# Patient Record
Sex: Female | Born: 1950 | Race: White | Hispanic: No | Marital: Single | Smoking: Former smoker
Health system: Southern US, Community
[De-identification: ages and names within clinical notes are randomized; demographics above are authoritative.]

## PROBLEM LIST (undated history)

## (undated) DIAGNOSIS — F419 Anxiety disorder, unspecified: Secondary | ICD-10-CM

## (undated) DIAGNOSIS — I10 Essential (primary) hypertension: Secondary | ICD-10-CM

## (undated) DIAGNOSIS — M1990 Unspecified osteoarthritis, unspecified site: Secondary | ICD-10-CM

## (undated) DIAGNOSIS — B029 Zoster without complications: Secondary | ICD-10-CM

## (undated) DIAGNOSIS — C801 Malignant (primary) neoplasm, unspecified: Secondary | ICD-10-CM

## (undated) HISTORY — DX: Essential (primary) hypertension: I10

## (undated) HISTORY — PX: CHOLECYSTECTOMY: SHX55

## (undated) HISTORY — DX: Zoster without complications: B02.9

## (undated) HISTORY — DX: Unspecified osteoarthritis, unspecified site: M19.90

## (undated) HISTORY — PX: WRIST SURGERY: SHX841

---

## 2017-03-19 ENCOUNTER — Encounter: Payer: Self-pay | Admitting: Family Medicine

## 2017-04-01 ENCOUNTER — Encounter: Payer: Self-pay | Admitting: Physician Assistant

## 2017-04-01 ENCOUNTER — Ambulatory Visit (INDEPENDENT_AMBULATORY_CARE_PROVIDER_SITE_OTHER): Payer: BC Managed Care – PPO | Admitting: Physician Assistant

## 2017-04-01 VITALS — BP 132/80 | HR 68 | Temp 98.3°F | Ht 66.0 in | Wt 210.4 lb

## 2017-04-01 DIAGNOSIS — R030 Elevated blood-pressure reading, without diagnosis of hypertension: Secondary | ICD-10-CM | POA: Diagnosis not present

## 2017-04-01 DIAGNOSIS — I10 Essential (primary) hypertension: Secondary | ICD-10-CM | POA: Insufficient documentation

## 2017-04-01 NOTE — Patient Instructions (Signed)
In a few days you may receive a survey in the mail or online from [employer] regarding your visit with us today. Please take a moment to fill this out. Your feedback is very important to our whole office. It can help us better understand your needs as well as improve your experience and satisfaction. Thank you for taking your time to complete it. We care about you.  Teala Daffron, PA-C  

## 2017-04-01 NOTE — Progress Notes (Signed)
BP 132/80   Pulse 68   Temp 98.3 F (36.8 C) (Oral)   Wt 210 lb 6.4 oz (95.4 kg)    Subjective:    Patient ID: Anna Carney, female    DOB: 1950/09/07, 66 y.o.   MRN: [id number]  HPI: Anna Carney is a 66 y.o. female presenting on 04/01/2017 for Establish Care (patient had a dot exam last week and bp was elevated)  She comes in today to be established as a new patient. 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 Her BP reading was elevated and will need another one in a year. Her reading is normal today.  She will keep track of any reading in the next few months. She will be seen in 9 months for a check before the DOT.  Relevant past medical, surgical, family and social history reviewed and updated as indicated. Allergies and medications reviewed and updated.  History reviewed. No pertinent past medical history.  Past Surgical History:  Procedure Laterality Date  . CHOLECYSTECTOMY      Review of Systems  Constitutional: Negative.  Negative for activity change, fatigue and fever.  HENT: Negative.   Eyes: Negative.   Respiratory: Negative.  Negative for cough.   Cardiovascular: Negative.  Negative for chest pain.  Gastrointestinal: Negative.  Negative for abdominal pain.  Endocrine: Negative.   Genitourinary: Negative.  Negative for dysuria.  Musculoskeletal: Negative.   Skin: Negative.   Neurological: Negative.     Allergies as of 04/01/2017   No Known Allergies     Medication List       Accurate as of 04/01/17 10:01 PM. Always use your most recent med list.          acetaminophen 650 MG CR tablet Commonly known as:  TYLENOL Take 650 mg by mouth every 8 (eight) hours as needed for pain.   calcium carbonate 1500 (600 Ca) MG Tabs tablet Commonly known as:  OSCAL Take by mouth 2 (two) times daily with a meal.   ketoconazole 2 % cream Commonly known as:  NIZORAL   lidocaine 5 % ointment Commonly known as:  XYLOCAINE Apply 1-2gm TO  KNEES THREE TIMES DAILY   vitamin B-12 1000 MCG tablet Commonly known as:  CYANOCOBALAMIN Take 1,000 mcg by mouth daily.   Vitamin D-3 1000 units Caps Take by mouth.          Objective:    BP 132/80   Pulse 68   Temp 98.3 F (36.8 C) (Oral)   Wt 210 lb 6.4 oz (95.4 kg)   No Known Allergies  Physical Exam  Constitutional: She is oriented to person, place, and time. She appears well-developed and well-nourished.  HENT:  Head: Normocephalic and atraumatic.  Eyes: Pupils are equal, round, and reactive to light. Conjunctivae and EOM are normal.  Cardiovascular: Normal rate, regular rhythm, normal heart sounds and intact distal pulses.   Pulmonary/Chest: Effort normal and breath sounds normal.  Abdominal: Soft. Bowel sounds are normal.  Neurological: She is alert and oriented to person, place, and time. She has normal reflexes.  Skin: Skin is warm and dry. No rash noted.  Psychiatric: She has a normal mood and affect. Her behavior is normal. Judgment and thought content normal.  Nursing note and vitals reviewed.   No results found for this or any previous visit.    Assessment & Plan:   1. Elevated BP without diagnosis of hypertension Healthy eating and walking Recheck 9 months  Call if any elevated readings are seen in next couple of months. Will start lisinopril 5 mg and recheck in a month. Must have BP controlled for DOT next year.    Current Outpatient Prescriptions:  .  acetaminophen (TYLENOL) 650 MG CR tablet, Take 650 mg by mouth every 8 (eight) hours as needed for pain., Disp: , Rfl:  .  calcium carbonate (OSCAL) 1500 (600 Ca) MG TABS tablet, Take by mouth 2 (two) times daily with a meal., Disp: , Rfl:  .  Cholecalciferol (VITAMIN D-3) 1000 units CAPS, Take by mouth., Disp: , Rfl:  .  ketoconazole (NIZORAL) 2 % cream, , Disp: , Rfl:  .  lidocaine (XYLOCAINE) 5 % ointment, Apply 1-2gm TO KNEES THREE TIMES DAILY, Disp: , Rfl: 3 .  vitamin B-12 (CYANOCOBALAMIN) 1000  MCG tablet, Take 1,000 mcg by mouth daily., Disp: , Rfl:  Continue all other maintenance medications as listed above.  Follow up plan: Return in about 9 months (around 12/30/2017) for recheck.  Educational handout given for [place] PA-C [place] [address]  [place], [place] [address] [phone]   04/01/2017, 10:01 PM

## 2017-07-03 DIAGNOSIS — M170 Bilateral primary osteoarthritis of knee: Secondary | ICD-10-CM | POA: Insufficient documentation

## 2018-01-07 ENCOUNTER — Telehealth: Payer: Self-pay | Admitting: Physician Assistant

## 2018-10-18 DIAGNOSIS — C801 Malignant (primary) neoplasm, unspecified: Secondary | ICD-10-CM

## 2018-10-18 HISTORY — DX: Malignant (primary) neoplasm, unspecified: C80.1

## 2018-10-20 ENCOUNTER — Other Ambulatory Visit (HOSPITAL_COMMUNITY): Payer: Self-pay | Admitting: Obstetrics & Gynecology

## 2018-10-20 ENCOUNTER — Encounter: Payer: Self-pay | Admitting: Hematology

## 2018-10-20 ENCOUNTER — Ambulatory Visit (HOSPITAL_COMMUNITY)
Admission: RE | Admit: 2018-10-20 | Discharge: 2018-10-20 | Disposition: A | Discharge status: Home / Self Care | Payer: BC Managed Care – PPO | Source: Ambulatory Visit | Attending: Obstetrics & Gynecology | Admitting: Obstetrics & Gynecology

## 2018-10-20 ENCOUNTER — Encounter: Payer: Self-pay | Admitting: Obstetrics & Gynecology

## 2018-10-20 ENCOUNTER — Ambulatory Visit (INDEPENDENT_AMBULATORY_CARE_PROVIDER_SITE_OTHER): Payer: BC Managed Care – PPO | Admitting: Obstetrics & Gynecology

## 2018-10-20 ENCOUNTER — Encounter: Payer: Self-pay | Admitting: Adult Health

## 2018-10-20 VITALS — BP 161/101 | HR 99 | Ht 64.25 in | Wt 212.5 lb

## 2018-10-20 DIAGNOSIS — C50912 Malignant neoplasm of unspecified site of left female breast: Secondary | ICD-10-CM | POA: Diagnosis not present

## 2018-10-20 DIAGNOSIS — N644 Mastodynia: Secondary | ICD-10-CM

## 2018-10-20 DIAGNOSIS — R928 Other abnormal and inconclusive findings on diagnostic imaging of breast: Secondary | ICD-10-CM | POA: Diagnosis present

## 2018-10-20 DIAGNOSIS — D241 Benign neoplasm of right breast: Secondary | ICD-10-CM | POA: Insufficient documentation

## 2018-10-20 DIAGNOSIS — C50512 Malignant neoplasm of lower-outer quadrant of left female breast: Secondary | ICD-10-CM | POA: Diagnosis not present

## 2018-10-20 MED ORDER — LIDOCAINE HCL (PF) 1 % IJ SOLN
INTRAMUSCULAR | Status: AC
  Filled 2018-10-20: qty 10

## 2018-10-20 MED ORDER — LIDOCAINE-EPINEPHRINE (PF) 1 %-1:200000 IJ SOLN
INTRAMUSCULAR | Status: AC
  Filled 2018-10-20: qty 30

## 2018-10-20 NOTE — Progress Notes (Signed)
Patient ID: Anna Carney, female   DOB: 25-Mar-1951, 68 y.o.   MRN: [id number]      Chief Complaint  Patient presents with  . knot in left breast    knot busted last Wednesday      67 y.o. G1P1001 No LMP recorded. Patient is postmenopausal. The current method of family planning is .  Outpatient Encounter Medications as of 10/20/2018  Medication Sig  . Trolamine Salicylate (ASPERCREME EX) Apply topically every morning.  . [DISCONTINUED] acetaminophen (TYLENOL) 650 MG CR tablet Take 650 mg by mouth every 8 (eight) hours as needed for pain.  . [DISCONTINUED] calcium carbonate (OSCAL) 1500 (600 Ca) MG TABS tablet Take by mouth 2 (two) times daily with a meal.  . [DISCONTINUED] Cholecalciferol (VITAMIN D-3) 1000 units CAPS Take by mouth.  . [DISCONTINUED] ketoconazole (NIZORAL) 2 % cream   . [DISCONTINUED] lidocaine (XYLOCAINE) 5 % ointment Apply 1-2gm TO KNEES THREE TIMES DAILY  . [DISCONTINUED] vitamin B-12 (CYANOCOBALAMIN) 1000 MCG tablet Take 1,000 mcg by mouth daily.   No facility-administered encounter medications on file as of 10/20/2018.     Subjective Pt with mass on breast that popped thru skin last week Has been a mass  there for some time 3 years or so Past Medical History:  Diagnosis Date  . Arthritis   . Shingles     Past Surgical History:  Procedure Laterality Date  . CHOLECYSTECTOMY      OB History    Gravida  1   Para  1   Term  1   Preterm      AB      Living  1     SAB      TAB      Ectopic      Multiple      Live Births  1           No Known Allergies  Social History   Socioeconomic History  . Marital status: Single    Spouse name: Not on file  . Number of children: Not on file  . Years of education: Not on file  . Highest education level: Not on file  Occupational History  . Not on file  Social Needs  . Financial resource strain: Not on file  . Food insecurity:    Worry: Not on file    Inability: Not on file  .  Transportation needs:    Medical: Not on file    Non-medical: Not on file  Tobacco Use  . Smoking status: Former Research scientist (life sciences)  . Smokeless tobacco: Never Used  Substance and Sexual Activity  . Alcohol use: No  . Drug use: No  . Sexual activity: Not Currently    Birth control/protection: Post-menopausal  Lifestyle  . Physical activity:    Days per week: Not on file    Minutes per session: Not on file  . Stress: Not on file  Relationships  . Social connections:    Talks on phone: Not on file    Gets together: Not on file    Attends religious service: Not on file    Active member of club or organization: Not on file    Attends meetings of clubs or organizations: Not on file    Relationship status: Not on file  Other Topics Concern  . Not on file  Social History Narrative  . Not on file    Family History  Problem Relation Age of Onset  . Arthritis Mother   .  Diabetes Mother   . Stroke Mother   . Asthma Father   . Colon cancer Brother   . Arthritis Daughter   . Thyroid disease Daughter     Medications:       Current Outpatient Medications:  .  Trolamine Salicylate (ASPERCREME EX), Apply topically every morning., Disp: , Rfl:   Objective Blood pressure (!) 161/101, pulse 99, height 5' 4.25" (1.632 m), weight 212 lb 8 oz (96.4 kg).  Left breast cancer eroding through skin, adherenet to chest wall involving over half of the breast, lymph nodes are not clincially palpable  Pertinent ROS   Labs or studies     Impression Diagnoses this Encounter::   ICD-10-CM   1. 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   Labs or Scans Ordered: No orders of the defined types were placed in this encounter.   Management:: >core biopsy at [facility] now, then follow up with Dr Arnoldo Morale,  pt aware will follow up ASAP  Follow up Return if symptoms worsen or fail to improve.      All questions were answered.

## 2018-10-21 ENCOUNTER — Encounter: Payer: Self-pay | Admitting: Physician Assistant

## 2018-10-21 ENCOUNTER — Telehealth: Payer: Self-pay | Admitting: *Deleted

## 2018-10-21 ENCOUNTER — Telehealth: Payer: Self-pay | Admitting: Obstetrics & Gynecology

## 2018-10-21 ENCOUNTER — Ambulatory Visit: Payer: BC Managed Care – PPO | Admitting: Physician Assistant

## 2018-10-21 VITALS — BP 132/82 | HR 101 | Temp 98.5°F | Ht 64.25 in | Wt 209.2 lb

## 2018-10-21 DIAGNOSIS — R030 Elevated blood-pressure reading, without diagnosis of hypertension: Secondary | ICD-10-CM

## 2018-10-21 DIAGNOSIS — F064 Anxiety disorder due to known physiological condition: Secondary | ICD-10-CM | POA: Diagnosis not present

## 2018-10-21 DIAGNOSIS — R5382 Chronic fatigue, unspecified: Secondary | ICD-10-CM | POA: Diagnosis not present

## 2018-10-21 DIAGNOSIS — G44209 Tension-type headache, unspecified, not intractable: Secondary | ICD-10-CM

## 2018-10-21 LAB — BAYER DCA HB A1C WAIVED: HB A1C (BAYER DCA - WAIVED): 5.1 % (ref <7.0)

## 2018-10-21 MED ORDER — ALPRAZOLAM 0.25 MG PO TABS: 0.2500 mg | ORAL_TABLET | Freq: Two times a day (BID) | ORAL | 1 refills | Status: DC | PRN

## 2018-10-21 MED ORDER — HYDROCHLOROTHIAZIDE 25 MG PO TABS: 25.0000 mg | ORAL_TABLET | Freq: Every day | ORAL | 3 refills | Status: DC

## 2018-10-21 NOTE — Telephone Encounter (Signed)
Patient's daughter called stating that she would like for Dr. Elonda Husky to prescribe her mother who was diagnosis with Breast cancer an Antibiotic for her moms open wound. Pt's daughter states Flagel is a good antibiotic for open wounds. Pt's mom uses Product/process development scientist in [place]. Please contact pt when done

## 2018-10-21 NOTE — Telephone Encounter (Signed)
Pt requesting note to be excused from work the remainder of the week d/t new diagnosis. Advised that I would get the note faxed over.

## 2018-10-21 NOTE — Telephone Encounter (Signed)
Pt called requesting an antibiotic for the open wound on her breast. Advised that Dr Elonda Husky was out of the office today but I would send him her request. Advised that I would call her back to let her know his recommendations. Pt verbalized understanding.

## 2018-10-21 NOTE — Telephone Encounter (Signed)
Patient's daughter, Jacqlyn Larsen called, stated that the patient will be out of work the rest of this week and would like a note faxed to [phone].  The patient was seen yesterday and was dx w/breast cancer per the daughter.  [phone]

## 2018-10-22 ENCOUNTER — Other Ambulatory Visit: Payer: Self-pay | Admitting: Obstetrics & Gynecology

## 2018-10-22 DIAGNOSIS — G44209 Tension-type headache, unspecified, not intractable: Secondary | ICD-10-CM | POA: Insufficient documentation

## 2018-10-22 DIAGNOSIS — F064 Anxiety disorder due to known physiological condition: Secondary | ICD-10-CM | POA: Insufficient documentation

## 2018-10-22 DIAGNOSIS — R5382 Chronic fatigue, unspecified: Secondary | ICD-10-CM | POA: Insufficient documentation

## 2018-10-22 LAB — CBC WITH DIFFERENTIAL/PLATELET
Basophils Absolute: 0.1 10*3/uL (ref 0.0–0.2)
Basos: 1 %
EOS (ABSOLUTE): 0.2 10*3/uL (ref 0.0–0.4)
Eos: 2 %
Hematocrit: 40.1 % (ref 34.0–46.6)
Hemoglobin: 13.8 g/dL (ref 11.1–15.9)
Immature Grans (Abs): 0 10*3/uL (ref 0.0–0.1)
Immature Granulocytes: 0 %
Lymphocytes Absolute: 1.6 10*3/uL (ref 0.7–3.1)
Lymphs: 24 %
MCH: 31.1 pg (ref 26.6–33.0)
MCHC: 34.4 g/dL (ref 31.5–35.7)
MCV: 90 fL (ref 79–97)
Monocytes Absolute: 0.7 10*3/uL (ref 0.1–0.9)
Monocytes: 10 %
Neutrophils Absolute: 4.2 10*3/uL (ref 1.4–7.0)
Neutrophils: 63 %
Platelets: 245 10*3/uL (ref 150–450)
RBC: 4.44 x10E6/uL (ref 3.77–5.28)
RDW: 11.8 % (ref 11.7–15.4)
WBC: 6.7 10*3/uL (ref 3.4–10.8)

## 2018-10-22 LAB — CMP14+EGFR
ALT: 14 IU/L (ref 0–32)
AST: 21 IU/L (ref 0–40)
Albumin/Globulin Ratio: 1.5 (ref 1.2–2.2)
Albumin: 4 g/dL (ref 3.8–4.8)
Alkaline Phosphatase: 80 IU/L (ref 39–117)
BILIRUBIN TOTAL: 0.4 mg/dL (ref 0.0–1.2)
BUN/Creatinine Ratio: 22 (ref 12–28)
BUN: 14 mg/dL (ref 8–27)
CHLORIDE: 106 mmol/L (ref 96–106)
CO2: 23 mmol/L (ref 20–29)
Calcium: 9.3 mg/dL (ref 8.7–10.3)
Creatinine, Ser: 0.65 mg/dL (ref 0.57–1.00)
GFR calc Af Amer: 108 mL/min/{1.73_m2} (ref >59)
GFR calc non Af Amer: 93 mL/min/{1.73_m2} (ref >59)
GLUCOSE: 105 mg/dL — AB (ref 65–99)
Globulin, Total: 2.6 g/dL (ref 1.5–4.5)
Potassium: 3.9 mmol/L (ref 3.5–5.2)
Sodium: 144 mmol/L (ref 134–144)
Total Protein: 6.6 g/dL (ref 6.0–8.5)

## 2018-10-22 LAB — THYROID PANEL WITH TSH
Free Thyroxine Index: 3.1 (ref 1.2–4.9)
T3 Uptake Ratio: 33 % (ref 24–39)
T4, Total: 9.5 ug/dL (ref 4.5–12.0)
TSH: 0.416 u[IU]/mL — AB (ref 0.450–4.500)

## 2018-10-22 MED ORDER — SILVER SULFADIAZINE 1 % EX CREA: 1.0000 "application " | TOPICAL_CREAM | Freq: Every day | CUTANEOUS | 11 refills | Status: DC

## 2018-10-22 NOTE — Telephone Encounter (Signed)
Informed pt that silvadene cream was sent to pharmacy. Advised that an antibiotic was not indicated at this time. Pt verbalized understanding.

## 2018-10-22 NOTE — Progress Notes (Signed)
BP 132/82   Pulse (!) 101   Temp 98.5 F (36.9 C) (Oral)   Ht 5' 4.25" (1.632 m)   Wt 209 lb 3.2 oz (94.9 kg)   BMI 35.63 kg/m    Subjective:    Patient ID: Anna Carney, female    DOB: 10-01-1950, 68 y.o.   MRN: [id number]  HPI: Anna Carney is a 68 y.o. female presenting on 10/21/2018 for Hypertension (Bp elevated at Dr. Elonda Husky office )  She has been undergoing testing for breast cancer.  She will be getting the final answer in a few days. On the day of the biopsy her BP was quite elevated but has been better at home.  She was mildly elevated in our office. She has never had to take treatment for her BP.  She is anxious over everything that is going on. She does not take anything for depression or anxiety. We will over a limited amount of low dose xanax to be used as needed, her daughters were with her.  Past Medical History:  Diagnosis Date  . Arthritis   . Shingles    Relevant past medical, surgical, family and social history reviewed and updated as indicated. Interim medical history since our last visit reviewed. Allergies and medications reviewed and updated. DATA REVIEWED: CHART IN EPIC  Family History reviewed for pertinent findings.  Review of Systems  Constitutional: Negative.   HENT: Negative.   Eyes: Negative.   Respiratory: Negative.   Cardiovascular: Negative.  Negative for chest pain, palpitations and leg swelling.  Gastrointestinal: Negative.   Genitourinary: Negative.     Allergies as of 10/21/2018   No Known Allergies     Medication List       Accurate as of October 21, 2018 11:59 PM. Always use your most recent med list.        ALPRAZolam 0.25 MG tablet Commonly known as:  XANAX Take 1 tablet (0.25 mg total) by mouth 2 (two) times daily as needed for anxiety.   ASPERCREME EX Apply topically every morning.   hydrochlorothiazide 25 MG tablet Commonly known as:  HYDRODIURIL Take 1 tablet (25 mg total) by mouth daily.          Objective:      BP 132/82   Pulse (!) 101   Temp 98.5 F (36.9 C) (Oral)   Ht 5' 4.25" (1.632 m)   Wt 209 lb 3.2 oz (94.9 kg)   BMI 35.63 kg/m   No Known Allergies  Wt Readings from Last 3 Encounters:  10/21/18 209 lb 3.2 oz (94.9 kg)  10/20/18 212 lb 8 oz (96.4 kg)  04/01/17 210 lb 6.4 oz (95.4 kg)    Physical Exam Constitutional:      Appearance: She is well-developed.  HENT:     Head: Normocephalic and atraumatic.  Eyes:     Conjunctiva/sclera: Conjunctivae normal.     Pupils: Pupils are equal, round, and reactive to light.  Cardiovascular:     Rate and Rhythm: Normal rate and regular rhythm.     Heart sounds: Normal heart sounds.  Pulmonary:     Effort: Pulmonary effort is normal.     Breath sounds: Normal breath sounds.  Abdominal:     General: Bowel sounds are normal.     Palpations: Abdomen is soft.  Skin:    General: Skin is warm and dry.     Findings: No rash.  Neurological:     Mental Status: She is alert and oriented to person,  place, and time.     Deep Tendon Reflexes: Reflexes are normal and symmetric.  Psychiatric:        Behavior: Behavior normal.        Thought Content: Thought content normal.        Judgment: Judgment normal.         Assessment & Plan:   1. Elevated BP without diagnosis of hypertension - CMP14+EGFR - CBC with Differential/Platelet - Thyroid Panel With TSH - Bayer DCA Hb A1c Waived - hydrochlorothiazide (HYDRODIURIL) 25 MG tablet; Take 1 tablet (25 mg total) by mouth daily.  Dispense: 90 tablet; Refill: 3  2. Chronic fatigue - CMP14+EGFR - CBC with Differential/Platelet - Thyroid Panel With TSH - Bayer DCA Hb A1c Waived  3. Acute non intractable tension-type headache - CMP14+EGFR - CBC with Differential/Platelet - Thyroid Panel With TSH - Bayer DCA Hb A1c Waived  4. Anxiety disorder due to medical condition - ALPRAZolam (XANAX) 0.25 MG tablet; Take 1 tablet (0.25 mg total) by mouth 2 (two) times daily as needed for anxiety.   Dispense: 20 tablet; Refill: 1   Continue all other maintenance medications as listed above.  Follow up plan: No follow-ups on file.  Educational handout given for [place] PA-C [place] [address]  [place], [place] [address] [phone]   10/22/2018, 9:18 PM

## 2018-10-22 NOTE — Telephone Encounter (Signed)
No systemic antibiotic is needed or indicated  Topical silvadene will be a proper solution to the open skin

## 2018-10-26 ENCOUNTER — Telehealth: Payer: Self-pay | Admitting: Obstetrics & Gynecology

## 2018-10-26 ENCOUNTER — Other Ambulatory Visit: Payer: Self-pay | Admitting: *Deleted

## 2018-10-26 ENCOUNTER — Encounter: Payer: Self-pay | Admitting: Obstetrics & Gynecology

## 2018-10-26 DIAGNOSIS — C50912 Malignant neoplasm of unspecified site of left female breast: Secondary | ICD-10-CM

## 2018-10-26 NOTE — Telephone Encounter (Signed)
done

## 2018-10-26 NOTE — Telephone Encounter (Signed)
Patient's Daughter called stating that she needs another work note faxed to her at [phone]. Patient's daughter states that she is going to be out today and tomorrow. Please contact pt's daughter

## 2018-10-27 ENCOUNTER — Other Ambulatory Visit: Payer: Self-pay

## 2018-10-27 ENCOUNTER — Encounter (HOSPITAL_COMMUNITY): Payer: Self-pay | Admitting: Hematology

## 2018-10-27 ENCOUNTER — Encounter: Payer: Self-pay | Admitting: Women's Health

## 2018-10-27 ENCOUNTER — Encounter (HOSPITAL_COMMUNITY): Payer: Self-pay | Admitting: *Deleted

## 2018-10-27 ENCOUNTER — Inpatient Hospital Stay (HOSPITAL_COMMUNITY): Payer: BC Managed Care – PPO | Attending: Hematology | Admitting: Hematology

## 2018-10-27 VITALS — BP 158/90 | HR 122 | Temp 98.2°F | Resp 16 | Ht 64.0 in | Wt 208.2 lb

## 2018-10-27 DIAGNOSIS — Z170 Estrogen receptor positive status [ER+]: Secondary | ICD-10-CM | POA: Diagnosis not present

## 2018-10-27 DIAGNOSIS — C50512 Malignant neoplasm of lower-outer quadrant of left female breast: Secondary | ICD-10-CM

## 2018-10-27 DIAGNOSIS — F064 Anxiety disorder due to known physiological condition: Secondary | ICD-10-CM

## 2018-10-27 NOTE — Assessment & Plan Note (Signed)
1.  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 Elonda Husky. -Mammogram/ultrasound on 10/20/2018 showed irregular hypoechoic mass at 12:30 position in the right breast, measuring 1.5 x 0.5 x 3.1 cm.  Left breast shows very large mass involving the entire central/retroareolar left breast and lower outer quadrant.  Mass could not fit entirely in the field-of-view.  Retroareolar location measured 4.7 x 2.6 x 5.3 cm.  A small satellite nodule is identified measuring approximately 0.7 cm.  There is an indeterminate lymph node with slight nodular cortical thickening in the left axilla with cortical thickness measuring up to 0.4 cm. - Biopsy of the left breast on 10/20/2018 consistent with invasive ductal carcinoma, left axillary lymph node core biopsy negative for carcinoma. - ER/PR was 100%, Ki-67 of 10%.  HER-2 was 2+.  FISH is pending. 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 -Had a prolonged discussion with the patient and her 2 daughters about pathology report.  She does not report any recent weight loss.  No new onset pains. -I have recommended a PET scan for staging purposes.  We will also call pathology and obtain HER-2 FISH results. - We will send her to Dr. Donne Hazel for surgical opinion. -I will see her back after the PET CT scan to discuss results.  I will go ahead and order Oncotype DX test on the biopsy which will help in deciding her neoadjuvant chemotherapy if she has a high recurrence score and no evidence of metastatic disease.  2.  Family history: -Maternal aunt had breast cancer.  Maternal half brother died of colon cancer.

## 2018-10-27 NOTE — Patient Instructions (Signed)
[facility] Discharge Instructions     Thank you for choosing [facility] at [facility] to provide your oncology and hematology care.  To afford each patient quality time with our provider, please arrive at least 15 minutes before your scheduled appointment time.   If you have a lab appointment with the Cancer Center please come in thru the  Main Entrance and check in at the main information desk  You need to re-schedule your appointment should you arrive 10 or more minutes late.  We strive to give you quality time with our providers, and arriving late affects you and other patients whose appointments are after yours.  Also, if you no show three or more times for appointments you may be dismissed from the clinic at the providers discretion.     Again, thank you for choosing [facility].  Our hope is that these requests will decrease the amount of time that you wait before being seen by our physicians.       _____________________________________________________________  Should you have questions after your visit to [facility], please contact our office at [phone] between the hours of 8:00 a.m. and 4:30 p.m.  Voicemails left after 4:00 p.m. will not be returned until the following business day.  For prescription refill requests, have your pharmacy contact our office and allow 72 hours.    Cancer Center Support Programs:   > Cancer Support Group  2nd Tuesday of the month 1pm-2pm, Journey Room    

## 2018-10-27 NOTE — Progress Notes (Signed)
I attempted to call patient today to introduce myself.  I left her a voicemail with my contact information and asked that she return my call.

## 2018-10-27 NOTE — Progress Notes (Signed)
Oncology Navigator Note:  Patient was referred to our office for new diagnosis of breast cancer. I met with patient today during her visit with Dr. Delton Coombes. She was accompanied by her daughters, Jacqlyn Larsen and Sharyn Lull.  I explained to them how I will be involved with her care.  My phone number and all contact information was provided so that she can call me with any questions or concerns.  She voices appreciation and understanding.

## 2018-10-27 NOTE — Progress Notes (Signed)
[facility] NOTE  Patient Care Team: Theodoro Clock as PCP - General (Physician Assistant)  CHIEF COMPLAINTS/PURPOSE OF CONSULTATION: Newly diagnosed breast cancer  HISTORY OF PRESENTING ILLNESS:  Anna Carney 68 y.o. female is here because of recent diagnosis of left breast cancer. She reports she has felt a nodule on her left breast for the last 3 years. She never had it checked. She recently had a mammogram on 10/20/2018.  It showed masses in her right and left breast and sent her for a biopsy. She had her biopsy on 10/20/2018. She reports no significant weight loss. She is currently having pain in her breast that she feels is more from the biospy. Denies any nausea, vomiting, or diarrhea. Had not noticed any recent bleeding such as epistaxis, hematuria or hematochezia. Denies recent chest pain on exertion, shortness of breath on minimal exertion, pre-syncopal episodes, or palpitations. Denies any numbness or tingling in hands or feet. Denies any recent fevers, infections, or recent hospitalizations. Patient reports appetite at 75% and energy level at 50%. She is eating well and maintaining his weight at this time.  She reports a maternal aunt with breast cancer. She also had a maternal half brother with colon cancer.  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 She also works part time in [employer].  She has lived with her boyfriend for the past 65 years. She is full functioning and is able to perform all her own ADLs and activities. She is able to handle her own finances and drive herself to appointments.   I reviewed her records extensively and collaborated the history with the patient.  In terms of breast cancer risk profile:  She menarched at early age of 24 and went to menopause at age 26 She had 1 pregnancy, her first child was born at age 81 She never received birth control  pills.  She was never exposed to fertility medications or hormone replacement therapy.  She has postive family history of Breast/GYN/GI cancer  MEDICAL HISTORY:  Past Medical History:  Diagnosis Date  . Arthritis   . Shingles     SURGICAL HISTORY: Past Surgical History:  Procedure Laterality Date  . CHOLECYSTECTOMY      SOCIAL HISTORY: Social History   Socioeconomic History  . Marital status: Single    Spouse name: Not on file  . Number of children: Not on file  . Years of education: Not on file  . Highest education level: Not on file  Occupational History  . Occupation: [employer]    Comment: in garden center  . 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 Financial resource strain: Not hard at all  . Food insecurity:    Worry: Never true    Inability: Never true  . Transportation needs:    Medical: No    Non-medical: No  Tobacco Use  . Smoking status: Former Research scientist (life sciences)  . Smokeless tobacco: Never Used  Substance and Sexual Activity  . Alcohol use: No  . Drug use: No  . Sexual activity: Not Currently    Birth control/protection: Post-menopausal  Lifestyle  . Physical activity:    Days per week: Not on file    Minutes per session: Not on file  . Stress: Rather much  Relationships  . Social connections:    Talks on  phone: Not on file    Gets together: Not on file    Attends religious service: Not on file    Active member of club or organization: Not on file    Attends meetings of clubs or organizations: Not on file    Relationship status: Not on file  . Intimate partner violence:    Fear of current or ex partner: Not on file    Emotionally abused: Not on file    Physically abused: Not on file    Forced sexual activity: Not on file  Other Topics Concern  . Not on file  Social History Narrative  . Not on file    FAMILY HISTORY: Family History  Problem Relation Age of Onset  . Arthritis Mother    . Diabetes Mother   . Stroke Mother   . Asthma Father   . Colon cancer Brother   . Arthritis Daughter   . Thyroid disease Daughter   . Breast cancer Maternal Aunt     ALLERGIES:  has No Known Allergies.  MEDICATIONS:  Current Outpatient Medications  Medication Sig Dispense Refill  . ALPRAZolam (XANAX) 0.25 MG tablet Take 1 tablet (0.25 mg total) by mouth 2 (two) times daily as needed for anxiety. 20 tablet 1  . Ascorbic Acid (VITAMIN C) 1000 MG tablet Take 2,000 mg by mouth daily.    . cholecalciferol (VITAMIN D3) 25 MCG (1000 UT) tablet Take 2,000 Units by mouth daily.    . Multiple Vitamin (MULTIVITAMIN) tablet Take 1 tablet by mouth daily.    . silver sulfADIAZINE (SILVADENE) 1 % cream Apply 1 application topically daily. Thin layer to area twice daily 50 g 11  . Trolamine Salicylate (ASPERCREME EX) Apply topically as needed.     . hydrochlorothiazide (HYDRODIURIL) 25 MG tablet Take 1 tablet (25 mg total) by mouth daily. (Patient not taking: Reported on 10/27/2018) 90 tablet 3   No current facility-administered medications for this visit.     REVIEW OF SYSTEMS:   Constitutional: Denies fevers, chills or abnormal night sweats Eyes: Denies blurriness of vision, double vision or watery eyes Ears, nose, mouth, throat, and face: Denies mucositis or sore throat Respiratory: Denies cough, dyspnea or wheezes Cardiovascular: Denies palpitation, chest discomfort or lower extremity swelling Gastrointestinal:  Denies nausea, heartburn or change in bowel habits Skin: Denies abnormal skin rashes Lymphatics: Denies new lymphadenopathy or easy bruising Neurological:Denies numbness, tingling or new weaknesses Behavioral/Psych: Mood is stable, no new changes  Breast: +left and right breast mass. All other systems were reviewed with the patient and are negative.  PHYSICAL EXAMINATION: ECOG PERFORMANCE STATUS: 1 - Symptomatic but completely ambulatory  Vitals:   10/27/18 1308  BP: (!)  158/90  Pulse: (!) 122  Resp: 16  Temp: 98.2 F (36.8 C)  SpO2: 96%   Filed Weights   10/27/18 1308  Weight: 208 lb 3.2 oz (94.4 kg)    GENERAL:alert, no distress and comfortable SKIN: skin color, texture, turgor are normal, no rashes or significant lesions EYES: normal, conjunctiva are pink and non-injected, sclera clear OROPHARYNX:no exudate, no erythema and lips, buccal mucosa, and tongue normal  NECK: supple, thyroid normal size, non-tender, without nodularity LYMPH:  no palpable lymphadenopathy in the cervical, axillary or inguinal LUNGS: clear to auscultation and percussion with normal breathing effort HEART: regular rate & rhythm and no murmurs and no lower extremity edema ABDOMEN:abdomen soft, non-tender and normal bowel sounds Musculoskeletal:no cyanosis of digits and no clubbing  PSYCH: alert & oriented x 3  with fluent speech NEURO: no focal motor/sensory deficits BREAST: +left and right breast mass. (exam performed in the presence of a chaperone)  Fungating left breast mass in the lower outer quadrant.  There is also breast mass palpable behind the areola and in the lower inner quadrant.  No palpable axillary lymph nodes.  Right breast has a palpable mass measuring around 3 cm.  LABORATORY DATA:  I have reviewed the data as listed Lab Results  Component Value Date   WBC 6.7 10/21/2018   HGB 13.8 10/21/2018   HCT 40.1 10/21/2018   MCV 90 10/21/2018   PLT 245 10/21/2018   Lab Results  Component Value Date   NA 144 10/21/2018   K 3.9 10/21/2018   CL 106 10/21/2018   CO2 23 10/21/2018    RADIOGRAPHIC STUDIES: I have personally reviewed the radiological reports and agreed with the findings in the report. I have reviewed Francene Finders, NP's note and agree with the documentation.  I personally performed a face-to-face visit, made revisions and my assessment and plan is as follows.  ASSESSMENT AND PLAN:  Malignant neoplasm of lower-outer quadrant of left breast of  female, estrogen receptor positive ([place]) 1.  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 Elonda Husky. -Mammogram/ultrasound on 10/20/2018 showed irregular hypoechoic mass at 12:30 position in the right breast, measuring 1.5 x 0.5 x 3.1 cm.  Left breast shows very large mass involving the entire central/retroareolar left breast and lower outer quadrant.  Mass could not fit entirely in the field-of-view.  Retroareolar location measured 4.7 x 2.6 x 5.3 cm.  A small satellite nodule is identified measuring approximately 0.7 cm.  There is an indeterminate lymph node with slight nodular cortical thickening in the left axilla with cortical thickness measuring up to 0.4 cm. - Biopsy of the left breast on 10/20/2018 consistent with invasive ductal carcinoma, left axillary lymph node core biopsy negative for carcinoma. - ER/PR was 100%, Ki-67 of 10%.  HER-2 was 2+.  FISH is pending. 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 -Had a prolonged discussion with the patient and her 2 daughters about pathology report.  She does not report any recent weight loss.  No new onset pains. -I have recommended a PET scan for staging purposes.  We will also call pathology and obtain HER-2 FISH results. - We will send her to Dr. Donne Hazel for surgical opinion. -I will see her back after the PET CT scan to discuss results.  I will go ahead and order Oncotype DX test on the biopsy which will help in deciding her neoadjuvant chemotherapy if she has a high recurrence score and no evidence of metastatic disease.  2.  Family history: -Maternal aunt had breast cancer.  Maternal half brother died of colon cancer.   All questions were answered. The patient knows to call the clinic with any problems, questions or concerns.    Derek Jack, MD 10/27/18

## 2018-10-28 ENCOUNTER — Encounter (HOSPITAL_COMMUNITY): Payer: Self-pay | Admitting: Lab

## 2018-10-28 ENCOUNTER — Encounter: Payer: Self-pay | Admitting: General Practice

## 2018-10-28 NOTE — Progress Notes (Signed)
Forestine Na CSW Psychosocial Distress Screening Clinical Social Work  Clinical Social Work was referred by distress screening protocol.  The patient scored a 5 on the Psychosocial Distress Thermometer which indicates moderate distress. Clinical Social Worker contacted patient by phone to assess for distress and other psychosocial needs.  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  Experienced significant anxiety at diagnosis, CSW discussed and normalized anxiety in context of recent diagnosis - patients generally experience decreased anxiety once treatment plan is in place and initial testing finishes.  Has strong support from daughters and faith, "I have prayed a lot."  Reviewed supportive services available at [facility] and [facility], encouraged her to connect as needed.     ONCBCN DISTRESS SCREENING 10/27/2018  Screening Type Initial Screening  Distress experienced in past week (1-10) 5  Emotional problem type Nervousness/Anxiety;Adjusting to illness  Information Concerns Type Lack of info about diagnosis;Lack of info about treatment;Lack of info about complementary therapy choices    Clinical Social Worker follow up needed: No.  If yes, follow up plan:  Beverely Pace, [place], LCSW Clinical Social Worker Phone:  [phone]

## 2018-10-28 NOTE — Progress Notes (Unsigned)
Referral sent to [facility] Dr Donne Hazel. Records faxed on 3/11

## 2018-11-02 ENCOUNTER — Other Ambulatory Visit (HOSPITAL_COMMUNITY): Payer: Self-pay | Admitting: Hematology

## 2018-11-03 ENCOUNTER — Ambulatory Visit (HOSPITAL_COMMUNITY)
Admission: RE | Admit: 2018-11-03 | Discharge: 2018-11-03 | Disposition: A | Discharge status: Home / Self Care | Payer: BC Managed Care – PPO | Source: Ambulatory Visit | Attending: Nurse Practitioner | Admitting: Nurse Practitioner

## 2018-11-03 ENCOUNTER — Other Ambulatory Visit: Payer: Self-pay

## 2018-11-03 ENCOUNTER — Other Ambulatory Visit: Payer: Self-pay | Admitting: General Surgery

## 2018-11-03 DIAGNOSIS — Z170 Estrogen receptor positive status [ER+]: Secondary | ICD-10-CM | POA: Diagnosis present

## 2018-11-03 DIAGNOSIS — C50512 Malignant neoplasm of lower-outer quadrant of left female breast: Secondary | ICD-10-CM

## 2018-11-03 LAB — GLUCOSE, CAPILLARY: Glucose-Capillary: 95 mg/dL (ref 70–99)

## 2018-11-03 MED ORDER — FLUDEOXYGLUCOSE F - 18 (FDG) INJECTION
11.6100 | Freq: Once | INTRAVENOUS | Status: AC
  Administered 2018-11-03: 11.61 via INTRAVENOUS

## 2018-11-04 ENCOUNTER — Encounter (HOSPITAL_COMMUNITY): Payer: Self-pay | Admitting: Hematology

## 2018-11-04 ENCOUNTER — Inpatient Hospital Stay (HOSPITAL_COMMUNITY): Payer: BC Managed Care – PPO | Admitting: Hematology

## 2018-11-04 DIAGNOSIS — C50512 Malignant neoplasm of lower-outer quadrant of left female breast: Secondary | ICD-10-CM | POA: Diagnosis not present

## 2018-11-04 DIAGNOSIS — Z170 Estrogen receptor positive status [ER+]: Secondary | ICD-10-CM

## 2018-11-04 NOTE — Patient Instructions (Addendum)
[place] at [facility] Discharge Instructions  You were seen today by Dr. Delton Coombes. He went over your recent lab and scan results. They were both negative. He will see you back in 4 weeks after your surgery for follow up.   Thank you for choosing [place] at [facility] to provide your oncology and hematology care.  To afford each patient quality time with our provider, please arrive at least 15 minutes before your scheduled appointment time.   If you have a lab appointment with the [facility] please come in thru the  Main Entrance and check in at the main information desk  You need to re-schedule your appointment should you arrive 10 or more minutes late.  We strive to give you quality time with our providers, and arriving late affects you and other patients whose appointments are after yours.  Also, if you no show three or more times for appointments you may be dismissed from the clinic at the providers discretion.     Again, thank you for choosing [facility].  Our hope is that these requests will decrease the amount of time that you wait before being seen by our physicians.       _____________________________________________________________  Should you have questions after your visit to [facility], please contact our office at [phone] between the hours of 8:00 a.m. and 4:30 p.m.  Voicemails left after 4:00 p.m. will not be returned until the following business day.  For prescription refill requests, have your pharmacy contact our office and allow 72 hours.    Cancer Center Support Programs:   > Cancer Support Group  2nd Tuesday of the month 1pm-2pm, Journey Room

## 2018-11-04 NOTE — Assessment & Plan Note (Signed)
1.  Stage III (T4N0) fungating left breast infiltrating ductal carcinoma: -Patient with a known breast mass for the past 3 years, noticed skin breakdown about 2 weeks ago and was evaluated by Dr. Elonda Husky. -Mammogram/ultrasound on 10/20/2018 showed irregular hypoechoic mass at 12:30 position in the right breast, measuring 1.5 x 0.5 x 3.1 cm.  Left breast shows very large mass involving the entire central/retroareolar left breast and lower outer quadrant.  Mass could not fit entirely in the field-of-view.  Retroareolar location measured 4.7 x 2.6 x 5.3 cm.  A small satellite nodule is identified measuring approximately 0.7 cm.  There is an indeterminate lymph node with slight nodular cortical thickening in the left axilla with cortical thickness measuring up to 0.4 cm. - Biopsy of the left breast on 10/20/2018 showed invasive ductal carcinoma, ER/PR 100%, Ki-67 10%, HER-2 2+ by IHC and HER-2 negative by FISH.  Left axillary lymph node core biopsy was negative for carcinoma.  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  -We discussed the results of the PET CT scan dated 11/03/2018 which shows large hypermetabolic left breast mass with no evidence of axillary or thoracic nodal metastasis.  No distant metastatic disease. - I have ordered Oncotype DX testing which was pending. -I have contacted Dr. Donne Hazel who will schedule her for a left mastectomy and lymph node biopsy. - I will see her back in 3 to 4 weeks after the surgery to discuss pathology.  2.  Family history: -Maternal aunt had breast cancer.  Maternal half brother died of colon cancer.

## 2018-11-04 NOTE — Progress Notes (Signed)
[place], [place] [address]   CLINIC:  Medical Oncology/Hematology  PCP:  Terald Sleeper, PA-C [place] [address] [phone]   REASON FOR VISIT:  Follow-up for  Newly diagnosed breast cancer     INTERVAL HISTORY:  Anna Carney 68 y.o. female returns for routine follow-up. She is here today with her daughter. She states that she has gotten in touch with the surgeon and is awaiting appointment. Denies any nausea, vomiting, or diarrhea. Denies any new pains. Had not noticed any recent bleeding such as epistaxis, hematuria or hematochezia. Denies recent chest pain on exertion, shortness of breath on minimal exertion, pre-syncopal episodes, or palpitations. Denies any numbness or tingling in hands or feet. Denies any recent fevers, infections, or recent hospitalizations. Patient reports appetite at 75% and energy level at 50%.    REVIEW OF SYSTEMS:  Review of Systems  All other systems reviewed and are negative.    PAST MEDICAL/SURGICAL HISTORY:  Past Medical History:  Diagnosis Date  . Arthritis   . Shingles    Past Surgical History:  Procedure Laterality Date  . CHOLECYSTECTOMY       SOCIAL HISTORY:  Social History   Socioeconomic History  . Marital status: Single    Spouse name: Not on file  . Number of children: Not on file  . Years of education: Not on file  . Highest education level: Not on file  Occupational History  . Occupation: [employer]    Comment: in garden center  . 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 Financial resource strain: Not hard at all  . Food insecurity:    Worry: Never true    Inability: Never true  . Transportation needs:    Medical: No    Non-medical: No  Tobacco Use  . Smoking status: Former Research scientist (life sciences)  . Smokeless tobacco: Never Used  Substance and Sexual Activity  . Alcohol use: No  . Drug use: No  . Sexual  activity: Not Currently    Birth control/protection: Post-menopausal  Lifestyle  . Physical activity:    Days per week: Not on file    Minutes per session: Not on file  . Stress: Rather much  Relationships  . Social connections:    Talks on phone: Not on file    Gets together: Not on file    Attends religious service: Not on file    Active member of club or organization: Not on file    Attends meetings of clubs or organizations: Not on file    Relationship status: Not on file  . Intimate partner violence:    Fear of current or ex partner: Not on file    Emotionally abused: Not on file    Physically abused: Not on file    Forced sexual activity: Not on file  Other Topics Concern  . Not on file  Social History Narrative  . Not on file    FAMILY HISTORY:  Family History  Problem Relation Age of Onset  . Arthritis Mother   . Diabetes Mother   . Stroke Mother   . Asthma Father   . Colon cancer Brother   . Arthritis Daughter   . Thyroid disease Daughter   . Breast cancer Maternal Aunt     CURRENT MEDICATIONS:  Outpatient Encounter Medications as of 11/04/2018  Medication Sig  . ALPRAZolam (XANAX) 0.25 MG tablet  Take 1 tablet (0.25 mg total) by mouth 2 (two) times daily as needed for anxiety.  . Ascorbic Acid (VITAMIN C) 1000 MG tablet Take 2,000 mg by mouth daily.  . cholecalciferol (VITAMIN D3) 25 MCG (1000 UT) tablet Take 2,000 Units by mouth daily.  . Multiple Vitamin (MULTIVITAMIN) tablet Take 1 tablet by mouth daily.  . silver sulfADIAZINE (SILVADENE) 1 % cream Apply 1 application topically daily. Thin layer to area twice daily  . Trolamine Salicylate (ASPERCREME EX) Apply topically as needed.   . hydrochlorothiazide (HYDRODIURIL) 25 MG tablet Take 1 tablet (25 mg total) by mouth daily. (Patient not taking: Reported on 10/27/2018)   No facility-administered encounter medications on file as of 11/04/2018.     ALLERGIES:  No Known Allergies   PHYSICAL EXAM:  ECOG  Performance status: 1  Vitals:   11/04/18 1346  BP: (!) 148/82  Pulse: 80  Resp: 17  Temp: 98 F (36.7 C)  SpO2: 96%   Filed Weights   11/04/18 1346  Weight: 208 lb (94.3 kg)    Physical Exam Constitutional:      Appearance: Normal appearance.  Cardiovascular:     Rate and Rhythm: Normal rate and regular rhythm.  Pulmonary:     Effort: Pulmonary effort is normal.     Breath sounds: Normal breath sounds.  Skin:    General: Skin is warm.  Neurological:     General: No focal deficit present.     Mental Status: She is alert and oriented to person, place, and time.  Psychiatric:        Mood and Affect: Mood normal.        Behavior: Behavior normal.      LABORATORY DATA:  I have reviewed the labs as listed.  CBC    Component Value Date/Time   WBC 6.7 10/21/2018 1657   RBC 4.44 10/21/2018 1657   HGB 13.8 10/21/2018 1657   HCT 40.1 10/21/2018 1657   PLT 245 10/21/2018 1657   MCV 90 10/21/2018 1657   MCH 31.1 10/21/2018 1657   MCHC 34.4 10/21/2018 1657   RDW 11.8 10/21/2018 1657   LYMPHSABS 1.6 10/21/2018 1657   EOSABS 0.2 10/21/2018 1657   BASOSABS 0.1 10/21/2018 1657   CMP Latest Ref Rng & Units 10/21/2018  Glucose 65 - 99 mg/dL 105(H)  BUN 8 - 27 mg/dL 14  Creatinine 0.57 - 1.00 mg/dL 0.65  Sodium 134 - 144 mmol/L 144  Potassium 3.5 - 5.2 mmol/L 3.9  Chloride 96 - 106 mmol/L 106  CO2 20 - 29 mmol/L 23  Calcium 8.7 - 10.3 mg/dL 9.3  Total Protein 6.0 - 8.5 g/dL 6.6  Total Bilirubin 0.0 - 1.2 mg/dL 0.4  Alkaline Phos 39 - 117 IU/L 80  AST 0 - 40 IU/L 21  ALT 0 - 32 IU/L 14       DIAGNOSTIC IMAGING:  I have independently reviewed the scans and discussed with the patient.   I have reviewed Venita Lick LPN's note and agree with the documentation.  I personally performed a face-to-face visit, made revisions and my assessment and plan is as follows.    ASSESSMENT & PLAN:   Malignant neoplasm of lower-outer quadrant of left breast of female,  estrogen receptor positive ([place]) 1.  Stage III (T4N0) fungating left breast infiltrating ductal carcinoma: -Patient with a known breast mass for the past 3 years, noticed skin breakdown about 2 weeks ago and was evaluated by Dr. Elonda Husky. -Mammogram/ultrasound on 10/20/2018 showed irregular  hypoechoic mass at 12:30 position in the right breast, measuring 1.5 x 0.5 x 3.1 cm.  Left breast shows very large mass involving the entire central/retroareolar left breast and lower outer quadrant.  Mass could not fit entirely in the field-of-view.  Retroareolar location measured 4.7 x 2.6 x 5.3 cm.  A small satellite nodule is identified measuring approximately 0.7 cm.  There is an indeterminate lymph node with slight nodular cortical thickening in the left axilla with cortical thickness measuring up to 0.4 cm. - Biopsy of the left breast on 10/20/2018 showed invasive ductal carcinoma, ER/PR 100%, Ki-67 10%, HER-2 2+ by IHC and HER-2 negative by FISH.  Left axillary lymph node core biopsy was negative for carcinoma.  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  -We discussed the results of the PET CT scan dated 11/03/2018 which shows large hypermetabolic left breast mass with no evidence of axillary or thoracic nodal metastasis.  No distant metastatic disease. - I have ordered Oncotype DX testing which was pending. -I have contacted Dr. Donne Hazel who will schedule her for a left mastectomy and lymph node biopsy. - I will see her back in 3 to 4 weeks after the surgery to discuss pathology.  2.  Family history: -Maternal aunt had breast cancer.  Maternal half brother died of colon cancer.  Total time spent is 25 minutes with more than 50% of the time spent face-to-face discussing scan results, pathology results and coordination of care.    Orders placed this encounter:  No orders of the defined types were placed in this encounter.     Derek Jack, MD [facility]  [phone]

## 2018-11-06 ENCOUNTER — Other Ambulatory Visit: Payer: Self-pay

## 2018-11-06 ENCOUNTER — Encounter (HOSPITAL_BASED_OUTPATIENT_CLINIC_OR_DEPARTMENT_OTHER): Payer: Self-pay | Admitting: *Deleted

## 2018-11-09 ENCOUNTER — Encounter (HOSPITAL_COMMUNITY): Payer: Self-pay | Admitting: *Deleted

## 2018-11-09 ENCOUNTER — Encounter: Payer: Self-pay | Admitting: Hematology

## 2018-11-10 NOTE — Progress Notes (Signed)
Ensure pre- surgery drink and CHG soap given to daughter with instruction to finish drink by 0515 and bathe in soap night before and am of surgery. She voiced understanding without questions.

## 2018-11-11 NOTE — Anesthesia Preprocedure Evaluation (Addendum)
Anesthesia Evaluation  Patient identified by MRN, date of birth, ID band Patient awake    Reviewed: Allergy & Precautions  Airway Mallampati: II  TM Distance: >3 FB     Dental   Pulmonary neg pulmonary ROS, former smoker,    breath sounds clear to auscultation       Cardiovascular negative cardio ROS   Rhythm:Regular Rate:Normal     Neuro/Psych    GI/Hepatic negative GI ROS, Neg liver ROS,   Endo/Other  negative endocrine ROS  Renal/GU negative Renal ROS     Musculoskeletal  (+) Arthritis ,   Abdominal   Peds  Hematology   Anesthesia Other Findings   Reproductive/Obstetrics                            Anesthesia Physical Anesthesia Plan  ASA: II  Anesthesia Plan: General   Post-op Pain Management:  Regional for Post-op pain   Induction: Intravenous  PONV Risk Score and Plan: 3 and Midazolam, Dexamethasone and Ondansetron  Airway Management Planned: LMA  Additional Equipment:   Intra-op Plan:   Post-operative Plan: Extubation in OR  Informed Consent: I have reviewed the patients History and Physical, chart, labs and discussed the procedure including the risks, benefits and alternatives for the proposed anesthesia with the patient or authorized representative who has indicated his/her understanding and acceptance.     Dental advisory given  Plan Discussed with: Anesthesiologist and CRNA  Anesthesia Plan Comments:      Anesthesia Quick Evaluation

## 2018-11-12 ENCOUNTER — Other Ambulatory Visit: Payer: Self-pay

## 2018-11-12 ENCOUNTER — Ambulatory Visit (HOSPITAL_BASED_OUTPATIENT_CLINIC_OR_DEPARTMENT_OTHER): Payer: BC Managed Care – PPO | Admitting: Anesthesiology

## 2018-11-12 ENCOUNTER — Encounter (HOSPITAL_COMMUNITY): Payer: Self-pay | Admitting: Hematology

## 2018-11-12 ENCOUNTER — Ambulatory Visit (HOSPITAL_BASED_OUTPATIENT_CLINIC_OR_DEPARTMENT_OTHER)
Admission: RE | Admit: 2018-11-12 | Discharge: 2018-11-13 | Disposition: A | Discharge status: Home / Self Care | Payer: BC Managed Care – PPO | Attending: General Surgery | Admitting: General Surgery

## 2018-11-12 ENCOUNTER — Telehealth (HOSPITAL_COMMUNITY): Payer: Self-pay | Admitting: *Deleted

## 2018-11-12 ENCOUNTER — Encounter (HOSPITAL_BASED_OUTPATIENT_CLINIC_OR_DEPARTMENT_OTHER)
Admission: RE | Disposition: A | Discharge status: Home / Self Care | Payer: Self-pay | Source: Home / Self Care | Attending: General Surgery

## 2018-11-12 ENCOUNTER — Encounter (HOSPITAL_BASED_OUTPATIENT_CLINIC_OR_DEPARTMENT_OTHER): Payer: Self-pay | Admitting: *Deleted

## 2018-11-12 ENCOUNTER — Encounter (HOSPITAL_COMMUNITY)
Admission: RE | Admit: 2018-11-12 | Discharge: 2018-11-12 | Disposition: A | Discharge status: Home / Self Care | Payer: BC Managed Care – PPO | Source: Ambulatory Visit | Attending: General Surgery | Admitting: General Surgery

## 2018-11-12 DIAGNOSIS — Z803 Family history of malignant neoplasm of breast: Secondary | ICD-10-CM | POA: Insufficient documentation

## 2018-11-12 DIAGNOSIS — Z170 Estrogen receptor positive status [ER+]: Secondary | ICD-10-CM | POA: Diagnosis not present

## 2018-11-12 DIAGNOSIS — Z79899 Other long term (current) drug therapy: Secondary | ICD-10-CM | POA: Diagnosis not present

## 2018-11-12 DIAGNOSIS — Z9011 Acquired absence of right breast and nipple: Secondary | ICD-10-CM

## 2018-11-12 DIAGNOSIS — C50912 Malignant neoplasm of unspecified site of left female breast: Secondary | ICD-10-CM | POA: Diagnosis not present

## 2018-11-12 DIAGNOSIS — Z87891 Personal history of nicotine dependence: Secondary | ICD-10-CM | POA: Insufficient documentation

## 2018-11-12 DIAGNOSIS — Z9049 Acquired absence of other specified parts of digestive tract: Secondary | ICD-10-CM | POA: Diagnosis not present

## 2018-11-12 DIAGNOSIS — M1990 Unspecified osteoarthritis, unspecified site: Secondary | ICD-10-CM | POA: Diagnosis not present

## 2018-11-12 DIAGNOSIS — Z8379 Family history of other diseases of the digestive system: Secondary | ICD-10-CM | POA: Insufficient documentation

## 2018-11-12 DIAGNOSIS — Z8261 Family history of arthritis: Secondary | ICD-10-CM | POA: Diagnosis not present

## 2018-11-12 DIAGNOSIS — C50512 Malignant neoplasm of lower-outer quadrant of left female breast: Secondary | ICD-10-CM

## 2018-11-12 HISTORY — DX: Malignant (primary) neoplasm, unspecified: C80.1

## 2018-11-12 HISTORY — DX: Anxiety disorder, unspecified: F41.9

## 2018-11-12 HISTORY — PX: MASTECTOMY W/ SENTINEL NODE BIOPSY: SHX2001

## 2018-11-12 SURGERY — MASTECTOMY WITH SENTINEL LYMPH NODE BIOPSY
Anesthesia: General | Site: Breast | Laterality: Left

## 2018-11-12 MED ORDER — DEXAMETHASONE SODIUM PHOSPHATE 4 MG/ML IJ SOLN
INTRAMUSCULAR | Status: DC | PRN
  Administered 2018-11-12: 10 mg via INTRAVENOUS

## 2018-11-12 MED ORDER — SODIUM CHLORIDE 0.9 % IV SOLN
INTRAVENOUS | Status: DC
  Administered 2018-11-12: 12:00:00 via INTRAVENOUS

## 2018-11-12 MED ORDER — ONDANSETRON HCL 4 MG/2ML IJ SOLN
INTRAMUSCULAR | Status: AC
  Filled 2018-11-12: qty 2

## 2018-11-12 MED ORDER — CEFAZOLIN SODIUM-DEXTROSE 2-4 GM/100ML-% IV SOLN
INTRAVENOUS | Status: AC
  Filled 2018-11-12: qty 100

## 2018-11-12 MED ORDER — FENTANYL CITRATE (PF) 100 MCG/2ML IJ SOLN
INTRAMUSCULAR | Status: AC
  Filled 2018-11-12: qty 2

## 2018-11-12 MED ORDER — MORPHINE SULFATE (PF) 4 MG/ML IV SOLN: 1.0000 mg | INTRAVENOUS | Status: DC | PRN

## 2018-11-12 MED ORDER — ACETAMINOPHEN 500 MG PO TABS
1000.0000 mg | ORAL_TABLET | Freq: Four times a day (QID) | ORAL | Status: DC
  Administered 2018-11-12 – 2018-11-13 (×3): 1000 mg via ORAL
  Filled 2018-11-12 (×3): qty 2

## 2018-11-12 MED ORDER — SIMETHICONE 80 MG PO CHEW: 40.0000 mg | CHEWABLE_TABLET | Freq: Four times a day (QID) | ORAL | Status: DC | PRN

## 2018-11-12 MED ORDER — FENTANYL CITRATE (PF) 100 MCG/2ML IJ SOLN
25.0000 ug | INTRAMUSCULAR | Status: DC | PRN
  Administered 2018-11-12: 50 ug via INTRAVENOUS
  Administered 2018-11-12: 25 ug via INTRAVENOUS
  Administered 2018-11-12: 50 ug via INTRAVENOUS

## 2018-11-12 MED ORDER — HYDROCHLOROTHIAZIDE 25 MG PO TABS: 25.0000 mg | ORAL_TABLET | Freq: Every day | ORAL | Status: DC

## 2018-11-12 MED ORDER — LACTATED RINGERS IV SOLN
INTRAVENOUS | Status: DC
  Administered 2018-11-12: 08:00:00 via INTRAVENOUS

## 2018-11-12 MED ORDER — CHLORHEXIDINE GLUCONATE CLOTH 2 % EX PADS: 6.0000 | MEDICATED_PAD | Freq: Once | CUTANEOUS | Status: DC

## 2018-11-12 MED ORDER — DEXAMETHASONE SODIUM PHOSPHATE 10 MG/ML IJ SOLN
INTRAMUSCULAR | Status: AC
  Filled 2018-11-12: qty 1

## 2018-11-12 MED ORDER — GABAPENTIN 100 MG PO CAPS
100.0000 mg | ORAL_CAPSULE | ORAL | Status: AC
  Administered 2018-11-12: 100 mg via ORAL

## 2018-11-12 MED ORDER — PROPOFOL 500 MG/50ML IV EMUL
INTRAVENOUS | Status: AC
  Filled 2018-11-12: qty 50

## 2018-11-12 MED ORDER — SCOPOLAMINE 1 MG/3DAYS TD PT72: 1.0000 | MEDICATED_PATCH | Freq: Once | TRANSDERMAL | Status: DC | PRN

## 2018-11-12 MED ORDER — LIDOCAINE HCL (CARDIAC) PF 100 MG/5ML IV SOSY
PREFILLED_SYRINGE | INTRAVENOUS | Status: DC | PRN
  Administered 2018-11-12: 100 mg via INTRAVENOUS

## 2018-11-12 MED ORDER — CEFAZOLIN SODIUM-DEXTROSE 2-4 GM/100ML-% IV SOLN
2.0000 g | INTRAVENOUS | Status: AC
  Administered 2018-11-12: 2 g via INTRAVENOUS

## 2018-11-12 MED ORDER — LIDOCAINE 2% (20 MG/ML) 5 ML SYRINGE
INTRAMUSCULAR | Status: AC
  Filled 2018-11-12: qty 5

## 2018-11-12 MED ORDER — PROPOFOL 10 MG/ML IV BOLUS
INTRAVENOUS | Status: DC | PRN
  Administered 2018-11-12: 150 mg via INTRAVENOUS
  Administered 2018-11-12: 50 mg via INTRAVENOUS

## 2018-11-12 MED ORDER — ONDANSETRON 4 MG PO TBDP: 4.0000 mg | ORAL_TABLET | Freq: Four times a day (QID) | ORAL | Status: DC | PRN

## 2018-11-12 MED ORDER — TECHNETIUM TC 99M SULFUR COLLOID FILTERED
1.0000 | Freq: Once | INTRAVENOUS | Status: AC | PRN
  Administered 2018-11-12: 1 via INTRADERMAL

## 2018-11-12 MED ORDER — ENSURE PRE-SURGERY PO LIQD: 296.0000 mL | Freq: Once | ORAL | Status: DC

## 2018-11-12 MED ORDER — ONDANSETRON HCL 4 MG/2ML IJ SOLN: 4.0000 mg | Freq: Four times a day (QID) | INTRAMUSCULAR | Status: DC | PRN

## 2018-11-12 MED ORDER — GABAPENTIN 100 MG PO CAPS
ORAL_CAPSULE | ORAL | Status: AC
  Filled 2018-11-12: qty 1

## 2018-11-12 MED ORDER — MIDAZOLAM HCL 2 MG/2ML IJ SOLN
INTRAMUSCULAR | Status: AC
  Filled 2018-11-12: qty 2

## 2018-11-12 MED ORDER — ACETAMINOPHEN 500 MG PO TABS
ORAL_TABLET | ORAL | Status: AC
  Filled 2018-11-12: qty 2

## 2018-11-12 MED ORDER — ONDANSETRON HCL 4 MG/2ML IJ SOLN
INTRAMUSCULAR | Status: DC | PRN
  Administered 2018-11-12: 4 mg via INTRAVENOUS

## 2018-11-12 MED ORDER — METHOCARBAMOL 500 MG PO TABS
500.0000 mg | ORAL_TABLET | Freq: Three times a day (TID) | ORAL | Status: DC
  Administered 2018-11-12 (×2): 500 mg via ORAL
  Filled 2018-11-12 (×3): qty 1

## 2018-11-12 MED ORDER — MIDAZOLAM HCL 2 MG/2ML IJ SOLN
1.0000 mg | INTRAMUSCULAR | Status: DC | PRN
  Administered 2018-11-12: 1.5 mg via INTRAVENOUS

## 2018-11-12 MED ORDER — FENTANYL CITRATE (PF) 100 MCG/2ML IJ SOLN
50.0000 ug | INTRAMUSCULAR | Status: DC | PRN
  Administered 2018-11-12 (×2): 50 ug via INTRAVENOUS

## 2018-11-12 MED ORDER — ACETAMINOPHEN 500 MG PO TABS
1000.0000 mg | ORAL_TABLET | ORAL | Status: AC
  Administered 2018-11-12: 1000 mg via ORAL

## 2018-11-12 MED ORDER — OXYCODONE HCL 5 MG PO TABS
5.0000 mg | ORAL_TABLET | ORAL | Status: DC | PRN
  Administered 2018-11-12 – 2018-11-13 (×4): 5 mg via ORAL
  Filled 2018-11-12 (×4): qty 1

## 2018-11-12 SURGICAL SUPPLY — 52 items
APPLIER CLIP 11 MED OPEN (CLIP) ×6
BINDER BREAST LRG (GAUZE/BANDAGES/DRESSINGS) IMPLANT
BINDER BREAST XLRG (GAUZE/BANDAGES/DRESSINGS) IMPLANT
BINDER BREAST XXLRG (GAUZE/BANDAGES/DRESSINGS) ×3 IMPLANT
BIOPATCH RED 1 DISK 7.0 (GAUZE/BANDAGES/DRESSINGS) ×4 IMPLANT
BIOPATCH RED 1IN DISK 7.0MM (GAUZE/BANDAGES/DRESSINGS) ×2
BLADE HEX COATED 2.75 (ELECTRODE) ×3 IMPLANT
BLADE SURG 10 STRL SS (BLADE) ×3 IMPLANT
BLADE SURG 15 STRL LF DISP TIS (BLADE) ×1 IMPLANT
BLADE SURG 15 STRL SS (BLADE) ×2
CANISTER SUCT 1200ML W/VALVE (MISCELLANEOUS) ×3 IMPLANT
CHLORAPREP W/TINT 26 (MISCELLANEOUS) ×3 IMPLANT
CLIP APPLIE 11 MED OPEN (CLIP) ×2 IMPLANT
CLOSURE WOUND 1/2 X4 (GAUZE/BANDAGES/DRESSINGS) ×3
COVER BACK TABLE REUSABLE LG (DRAPES) ×3 IMPLANT
COVER MAYO STAND REUSABLE (DRAPES) ×3 IMPLANT
DERMABOND ADVANCED (GAUZE/BANDAGES/DRESSINGS) ×4
DERMABOND ADVANCED .7 DNX12 (GAUZE/BANDAGES/DRESSINGS) ×2 IMPLANT
DRAIN CHANNEL 19F RND (DRAIN) ×6 IMPLANT
DRAPE TOP ARMCOVERS (MISCELLANEOUS) ×3 IMPLANT
DRAPE U-SHAPE 76X120 STRL (DRAPES) ×3 IMPLANT
DRAPE UTILITY XL STRL (DRAPES) ×3 IMPLANT
DRSG PAD ABDOMINAL 8X10 ST (GAUZE/BANDAGES/DRESSINGS) ×6 IMPLANT
DRSG TEGADERM 4X4.75 (GAUZE/BANDAGES/DRESSINGS) ×6 IMPLANT
ELECT REM PT RETURN 9FT ADLT (ELECTROSURGICAL) ×3
ELECTRODE REM PT RTRN 9FT ADLT (ELECTROSURGICAL) ×1 IMPLANT
EVACUATOR SILICONE 100CC (DRAIN) ×6 IMPLANT
GAUZE SPONGE 4X4 12PLY STRL LF (GAUZE/BANDAGES/DRESSINGS) IMPLANT
GLOVE BIO SURGEON STRL SZ7 (GLOVE) ×9 IMPLANT
GLOVE BIOGEL PI IND STRL 7.5 (GLOVE) ×3 IMPLANT
GLOVE BIOGEL PI INDICATOR 7.5 (GLOVE) ×6
GOWN STRL REUS W/ TWL LRG LVL3 (GOWN DISPOSABLE) ×3 IMPLANT
GOWN STRL REUS W/TWL LRG LVL3 (GOWN DISPOSABLE) ×6
NS IRRIG 1000ML POUR BTL (IV SOLUTION) ×3 IMPLANT
PACK BASIN DAY SURGERY FS (CUSTOM PROCEDURE TRAY) ×3 IMPLANT
PENCIL BUTTON HOLSTER BLD 10FT (ELECTRODE) ×3 IMPLANT
PIN SAFETY STERILE (MISCELLANEOUS) ×3 IMPLANT
SLEEVE SCD COMPRESS KNEE MED (MISCELLANEOUS) ×3 IMPLANT
SPONGE LAP 18X18 RF (DISPOSABLE) ×12 IMPLANT
STRIP CLOSURE SKIN 1/2X4 (GAUZE/BANDAGES/DRESSINGS) ×6 IMPLANT
SUT ETHILON 2 0 FS 18 (SUTURE) ×6 IMPLANT
SUT MNCRL AB 4-0 PS2 18 (SUTURE) ×6 IMPLANT
SUT SILK 2 0 SH (SUTURE) ×3 IMPLANT
SUT VIC AB 2-0 SH 18 (SUTURE) ×3 IMPLANT
SUT VIC AB 3-0 54X BRD REEL (SUTURE) IMPLANT
SUT VIC AB 3-0 BRD 54 (SUTURE)
SUT VICRYL 3-0 CR8 SH (SUTURE) ×6 IMPLANT
TOWEL GREEN STERILE FF (TOWEL DISPOSABLE) ×6 IMPLANT
TRAY DSU PREP LF (CUSTOM PROCEDURE TRAY) ×3 IMPLANT
TUBE CONNECTING 20'X1/4 (TUBING) ×1
TUBE CONNECTING 20X1/4 (TUBING) ×2 IMPLANT
YANKAUER SUCT BULB TIP NO VENT (SUCTIONS) ×6 IMPLANT

## 2018-11-12 NOTE — Anesthesia Postprocedure Evaluation (Signed)
Anesthesia Post Note  Patient: Anna Carney  Procedure(s) Performed: LEFT MASTECTOMY WITH LEFT AXILLARY SENTINEL LYMPH NODE BIOPSY (Left Breast)     Anesthesia Post Evaluation  Last Vitals:  Vitals:   11/12/18 1345 11/12/18 1517  BP:  110/70  Pulse:  81  Resp:  16  Temp:  36.6 C  SpO2: 99% 95%    Last Pain:  Vitals:   11/12/18 1600  TempSrc:   PainSc: 0-No pain                 Dreydon Cardenas

## 2018-11-12 NOTE — Progress Notes (Signed)
Patient's daughter called wanting access to mychart. Code generated and I helped her get access.

## 2018-11-12 NOTE — Progress Notes (Signed)
Assisted Dr. Carlota Raspberry with left, ultrasound guided, pectoralis block. Side rails up, monitors on throughout procedure. See vital signs in flow sheet. Tolerated Procedure well.

## 2018-11-12 NOTE — Op Note (Signed)
Preoperative diagnosis: T4 left breast cancer Postoperative diagnosis: same as above Procedure: Left modified radical mastectomy Surgeon: Dr Serita Grammes Asst: Dr Verita Lamb Anesthesia: general with pectoral block EBL; 50 cc Drains 2 19 Fr Blake drains Specimens: 1. Left mastectomy short superior, long lateral 2. Left axillary lymph node dissection Complications none Sponge and needle count correct dispo to recovery stable  Indications: This is a 33 yof with neglected er/pr pos breast cancer. There is no evidence of stage IV disease.  We discussed proceeding with left mastectomy and attempt at sn biopsy   Procedure: After informed consent was obtained the patient first underwent a pectoral block.  She was injected with technetium in the standard fashion.  She was given antibiotics.  SCDs were in place.  She was then placed under general anesthesia without complication.  Her left chest was prepped and draped in the standard sterile surgical fashion.  A surgical timeout was then performed.  She had a large open ulcerated area inferiorly and a very large tumor present.  I elected to make a reduction pattern incision and remove her nipple areola and her skin as well as all of the skin that was overlying the tumor.  I made an inframammary incision and then a vertical extension.  I then created flaps to the clavicle, parasternal region, inframammary fold and latissimus laterally.  I then remove the breast and the pectoralis fascia from the muscle.  This was removed in total.  This was passed off the table as a specimen.  I located several sentinel lymph nodes in the tail of the breast.  These were enlarged and I did feel some other enlarged lymph nodes in her axilla.  Due to the fact that she had a very large tumor and the validity of a sentinel lymph node biopsy was in question I elected to proceed with an axillary lymph node dissection.  I noted the vein.  I swept the tissue caudad from the vein  taking care to avoid the thoracodorsal bundle and thoracic long thoracic nerve.  This was then removed and passed off the table as well.  Irrigation was performed.  Hemostasis was observed.  I then placed a 15 [place] Blake drain in the axilla.  I placed an additional 74 [place] Blake drain in the mastectomy space.  I did closed the lateral tissue with 2-0 Vicryl.  Then the dermis was all closed with 3-0 Vicryl and the skin with 4-0 Monocryl.  Steri-Strips and glue were applied.  A Biopatch and Tegaderm were placed over the drains which was secured with 2-0 nylon.  She tolerated this well was extubated and transferred to recovery stable.

## 2018-11-12 NOTE — Anesthesia Procedure Notes (Signed)
Anesthesia Regional Block: Pectoralis block   Pre-Anesthetic Checklist: ,, timeout performed, Correct Patient, Correct Site, Correct Laterality, Correct Procedure, Correct Position, site marked, Risks and benefits discussed,  Surgical consent,  Pre-op evaluation,  At surgeon's request and post-op pain management  Laterality: Left  Prep: chloraprep       Needles:  Injection technique: Single-shot  Needle Type: Echogenic Stimulator Needle          Additional Needles:   Procedures: Doppler guided,,,, ultrasound used (permanent image in chart),,,,  Narrative:  Start time: 11/12/2018 8:00 AM End time: 11/12/2018 8:15 AM Injection made incrementally with aspirations every 5 mL.  Performed by: Personally  Anesthesiologist: Belinda Block, MD

## 2018-11-12 NOTE — Interval H&P Note (Signed)
History and Physical Interval Note:  11/12/2018 8:53 AM  Anna Carney  has presented today for surgery, with the diagnosis of BREAST CANCER.  The various methods of treatment have been discussed with the patient and family. After consideration of risks, benefits and other options for treatment, the patient has consented to  Procedure(s): LEFT MASTECTOMY WITH LEFT AXILLARY SENTINEL LYMPH NODE BIOPSY (Left) as a surgical intervention.  The patient's history has been reviewed, patient examined, no change in status, stable for surgery.  I have reviewed the patient's chart and labs.  Questions were answered to the patient's satisfaction.     Rolm Bookbinder

## 2018-11-12 NOTE — H&P (Signed)
68 yof referred by Dr Delton Coombes for fungating left breast cancer. she is otherwise healthy. no prior breast history. fh in one aunt who was elderly. she is here with her two daughters. she has a left breast mass for over a year. this has progressed to pain and foul smelling with open wound. she underwent mm on right that shows c density breasts. there was assymetry on the right . US shows a irregular hypoechoic mass present. US of the left breast shows a 4.7x2.6x5.3 cm with small satellitle nodule. there is indeterminate node in left axilla with no other abnormal nodes. she had biopsy of the the right sided mass and this is fa concordant. the left axillarynode is negative. the left breast mass is grade 2-3 IDC that 100% er/pr pos, her 2 negative and Ki is 10%. she is dressing the wound as it drains and smells foul.    Past Surgical History Illene Regulus, CMA; 11/02/2018 3:15 PM) Breast Biopsy  Bilateral. Gallbladder Surgery - Laparoscopic   Diagnostic Studies History Lars Mage Spillers, CMA; 11/02/2018 3:15 PM) Colonoscopy  never Mammogram  within last year Pap Smear  >5 years ago  Allergies Illene Regulus, CMA; 11/02/2018 3:18 PM) No Known Drug Allergies [11/02/2018]: Allergies Reconciled   Medication History (Alisha Spillers, CMA; 11/02/2018 3:18 PM) ALPRAZolam (0.25MG  Tablet, Oral) Active. hydroCHLOROthiazide (25MG  Tablet, Oral) Active. Silver sulfADIAZINE (1% Cream, External) Active. Medications Reconciled  Social History Illene Regulus, CMA; 11/02/2018 3:15 PM) Alcohol use  Occasional alcohol use. Caffeine use  Carbonated beverages. No drug use  Tobacco use  Former smoker.  Family History Illene Regulus, [place]; 11/02/2018 3:15 PM) Arthritis  Mother. Colon Cancer  Brother. Diabetes Mellitus  Mother.  Pregnancy / Birth History Illene Regulus, CMA; 11/02/2018 3:15 PM) Age at menarche  54 years. Age of menopause  13-55 Gravida  1 Maternal age   5-20 Para  1  Other Problems Illene Regulus, CMA; 11/02/2018 3:15 PM) Arthritis  Breast Cancer  Cholelithiasis  Other disease, cancer, significant illness    Review of Systems Lars Mage Spillers CMA; 11/02/2018 3:15 PM) General Not Present- Appetite Loss, Chills, Fatigue, Fever, Night Sweats, Weight Gain and Weight Loss. Skin Present- New Lesions. Not Present- Change in Wart/Mole, Dryness, Hives, Jaundice, Non-Healing Wounds, Rash and Ulcer. HEENT Present- Ringing in the Ears and Wears glasses/contact lenses. Not Present- Earache, Hearing Loss, Hoarseness, Nose Bleed, Oral Ulcers, Seasonal Allergies, Sinus Pain, Sore Throat, Visual Disturbances and Yellow Eyes. Respiratory Not Present- Bloody sputum, Chronic Cough, Difficulty Breathing, Snoring and Wheezing. Cardiovascular Not Present- Chest Pain, Difficulty Breathing Lying Down, Leg Cramps, Palpitations, Rapid Heart Rate, Shortness of Breath and Swelling of Extremities. Gastrointestinal Not Present- Abdominal Pain, Bloating, Bloody Stool, Change in Bowel Habits, Chronic diarrhea, Constipation, Difficulty Swallowing, Excessive gas, Gets full quickly at meals, Hemorrhoids, Indigestion, Nausea, Rectal Pain and Vomiting. Female Genitourinary Not Present- Frequency, Nocturia, Painful Urination, Pelvic Pain and Urgency. Musculoskeletal Present- Joint Pain. Not Present- Back Pain, Joint Stiffness, Muscle Pain, Muscle Weakness and Swelling of Extremities. Neurological Not Present- Decreased Memory, Fainting, Headaches, Numbness, Seizures, Tingling, Tremor, Trouble walking and Weakness. Psychiatric Present- Frequent crying. Not Present- Anxiety, Bipolar, Change in Sleep Pattern, Depression and Fearful. Endocrine Not Present- Cold Intolerance, Excessive Hunger, Hair Changes, Heat Intolerance, Hot flashes and New Diabetes. Hematology Not Present- Blood Thinners, Easy Bruising, Excessive bleeding, Gland problems, HIV and Persistent  Infections.  Vitals (Alisha Spillers CMA; 11/02/2018 3:18 PM) 11/02/2018 3:17 PM Weight: 207.6 lb Height: 64in Body Surface Area: 1.99 m Body Mass Index: 35.63  kg/m  Temp.: 98.101F(Oral)  Pulse: 112 (Regular)  BP: 160/96 (Sitting, Left Arm, Standard)     Physical Exam Rolm Bookbinder MD; 11/02/2018 3:37 PM) General Mental Status-Alert. Head and Neck Trachea-midline. Thyroid Gland Characteristics - normal size and consistency. Eye Sclera/Conjunctiva - Bilateral-No scleral icterus. Chest and Lung Exam Chest and lung exam reveals -quiet, even and easy respiratory effort with no use of accessory muscles. Breast Nipples-No Discharge. Note: large 5 cm fungating left lower outer quadrant breast mass, foul smelling, mobile Cardiovascular Cardiovascular examination reveals -normal heart sounds, regular rate and rhythm with no murmurs. Abdomen Note: soft Neurologic Neurologic evaluation reveals -alert and oriented x 3 with no impairment of recent or remote memory. Lymphatic Head & Neck General Head & Neck Lymphatics: Bilateral - Description - Normal. Axillary General Axillary Region: Bilateral - Description - Normal. Note: no [place] adenopathy   Assessment & Plan Rolm Bookbinder MD; 11/02/2018 4:12 PM) BREAST CANCER OF LOWER-OUTER QUADRANT OF LEFT FEMALE BREAST (C50.512) Story: Left mastectomy, poss sn biopsy vs alnd she has a t4 tumor. I do think resectable. Its not aggressive tumor so I think proceeding with mastectomy as discussed today is only option. I will also consider sn biopsy although t4 tumor or low lying alnd if cannot localize or get appropriate number of nodes. will await pet and if positive for stage 4 disease will just do mastectomy at that point. we discused surgery, risks and recovery today.

## 2018-11-12 NOTE — Anesthesia Procedure Notes (Signed)
Procedure Name: LMA Insertion Date/Time: 11/12/2018 9:14 AM Performed by: Lyndee Leo, CRNA Pre-anesthesia Checklist: Patient identified, Emergency Drugs available, Suction available and Patient being monitored Patient Re-evaluated:Patient Re-evaluated prior to induction Oxygen Delivery Method: Circle system utilized Preoxygenation: Pre-oxygenation with 100% oxygen Induction Type: IV induction Ventilation: Mask ventilation without difficulty LMA: LMA inserted LMA Size: 4.0 Number of attempts: 1 Airway Equipment and Method: Bite block Placement Confirmation: positive ETCO2 Tube secured with: Tape Dental Injury: Teeth and Oropharynx as per pre-operative assessment

## 2018-11-12 NOTE — Transfer of Care (Signed)
Immediate Anesthesia Transfer of Care Note  Patient: Anna Carney  Procedure(s) Performed: LEFT MASTECTOMY WITH LEFT AXILLARY SENTINEL LYMPH NODE BIOPSY (Left Breast)  Patient Location: PACU  Anesthesia Type:General and Regional  Level of Consciousness: awake and sedated  Airway & Oxygen Therapy: Patient Spontanous Breathing and Patient connected to face mask oxygen  Post-op Assessment: Report given to RN and Post -op Vital signs reviewed and stable  Post vital signs: Reviewed and stable  Last Vitals:  Vitals Value Taken Time  BP    Temp    Pulse 69 11/12/2018 10:57 AM  Resp 19 11/12/2018 10:57 AM  SpO2 100 % 11/12/2018 10:57 AM  Vitals shown include unvalidated device data.  Last Pain:  Vitals:   11/12/18 0750  TempSrc: Oral  PainSc: 0-No pain         Complications: No apparent anesthesia complications

## 2018-11-12 NOTE — Telephone Encounter (Signed)
Patient's daughter called and wanted access to mychart.

## 2018-11-12 NOTE — Anesthesia Postprocedure Evaluation (Signed)
Anesthesia Post Note  Patient: Anna Carney  Procedure(s) Performed: LEFT MASTECTOMY WITH LEFT AXILLARY SENTINEL LYMPH NODE BIOPSY (Left Breast)     Patient location during evaluation: PACU Anesthesia Type: General Level of consciousness: awake Pain management: pain level controlled Vital Signs Assessment: post-procedure vital signs reviewed and stable Respiratory status: spontaneous breathing Cardiovascular status: stable Postop Assessment: no apparent nausea or vomiting Anesthetic complications: no    Last Vitals:  Vitals:   11/12/18 1345 11/12/18 1517  BP:  110/70  Pulse:  81  Resp:  16  Temp:  36.6 C  SpO2: 99% 95%    Last Pain:  Vitals:   11/12/18 1600  TempSrc:   PainSc: 0-No pain                 Bodhi Moradi

## 2018-11-13 ENCOUNTER — Encounter (HOSPITAL_BASED_OUTPATIENT_CLINIC_OR_DEPARTMENT_OTHER): Payer: Self-pay | Admitting: General Surgery

## 2018-11-13 DIAGNOSIS — C50912 Malignant neoplasm of unspecified site of left female breast: Secondary | ICD-10-CM | POA: Diagnosis not present

## 2018-11-13 MED ORDER — OXYCODONE HCL 5 MG PO TABS: 5.0000 mg | ORAL_TABLET | ORAL | 0 refills | Status: DC | PRN

## 2018-11-13 MED ORDER — METHOCARBAMOL 500 MG PO TABS: 500.0000 mg | ORAL_TABLET | Freq: Three times a day (TID) | ORAL | 0 refills | Status: DC

## 2018-11-13 NOTE — Discharge Summary (Signed)
Physician Discharge Summary  Patient ID: Anna Carney MRN: [id number] DOB/AGE: 1951-01-04 68 y.o.  Admit date: 11/12/2018 Discharge date: 11/13/2018  Admission Diagnoses: Left breast cancer Discharge Diagnoses:  Active Problems:   S/P mastectomy, right   Discharged Condition: good  Hospital Course: 76 yof underwent left mrm for T4 breast cancer eroding through skin. She has done well and will be discharged today  Consults: None  Significant Diagnostic Studies: none  Treatments: surgery: left mrm  Discharge Exam: Blood pressure (!) 96/58, pulse (!) 53, temperature 97.9 F (36.6 C), resp. rate 20, height 5\' 4"  (1.626 m), weight 93.5 kg, SpO2 97 %. Incision/Wound:flaps viable, drains as expected  Disposition: Discharge disposition: 01-Home or Self Care        Allergies as of 11/13/2018   No Known Allergies     Medication List    TAKE these medications   acetaminophen 325 MG tablet Commonly known as:  TYLENOL Take 650 mg by mouth every 6 (six) hours as needed.   ALPRAZolam 0.25 MG tablet Commonly known as:  XANAX Take 1 tablet (0.25 mg total) by mouth 2 (two) times daily as needed for anxiety.   ASPERCREME EX Apply topically as needed.   cholecalciferol 25 MCG (1000 UT) tablet Commonly known as:  VITAMIN D3 Take 2,000 Units by mouth daily.   hydrochlorothiazide 25 MG tablet Commonly known as:  HYDRODIURIL Take 1 tablet (25 mg total) by mouth daily.   methocarbamol 500 MG tablet Commonly known as:  ROBAXIN Take 1 tablet (500 mg total) by mouth 3 (three) times daily.   multivitamin tablet Take 1 tablet by mouth daily.   oxyCODONE 5 MG immediate release tablet Commonly known as:  Oxy IR/ROXICODONE Take 1 tablet (5 mg total) by mouth every 4 (four) hours as needed for moderate pain.   silver sulfADIAZINE 1 % cream Commonly known as:  Silvadene Apply 1 application topically daily. Thin layer to area twice daily   vitamin C 1000 MG tablet Take  2,000 mg by mouth daily.      Follow-up Information    Rolm Bookbinder, MD In 1 week.   Specialty:  General Surgery Contact information: [place] [id number] [phone]           Signed: Rolm Bookbinder 11/13/2018, 8:45 AM

## 2018-11-13 NOTE — Discharge Instructions (Signed)
[facility] °[phone] ° °MASTECTOMY: POST OP INSTRUCTIONS °Take 400 mg of ibuprofen every 8 hours or 650 mg tylenol every 6 hours for next 72 hours then as needed. Use ice several times daily also. °Always review your discharge instruction sheet given to you by the facility where your surgery was performed. ° °IF YOU HAVE DISABILITY OR FAMILY LEAVE FORMS, YOU MUST BRING THEM TO THE OFFICE FOR PROCESSING.   °DO NOT GIVE THEM TO YOUR DOCTOR. °A prescription for pain medication may be given to you upon discharge.  Take your pain medication as prescribed, if needed.  If narcotic pain medicine is not needed, then you may take acetaminophen (Tylenol), naprosyn (Alleve) or ibuprofen (Advil) as needed. °1. Take your usually prescribed medications unless otherwise directed. °2. If you need a refill on your pain medication, please contact your pharmacy.  They will contact our office to request authorization.  Prescriptions will not be filled after 5pm or on week-ends. °3. You should follow a light diet the first few days after arrival home, such as soup and crackers, etc.  Resume your normal diet the day after surgery. °4. Most patients will experience some swelling and bruising on the chest and underarm.  Ice packs will help.  Swelling and bruising can take several days to resolve. Wear the binder day and night until you return to the office.  °5. It is common to experience some constipation if taking pain medication after surgery.  Increasing fluid intake and taking a stool softener (such as Colace) will usually help or prevent this problem from occurring.  A mild laxative (Milk of Magnesia or Miralax) should be taken according to package instructions if there are no bowel movements after 48 hours. °6. Unless discharge instructions indicate otherwise, leave your bandage dry and in place until your next appointment in 3-5 days.  You may take a limited sponge bath.  No tube baths or showers until the  drains are removed.  You may have steri-strips (small skin tapes) in place directly over the incision.  These strips should be left on the skin for 7-10 days. If you have glue it will come off in next couple week.  Any sutures will be removed at an office visit °7. DRAINS:  If you have drains in place, it is important to keep a list of the amount of drainage produced each day in your drains.  Before leaving the hospital, you should be instructed on drain care.  Call our office if you have any questions about your drains. I will remove your drains when they put out less than 30 cc or ml for 2 consecutive days. °8. ACTIVITIES:  You may resume regular (light) daily activities beginning the next day--such as daily self-care, walking, climbing stairs--gradually increasing activities as tolerated.  You may have sexual intercourse when it is comfortable.  Refrain from any heavy lifting or straining until approved by your doctor. °a. You may drive when you are no longer taking prescription pain medication, you can comfortably wear a seatbelt, and you can safely maneuver your car and apply brakes. °b. RETURN TO WORK:  __________________________________________________________ °9. You should see your doctor in the office for a follow-up appointment approximately 3-5 days after your surgery.  Your doctor’s nurse will typically make your follow-up appointment when she calls you with your pathology report.  Expect your pathology report 3-4business days after surgery. °10. OTHER INSTRUCTIONS: ______________________________________________________________________________________________ ____________________________________________________________________________________________ °WHEN TO CALL YOUR DR [place]: °1. Fever over 101.0 °  Nausea and/or vomiting 3. Extreme swelling or bruising 4. Continued bleeding from incision. 5. Increased pain, redness, or drainage from the incision. The clinic staff is available to answer your  questions during regular business hours.  Please dont hesitate to call and ask to speak to one of the nurses for clinical concerns.  If you have a medical emergency, go to the nearest emergency room or call 911.  A surgeon from [facility] is always on call at the hospital. [address] [place], [place], [place]  [address] ? [address], [place], [place]   [address] [phone] ? [phone] ? FAX [phone] Web site: [email or web address]   Post Anesthesia Home Care Instructions  Activity: Get plenty of rest for the remainder of the day. A responsible individual must stay with you for 24 hours following the procedure.  For the next 24 hours, DO NOT: -Drive a car -Paediatric nurse -Drink alcoholic beverages -Take any medication unless instructed by your physician -Make any legal decisions or sign important papers.  Meals: Start with liquid foods such as gelatin or soup. Progress to regular foods as tolerated. Avoid greasy, spicy, heavy foods. If nausea and/or vomiting occur, drink only clear liquids until the nausea and/or vomiting subsides. Call your physician if vomiting continues.  Special Instructions/Symptoms: Your throat may feel dry or sore from the anesthesia or the breathing tube placed in your throat during surgery. If this causes discomfort, gargle with warm salt water. The discomfort should disappear within 24 hours.  If you had a scopolamine patch placed behind your ear for the management of post- operative nausea and/or vomiting:  1. The medication in the patch is effective for 72 hours, after which it should be removed.  Wrap patch in a tissue and discard in the trash. Wash hands thoroughly with soap and water. 2. You may remove the patch earlier than 72 hours if you experience unpleasant side effects which may include dry mouth, dizziness or visual disturbances. 3. Avoid touching the patch. Wash your hands with soap and water after  contact with the patch.    About my Jackson-Pratt Bulb Drain  What is a Jackson-Pratt bulb? A Jackson-Pratt is a soft, round device used to collect drainage. It is connected to a long, thin drainage catheter, which is held in place by one or two small stiches near your surgical incision site. When the bulb is squeezed, it forms a vacuum, forcing the drainage to empty into the bulb.  Emptying the Jackson-Pratt bulb- To empty the bulb: 1. Release the plug on the top of the bulb. 2. Pour the bulb's contents into a measuring container which your nurse will provide. 3. Record the time emptied and amount of drainage. Empty the drain(s) as often as your     doctor or nurse recommends.  Date                  Time                    Amount (Drain 1)                 Amount (Drain 2)  _____________________________________________________________________  _____________________________________________________________________  _____________________________________________________________________  _____________________________________________________________________  _____________________________________________________________________  _____________________________________________________________________  _____________________________________________________________________  _____________________________________________________________________  Squeezing the Jackson-Pratt Bulb- To squeeze the bulb: 1. Make sure the plug at the top of the bulb is open. 2. Squeeze the bulb tightly in your fist. You will hear air squeezing from the bulb. 3. Replace  the plug while the bulb is squeezed. 4. Use a safety pin to attach the bulb to your clothing. This will keep the catheter from     pulling at the bulb insertion site.  When to call your doctor- Call your doctor if:  Drain site becomes red, swollen or hot.  You have a fever greater than 101 degrees F.  There is oozing at the drain site.  Drain  falls out (apply a guaze bandage over the drain hole and secure it with tape).  Drainage increases daily not related to activity patterns. (You will usually have more drainage when you are active than when you are resting.)  Drainage has a bad odor.

## 2018-11-19 ENCOUNTER — Encounter (HOSPITAL_COMMUNITY): Payer: BC Managed Care – PPO

## 2018-11-26 ENCOUNTER — Ambulatory Visit (INDEPENDENT_AMBULATORY_CARE_PROVIDER_SITE_OTHER): Payer: BC Managed Care – PPO | Admitting: Family Medicine

## 2018-11-26 ENCOUNTER — Other Ambulatory Visit: Payer: Self-pay

## 2018-11-26 ENCOUNTER — Encounter: Payer: Self-pay | Admitting: Family Medicine

## 2018-11-26 DIAGNOSIS — R509 Fever, unspecified: Secondary | ICD-10-CM | POA: Diagnosis not present

## 2018-11-26 NOTE — Progress Notes (Signed)
Virtual Visit via telephone Note Due to COVID-19, visit is conducted virtually and was requested by patient.  I connected with Anna Carney on 11/26/18 at 1515 by telephone and verified that I am speaking with the correct person using two identifiers. Anna Carney is currently located at home and family is currently with them during visit. The provider, Monia Pouch, FNP is located in their office at time of visit.  I discussed the limitations, risks, security and privacy concerns of performing an evaluation and management service by telephone and the availability of in person appointments. I also discussed with the patient that there may be a patient responsible charge related to this service. The patient expressed understanding and agreed to proceed.  Subjective:  Patient ID: Anna Carney, female    DOB: 03-17-1951, 68 y.o.   MRN: [id number]  Chief Complaint:  Fever   HPI: Anna Carney is a 68 y.o. female presenting on 11/26/2018 for Fever   Pt reports she had a mastectomy 2 weeks ago today. States Sunday night she developed a fever of 102. She was seen by her surgeon on Monday and placed on doxycycline for a possible surgical site infection. Pt states the fever went away on Tuesday and then returned yesterday, states only 82 now. States she has been taking the antibiotic as prescribed. States she does not have swelling or drainage from the surgical incision. No swelling or erythema per pt. Pt denies other associated symptoms. Is taking antibiotics as prescribed and tylenol / advil for fever control.    Relevant past medical, surgical, family, and social history reviewed and updated as indicated.  Allergies and medications reviewed and updated.   Past Medical History:  Diagnosis Date  . Anxiety   . Arthritis   . Cancer ([place]) 10/2018   left breast IDC  . Shingles     Past Surgical History:  Procedure Laterality Date  . CHOLECYSTECTOMY    . MASTECTOMY W/ SENTINEL  NODE BIOPSY Left 11/12/2018   Procedure: LEFT MASTECTOMY WITH LEFT AXILLARY SENTINEL LYMPH NODE BIOPSY;  Surgeon: Rolm Bookbinder, MD;  Location: [facility];  Service: General;  Laterality: Left;    Social History   Socioeconomic History  . Marital status: Single    Spouse name: Not on file  . Number of children: Not on file  . Years of education: Not on file  . Highest education level: Not on file  Occupational History  . Occupation: [employer]    Comment: in garden center  . 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 Financial resource strain: Not hard at all  . Food insecurity:    Worry: Never true    Inability: Never true  . Transportation needs:    Medical: No    Non-medical: No  Tobacco Use  . Smoking status: Former Research scientist (life sciences)  . Smokeless tobacco: Never Used  Substance and Sexual Activity  . Alcohol use: Yes    Comment: rarely  . Drug use: No  . Sexual activity: Not Currently    Birth control/protection: Post-menopausal  Lifestyle  . Physical activity:    Days per week: Not on file    Minutes per session: Not on file  . Stress: Rather much  Relationships  . Social connections:    Talks on phone: Not on file    Gets together: Not on file    Attends religious service: Not on file    Active member  of club or organization: Not on file    Attends meetings of clubs or organizations: Not on file    Relationship status: Not on file  . Intimate partner violence:    Fear of current or ex partner: Not on file    Emotionally abused: Not on file    Physically abused: Not on file    Forced sexual activity: Not on file  Other Topics Concern  . Not on file  Social History Narrative  . Not on file    Outpatient Encounter Medications as of 11/26/2018  Medication Sig  . acetaminophen (TYLENOL) 325 MG tablet Take 650 mg by mouth every 6 (six) hours as needed.  . ALPRAZolam (XANAX) 0.25 MG tablet  Take 1 tablet (0.25 mg total) by mouth 2 (two) times daily as needed for anxiety.  . Ascorbic Acid (VITAMIN C) 1000 MG tablet Take 2,000 mg by mouth daily.  . cholecalciferol (VITAMIN D3) 25 MCG (1000 UT) tablet Take 2,000 Units by mouth daily.  . hydrochlorothiazide (HYDRODIURIL) 25 MG tablet Take 1 tablet (25 mg total) by mouth daily.  . methocarbamol (ROBAXIN) 500 MG tablet Take 1 tablet (500 mg total) by mouth 3 (three) times daily.  . Multiple Vitamin (MULTIVITAMIN) tablet Take 1 tablet by mouth daily.  [name] oxyCODONE (OXY IR/ROXICODONE) 5 MG immediate release tablet Take 1 tablet (5 mg total) by mouth every 4 (four) hours as needed for moderate pain.  . silver sulfADIAZINE (SILVADENE) 1 % cream Apply 1 application topically daily. Thin layer to area twice daily  . Trolamine Salicylate (ASPERCREME EX) Apply topically as needed.    No facility-administered encounter medications on file as of 11/26/2018.     No Known Allergies  Review of Systems  Constitutional: Positive for fever. Negative for activity change, chills and fatigue.  HENT: Negative for congestion.   Respiratory: Negative for cough and shortness of breath.   Cardiovascular: Negative for chest pain, palpitations and leg swelling.  Gastrointestinal: Negative for abdominal pain, constipation, diarrhea, nausea and vomiting.  Genitourinary: Negative for decreased urine volume, difficulty urinating, dysuria, flank pain, frequency and urgency.  Musculoskeletal: Negative for arthralgias and myalgias.  Neurological: Negative for dizziness, weakness, light-headedness and headaches.  Psychiatric/Behavioral: Negative for confusion.  All other systems reviewed and are negative.        Observations/Objective: No vital signs or physical exam, this was a telephone or virtual health encounter.  Pt alert and oriented, answers all questions appropriately, and able to speak in full sentences.    Assessment and Plan: Vega was seen  today for fever.  Diagnoses and all orders for this visit:  Fever in adult Continue antibiotics as prescribed by surgeon. Alternate tylenol and motrin as needed for fever control. Report any new or worsening symptoms.     Follow Up Instructions: Return if symptoms worsen or fail to improve.    I discussed the assessment and treatment plan with the patient. The patient was provided an opportunity to ask questions and all were answered. The patient agreed with the plan and demonstrated an understanding of the instructions.   The patient was advised to call back or seek an in-person evaluation if the symptoms worsen or if the condition fails to improve as anticipated.  The above assessment and management plan was discussed with the patient. The patient verbalized understanding of and has agreed to the management plan. Patient is aware to call the clinic if symptoms persist or worsen. Patient is aware when to return to the  clinic for a follow-up visit. Patient educated on when it is appropriate to go to the emergency department.    I provided 15 minutes of non-face-to-face time during this encounter. The call started at 1515. The call ended at 1530.   Monia Pouch, FNP-C [facility] [address] [place], [place] [address] [phone]

## 2018-11-30 ENCOUNTER — Telehealth: Payer: Self-pay | Admitting: Obstetrics & Gynecology

## 2018-11-30 NOTE — Telephone Encounter (Signed)
Ruby with [employer], called, they need to know if the patient had any diagnosis of cancer prior to 10/23/2018.  Claim/ref # [id number]  [id number]

## 2018-12-01 ENCOUNTER — Telehealth: Payer: Self-pay | Admitting: *Deleted

## 2018-12-01 NOTE — Telephone Encounter (Signed)
LVOVM with [employer].

## 2018-12-09 ENCOUNTER — Other Ambulatory Visit: Payer: Self-pay

## 2018-12-10 ENCOUNTER — Inpatient Hospital Stay (HOSPITAL_COMMUNITY): Payer: BC Managed Care – PPO | Attending: Hematology | Admitting: Hematology

## 2018-12-10 ENCOUNTER — Encounter (HOSPITAL_COMMUNITY): Payer: Self-pay | Admitting: Hematology

## 2018-12-10 VITALS — BP 113/79 | HR 112 | Temp 98.8°F | Resp 18 | Wt 202.0 lb

## 2018-12-10 DIAGNOSIS — C50512 Malignant neoplasm of lower-outer quadrant of left female breast: Secondary | ICD-10-CM | POA: Diagnosis not present

## 2018-12-10 DIAGNOSIS — Z170 Estrogen receptor positive status [ER+]: Secondary | ICD-10-CM | POA: Diagnosis not present

## 2018-12-10 MED ORDER — ANASTROZOLE 1 MG PO TABS: 1.0000 mg | ORAL_TABLET | Freq: Every day | ORAL | 6 refills | Status: DC

## 2018-12-10 NOTE — Patient Instructions (Addendum)
[place] at [facility] Discharge Instructions  You were seen today by Dr. Delton Coombes. He went over your recent lab results. You will be able to take Arimidex instead of getting chemotherapy, you will need to see the radiation doctor to find out if you will need radiation or not. You will take the Arimidex every day for the next five years or more. He will see you back in  for labs and follow up.   Thank you for choosing [place] at [facility] to provide your oncology and hematology care.  To afford each patient quality time with our provider, please arrive at least 15 minutes before your scheduled appointment time.   If you have a lab appointment with the [facility] please come in thru the  Main Entrance and check in at the main information desk  You need to re-schedule your appointment should you arrive 10 or more minutes late.  We strive to give you quality time with our providers, and arriving late affects you and other patients whose appointments are after yours.  Also, if you no show three or more times for appointments you may be dismissed from the clinic at the providers discretion.     Again, thank you for choosing [facility].  Our hope is that these requests will decrease the amount of time that you wait before being seen by our physicians.       _____________________________________________________________  Should you have questions after your visit to [facility], please contact our office at [phone] between the hours of 8:00 a.m. and 4:30 p.m.  Voicemails left after 4:00 p.m. will not be returned until the following business day.  For prescription refill requests, have your pharmacy contact our office and allow 72 hours.    Cancer Center Support Programs:   > Cancer Support Group  2nd Tuesday of the month 1pm-2pm, Journey Room

## 2018-12-10 NOTE — Progress Notes (Signed)
FMLA papers completed and given to patient during her office visit today.

## 2018-12-10 NOTE — Progress Notes (Signed)
Anna Carney, [place] [address]   CLINIC:  Medical Oncology/Hematology  PCP:  Anna Sleeper, PA-C [place] [address] [phone]   REASON FOR VISIT:  Follow-up for breast cancer     INTERVAL HISTORY:  [name] 68 y.o. female returns for routine follow-up and consideration for next cycle of chemotherapy. She is here today alone. She states that her incision was open and she has been to her surgeon about this. Denies any nausea, vomiting, or diarrhea. Denies any new pains. Had not noticed any recent bleeding such as epistaxis, hematuria or hematochezia. Denies recent chest pain on exertion, shortness of breath on minimal exertion, pre-syncopal episodes, or palpitations. Denies any numbness or tingling in hands or feet. Denies any recent fevers, infections, or recent hospitalizations. Patient reports appetite at 100% and energy level at 100%.    REVIEW OF SYSTEMS:  Review of Systems  Musculoskeletal: Positive for arthralgias.  All other systems reviewed and are negative.    PAST MEDICAL/SURGICAL HISTORY:  Past Medical History:  Diagnosis Date  . Anxiety   . Arthritis   . Cancer ([place]) 10/2018   left breast IDC  . Shingles    Past Surgical History:  Procedure Laterality Date  . CHOLECYSTECTOMY    . MASTECTOMY W/ SENTINEL NODE BIOPSY Left 11/12/2018   Procedure: LEFT MASTECTOMY WITH LEFT AXILLARY SENTINEL LYMPH NODE BIOPSY;  Surgeon: Rolm Bookbinder, MD;  Location: [facility];  Service: General;  Laterality: Left;     SOCIAL HISTORY:  Social History   Socioeconomic History  . Marital status: Single    Spouse name: Not on file  . Number of children: Not on file  . Years of education: Not on file  . Highest education level: Not on file  Occupational History  . Occupation: [employer]    Comment: in garden center  . 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 Financial resource strain: Not hard at all  . Food insecurity:    Worry: Never true    Inability: Never true  . Transportation needs:    Medical: No    Non-medical: No  Tobacco Use  . Smoking status: Former Research scientist (life sciences)  . Smokeless tobacco: Never Used  Substance and Sexual Activity  . Alcohol use: Yes    Comment: rarely  . Drug use: No  . Sexual activity: Not Currently    Birth control/protection: Post-menopausal  Lifestyle  . Physical activity:    Days per week: Not on file    Minutes per session: Not on file  . Stress: Rather much  Relationships  . Social connections:    Talks on phone: Not on file    Gets together: Not on file    Attends religious service: Not on file    Active member of club or organization: Not on file    Attends meetings of clubs or organizations: Not on file    Relationship status: Not on file  . Intimate partner violence:    Fear of current or ex partner: Not on file    Emotionally abused: Not on file    Physically abused: Not on file    Forced sexual activity: Not on file  Other Topics Concern  . Not on file  Social History Narrative  . Not on file    FAMILY HISTORY:  Family History  Problem Relation Age of Onset  .  Arthritis Mother   . Diabetes Mother   . Stroke Mother   . Asthma Father   . Colon cancer Brother   . Arthritis Daughter   . Thyroid disease Daughter   . Breast cancer Maternal Aunt     CURRENT MEDICATIONS:  Outpatient Encounter Medications as of 12/10/2018  Medication Sig  . acetaminophen (TYLENOL) 325 MG tablet Take 650 mg by mouth every 6 (six) hours as needed.  . ALPRAZolam (XANAX) 0.25 MG tablet Take 1 tablet (0.25 mg total) by mouth 2 (two) times daily as needed for anxiety.  . Ascorbic Acid (VITAMIN C) 1000 MG tablet Take 2,000 mg by mouth daily.  . cholecalciferol (VITAMIN D3) 25 MCG (1000 UT) tablet Take 2,000 Units by mouth daily.  . Multiple Vitamin (MULTIVITAMIN) tablet Take 1 tablet by  mouth daily.  Loura Pardon Salicylate (ASPERCREME EX) Apply topically as needed.   [name] anastrozole (ARIMIDEX) 1 MG tablet Take 1 tablet (1 mg total) by mouth daily.  . hydrochlorothiazide (HYDRODIURIL) 25 MG tablet Take 1 tablet (25 mg total) by mouth daily. (Patient not taking: Reported on 12/10/2018)  . methocarbamol (ROBAXIN) 500 MG tablet Take 1 tablet (500 mg total) by mouth 3 (three) times daily. (Patient not taking: Reported on 12/10/2018)  . oxyCODONE (OXY IR/ROXICODONE) 5 MG immediate release tablet Take 1 tablet (5 mg total) by mouth every 4 (four) hours as needed for moderate pain. (Patient not taking: Reported on 12/10/2018)  . silver sulfADIAZINE (SILVADENE) 1 % cream Apply 1 application topically daily. Thin layer to area twice daily (Patient not taking: Reported on 12/10/2018)   No facility-administered encounter medications on file as of 12/10/2018.     ALLERGIES:  No Known Allergies   PHYSICAL EXAM:  ECOG Performance status: 1  Vitals:   12/10/18 1024  BP: 113/79  Pulse: (!) 112  Resp: 18  Temp: 98.8 F (37.1 C)  SpO2: 95%   Filed Weights   12/10/18 1024  Weight: 202 lb (91.6 kg)    Physical Exam Vitals signs reviewed.  Constitutional:      Appearance: Normal appearance.  Cardiovascular:     Rate and Rhythm: Normal rate and regular rhythm.     Heart sounds: Normal heart sounds.  Pulmonary:     Breath sounds: Normal breath sounds.  Abdominal:     General: There is no distension.     Palpations: Abdomen is soft. There is no mass.  Musculoskeletal:        General: No swelling.  Skin:    General: Skin is warm.  Neurological:     Mental Status: She is alert and oriented to person, place, and time.  Psychiatric:        Mood and Affect: Mood normal.        Behavior: Behavior normal.    Left mastectomy area has some breakdown of the wound without any infection.  There is serosanguineous discharge.  LABORATORY DATA:  I have reviewed the labs as listed.  CBC     Component Value Date/Time   WBC 6.7 10/21/2018 1657   RBC 4.44 10/21/2018 1657   HGB 13.8 10/21/2018 1657   HCT 40.1 10/21/2018 1657   PLT 245 10/21/2018 1657   MCV 90 10/21/2018 1657   MCH 31.1 10/21/2018 1657   MCHC 34.4 10/21/2018 1657   RDW 11.8 10/21/2018 1657   LYMPHSABS 1.6 10/21/2018 1657   EOSABS 0.2 10/21/2018 1657   BASOSABS 0.1 10/21/2018 1657   CMP Latest Ref Rng & Units  10/21/2018  Glucose 65 - 99 mg/dL 105(H)  BUN 8 - 27 mg/dL 14  Creatinine 0.57 - 1.00 mg/dL 0.65  Sodium 134 - 144 mmol/L 144  Potassium 3.5 - 5.2 mmol/L 3.9  Chloride 96 - 106 mmol/L 106  CO2 20 - 29 mmol/L 23  Calcium 8.7 - 10.3 mg/dL 9.3  Total Protein 6.0 - 8.5 g/dL 6.6  Total Bilirubin 0.0 - 1.2 mg/dL 0.4  Alkaline Phos 39 - 117 IU/L 80  AST 0 - 40 IU/L 21  ALT 0 - 32 IU/L 14       DIAGNOSTIC IMAGING:  I have independently reviewed the scans and discussed with the patient.   I have reviewed Venita Lick LPN's note and agree with the documentation.  I personally performed a face-to-face visit, made revisions and my assessment and plan is as follows.    ASSESSMENT & PLAN:   Malignant neoplasm of lower-outer quadrant of left breast of female, estrogen receptor positive (HCC) 1.  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 -Biopsy of the left breast on 10/20/2018 showed IDC.  Left axillary node core biopsy was negative. - Status post left mastectomy on 11/12/2018, 11 cm IDC, grade 2, margins negative, 0/14 lymph nodes positive, ER/PR positive, HER-2 2+ by IHC and negative by FISH, Ki-67 10%. -Oncotype DX recurrence score of 2. - She still has some slightly open area at the wound site.  She has follow-up with Dr. Donne Hazel next week. - We talked about the pathology report and Oncotype DX report in detail. -I have recommended against chemotherapy.  She will be a candidate for antiestrogen therapy  with anastrozole.  I have made a referral to radiation oncology in [place]. - We talked about the side effects of anastrozole in detail.  I have recommended a baseline bone density study. -She was also recommended to take calcium and vitamin D twice daily. -I will see her back in 2 months to see how she is tolerating anastrozole.  2.  Family history: -Maternal aunt had breast cancer.  Maternal half brother died of colon cancer.   Total time spent is 40 minutes with more than 50% of the time spent face-to-face discussing pathology report, Oncotype DX report, treatment options and coordination of care.  Orders placed this encounter:  Orders Placed This Encounter  Procedures  . DG Bone Density  . CBC with Differential/Platelet  . Comprehensive metabolic panel  . Vitamin D 25 hydroxy      Derek Jack, MD [place], [place] [phone]

## 2018-12-10 NOTE — Assessment & Plan Note (Addendum)
1.  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 -Biopsy of the left breast on 10/20/2018 showed IDC.  Left axillary node core biopsy was negative. - Status post left mastectomy on 11/12/2018, 11 cm IDC, grade 2, margins negative, 0/14 lymph nodes positive, ER/PR positive, HER-2 2+ by IHC and negative by FISH, Ki-67 10%. -Oncotype DX recurrence score of 2. - She still has some slightly open area at the wound site.  She has follow-up with Dr. Donne Hazel next week. - We talked about the pathology report and Oncotype DX report in detail. -I have recommended against chemotherapy.  She will be a candidate for antiestrogen therapy with anastrozole.  I have made a referral to radiation oncology in [place]. - We talked about the side effects of anastrozole in detail.  I have recommended a baseline bone density study. -She was also recommended to take calcium and vitamin D twice daily. -I will see her back in 2 months to see how she is tolerating anastrozole.  2.  Family history: -Maternal aunt had breast cancer.  Maternal half brother died of colon cancer.

## 2018-12-11 NOTE — Progress Notes (Signed)
Location of Breast Cancer: Malignant neoplasm of lower-outer quadrant of left female breast  Plan: Start after all clear from Dr. Wakefield.  SIM 5/15 or early the next week, 6.5 weeks of radiation treatment, anastrozole.  ? PT referral.  Status post left mastectomy on 11/12/2018, 11 cm IDC, grade 2, margins negative, 0/14 lymph nodes positive, ER/PR positive, HER-2 2+ by IHC and negative by FISH, Ki-67 10%.  Did patient present with symptoms (if so, please note symptoms) or was this found on screening mammography?: Patient has known of breast mass for the past 3 years, neglected it.  She noted skin break down with foul smelling drainage and was evaluated by Dr. Eure.  PET 11/03/2018: large hypermetabolic left breast mass with no evidence of axillary or thoracic nodal metastasis.  No distant metastatic disease.  Mammogram 10/20/2018: irregular hypoechoic mass at 12:30 position in the right breast measuring 1.5 x 0.5 x 3.1 cm.  Left breast shows very large mass involving the entire central/retroareolar left breast and lower outer quadrant.  Mass could not fit entirely in the field of view.  Retroareolar location measured 4.7 x 2.6 x 5.3 cm.  A small satellite nodule is identified measuring approximately 0.7 cm.  There is an indeterminate lymph node with slight nodular cortical thickening in the left axilla with cortical thickness measuring up to 0.4 cm.  Histology per Pathology Report: Left Breast Mastectomy 11/12/2018   Left Breast Biopsy 10/20/2018  Receptor Status: ER(+100%), PR (+100%), Her2-neu (-), Ki-67(10%)   Past/Anticipated interventions by surgeon, if any: Dr. Wakefield 11/12/2018 -She has a t4 tumor, I do think resectable.  It's not aggressive tumor so I think proceeding with mastectomy as discussed is only option.   -I will consider sn biopsy although t4 tumor or low lying alnd is cannot localize or get appropriate number of nodes. -Will await PET and if positive for stage 4 disease will  just do mastectomy at that point. -Left Mastectomy 11/12/2018  Past/Anticipated interventions by medical oncology, if any: Chemotherapy  Dr. Katragadda 12/10/2018 -Oncotype DX recurrence score of 2. -She still has some slightly open area at the wound site.  She has a follow-up appointment with Dr. Wakefield next week. -I have recommended against chemotherapy.  She will be a candidate for antiestrogen therapy with anastrozole.  -I have made a referral to radiation oncology in .   Lymphedema issues, if any:  No  Pain issues, if any:  Some in the beginning, says sometimes it feels fat.  SAFETY ISSUES:  Prior radiation? No  Pacemaker/ICD? No  Possible current pregnancy? Postmenopausal  Is the patient on methotrexate? No  Current Complaints / other details:   -Maternal Aunt had breast cancer. -Maternal half brother died of colon cancer.  -2 open areas at incision site, little drainage. Will see Wakefield on 12/31/2018.     Silva, LaToya M, RN 12/11/2018,8:50 AM   

## 2018-12-14 ENCOUNTER — Encounter (HOSPITAL_BASED_OUTPATIENT_CLINIC_OR_DEPARTMENT_OTHER): Payer: Self-pay | Admitting: General Surgery

## 2018-12-15 ENCOUNTER — Ambulatory Visit
Admission: RE | Admit: 2018-12-15 | Discharge: 2018-12-15 | Disposition: A | Discharge status: Home / Self Care | Payer: BC Managed Care – PPO | Source: Ambulatory Visit | Attending: Radiation Oncology | Admitting: Radiation Oncology

## 2018-12-15 ENCOUNTER — Other Ambulatory Visit: Payer: Self-pay

## 2018-12-15 ENCOUNTER — Encounter: Payer: Self-pay | Admitting: Radiation Oncology

## 2018-12-15 DIAGNOSIS — C50512 Malignant neoplasm of lower-outer quadrant of left female breast: Secondary | ICD-10-CM

## 2018-12-15 DIAGNOSIS — Z170 Estrogen receptor positive status [ER+]: Principal | ICD-10-CM

## 2018-12-15 NOTE — Progress Notes (Signed)
Radiation Oncology         (336) [phone] ________________________________  Name: Anna Carney        MRN: [id number]  Date of Service: 12/15/2018 DOB: 10/07/1950  RK:YHCWC, Londell Moh, PA-C  Derek Jack, MD     REFERRING PHYSICIAN: Derek Jack, MD   DIAGNOSIS: The encounter diagnosis was Malignant neoplasm of lower-outer quadrant of left breast of female, estrogen receptor positive ([place]).   HISTORY OF PRESENT ILLNESS: Anna Carney is a 68 y.o. female seen at the request of Dr. Delton Coombes for a newly diagnosed left breast cancer. The patient had a mass in the left breast that began to bleed and was fungating through the skin. On 10/20/2018 diagnostic imaging of the breast revealed a 4.7 x 2.6 x 5.3 cm mass in the retroareolar breast and a small 7 mm satellite nodule was noted. There was an indeterminate left axillary node with cortical thickening measuring 4 mm. The right breast also noted a 1.5 x .5 x 3.1 cm at 12:30. She underwent biopsy on 10/20/2018 that reveald a fibroadenoma in the right breast. 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 Her left axillary node was also sampled and was negative for disease. She underwent left mastectomy with axillary lymph node dissection on 11/12/2018. Final pathology revealed an 11 cm grade 2 invasive ductal carcinoma of the left breast and 14 nodes were sampled and none contained any disease. She had an oncotype dx score of 2, and will not need chemotherapy. She has been started on Arimidex, and is seen via Webex encounter to discuss the options of adjuvant radiotherapy.    PREVIOUS RADIATION THERAPY: No   PAST MEDICAL HISTORY:  Past Medical History:  Diagnosis Date  . Anxiety   . Arthritis   . Cancer ([place]) 10/2018   left breast IDC  . Shingles        PAST SURGICAL HISTORY: Past Surgical History:  Procedure Laterality Date  . CHOLECYSTECTOMY    .  MASTECTOMY W/ SENTINEL NODE BIOPSY Left 11/12/2018   Procedure: LEFT MASTECTOMY WITH LEFT AXILLARY SENTINEL LYMPH NODE BIOPSY;  Surgeon: Rolm Bookbinder, MD;  Location: [facility];  Service: General;  Laterality: Left;     FAMILY HISTORY:  Family History  Problem Relation Age of Onset  . Arthritis Mother   . Diabetes Mother   . Stroke Mother   . Asthma Father   . Colon cancer Brother   . Arthritis Daughter   . Thyroid disease Daughter   . Breast cancer Maternal Aunt      SOCIAL HISTORY:  reports that she has quit smoking. She has never used smokeless tobacco. She reports current alcohol use. She reports that she does not use drugs.   ALLERGIES: Patient has no known allergies.   MEDICATIONS:  Current Outpatient Medications  Medication Sig Dispense Refill  . acetaminophen (TYLENOL) 325 MG tablet Take 650 mg by mouth every 6 (six) hours as needed.    . ALPRAZolam (XANAX) 0.25 MG tablet Take 1 tablet (0.25 mg total) by mouth 2 (two) times daily as needed for anxiety. 20 tablet 1  . anastrozole (ARIMIDEX) 1 MG tablet Take 1 tablet (1 mg total) by mouth daily. 30 tablet 6  . Ascorbic Acid (VITAMIN C) 1000 MG tablet Take 2,000 mg by mouth daily.    . calcium carbonate (OS-CAL) 1250 (500 Ca) MG chewable tablet Chew 1 tablet by mouth daily.    [name]  cholecalciferol (VITAMIN D3) 25 MCG (1000 UT) tablet Take 2,000 Units by mouth daily.    . Multiple Vitamin (MULTIVITAMIN) tablet Take 1 tablet by mouth daily.    [name] oxyCODONE (OXY IR/ROXICODONE) 5 MG immediate release tablet Take 1 tablet (5 mg total) by mouth every 4 (four) hours as needed for moderate pain. 12 tablet 0  . silver sulfADIAZINE (SILVADENE) 1 % cream Apply 1 application topically daily. Thin layer to area twice daily 50 g 11  . Trolamine Salicylate (ASPERCREME EX) Apply topically as needed.      No current facility-administered medications for this encounter.      REVIEW OF SYSTEMS: On review of systems, the  patient reports that she is doing well overall. She reports she is healing well. She denies any chest pain, shortness of breath, cough, fevers, chills, night sweats, unintended weight changes. She denies any bowel or bladder disturbances, and denies abdominal pain, nausea or vomiting. She denies any new musculoskeletal or joint aches or pains. A complete review of systems is obtained and is otherwise negative.     PHYSICAL EXAM:  Wt Readings from Last 3 Encounters:  12/10/18 202 lb (91.6 kg)  11/12/18 206 lb 2.1 oz (93.5 kg)  11/04/18 208 lb (94.3 kg)   Temp Readings from Last 3 Encounters:  12/10/18 98.8 F (37.1 C) (Oral)  11/13/18 98.7 F (37.1 C)  11/04/18 98 F (36.7 C) (Oral)   BP Readings from Last 3 Encounters:  12/10/18 113/79  11/13/18 (!) 107/59  11/04/18 (!) 148/82   Pulse Readings from Last 3 Encounters:  12/10/18 (!) 112  11/13/18 (!) 59  11/04/18 80   Pain Assessment Pain Score: 3  Pain Frequency: Intermittent(Pain when walking, has arthritis in knees.)/10  In general this is a well appearing caucasian female in no acute distress. She's alert and oriented x4 and appropriate throughout the examination. Cardiopulmonary assessment is negative for acute distress and she exhibits normal effort. Chest wall exam is deferred to her simulation.    ECOG = 1  0 - Asymptomatic (Fully active, able to carry on all predisease activities without restriction)  1 - Symptomatic but completely ambulatory (Restricted in physically strenuous activity but ambulatory and able to carry out work of a light or sedentary nature. For example, light housework, office work)  2 - Symptomatic, <50% in bed during the day (Ambulatory and capable of all self care but unable to carry out any work activities. Up and about more than 50% of waking hours)  3 - Symptomatic, >50% in bed, but not bedbound (Capable of only limited self-care, confined to bed or chair 50% or more of waking hours)  4 -  Bedbound (Completely disabled. Cannot carry on any self-care. Totally confined to bed or chair)  5 - Death   Eustace Pen MM, Creech RH, Tormey DC, et al. [phone]). "Toxicity and response criteria of the [facility]". [place] Oncol. 5 (6): 649-55    LABORATORY DATA:  Lab Results  Component Value Date   WBC 6.7 10/21/2018   HGB 13.8 10/21/2018   HCT 40.1 10/21/2018   MCV 90 10/21/2018   PLT 245 10/21/2018   Lab Results  Component Value Date   NA 144 10/21/2018   K 3.9 10/21/2018   CL 106 10/21/2018   CO2 23 10/21/2018   Lab Results  Component Value Date   ALT 14 10/21/2018   AST 21 10/21/2018   ALKPHOS 80 10/21/2018   BILITOT 0.4 10/21/2018  RADIOGRAPHY: No results found.     IMPRESSION/PLAN: 1. 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 Dr. Lisbeth Renshaw discusses the pathology findings and reviews the nature of invasive left breast disease. Dr. Lisbeth Renshaw reviews that she would benefit from adjuvant radiotherapy as well as antiestrogen therapy. We discussed the risks, benefits, short, and long term effects of radiotherapy, and the patient is interested in proceeding. Dr. Lisbeth Renshaw discusses the delivery and logistics of radiotherapy and anticipates a course of 6 1/2 weeks of radiotherapy to the chest wall and regional nodes with deep inspiration breath hold technique. She is still healing and will see Dr. Donne Hazel on 12/31/2018. We will plan simulation once she's been cleared by him, and anticipate starting treatment in late May 2020.    This encounter was provided by telemedicine platform Webex.  The patient has given verbal consent for this type of encounter and has been advised to only accept a meeting of this type in a secure network environment. The time spent during this encounter was 45 minutes. The attendants for this meeting include Blenda Nicely, RN, Dr. Lisbeth Renshaw, Hayden Pedro  and [name] along  with her two daughters Sharyn Lull and [place].  During the encounter,  Blenda Nicely, RN, Dr. Lisbeth Renshaw, and Hayden Pedro were located at [facility] Radiation Oncology Department.  Anna Carney was located at home along with her daughters Sharyn Lull and  [place].   The above documentation reflects my direct findings during this shared patient visit. Please see the separate note by Dr. Lisbeth Renshaw on this date for the remainder of the patient's plan of care.    Carola Rhine, PAC

## 2018-12-25 ENCOUNTER — Other Ambulatory Visit: Payer: Self-pay

## 2018-12-25 ENCOUNTER — Encounter: Payer: Self-pay | Admitting: Physician Assistant

## 2018-12-25 ENCOUNTER — Ambulatory Visit (INDEPENDENT_AMBULATORY_CARE_PROVIDER_SITE_OTHER): Payer: BC Managed Care – PPO | Admitting: Physician Assistant

## 2018-12-25 DIAGNOSIS — C50512 Malignant neoplasm of lower-outer quadrant of left female breast: Secondary | ICD-10-CM

## 2018-12-25 DIAGNOSIS — F320 Major depressive disorder, single episode, mild: Secondary | ICD-10-CM | POA: Diagnosis not present

## 2018-12-25 DIAGNOSIS — N3000 Acute cystitis without hematuria: Secondary | ICD-10-CM | POA: Diagnosis not present

## 2018-12-25 DIAGNOSIS — Z170 Estrogen receptor positive status [ER+]: Secondary | ICD-10-CM | POA: Diagnosis not present

## 2018-12-25 MED ORDER — SULFAMETHOXAZOLE-TRIMETHOPRIM 800-160 MG PO TABS: 1.0000 | ORAL_TABLET | Freq: Two times a day (BID) | ORAL | 0 refills | Status: DC

## 2018-12-25 MED ORDER — ESCITALOPRAM OXALATE 5 MG PO TABS: 5.0000 mg | ORAL_TABLET | Freq: Every day | ORAL | 1 refills | Status: DC

## 2018-12-28 NOTE — Progress Notes (Signed)
Telephone visit  Subjective: CC:uti PCP: Terald Sleeper, PA-C YDX:AJOINOM [place] is a 68 y.o. female calls for telephone consult today. Patient provides verbal consent for consult held via phone.  Patient is identified with 2 separate identifiers.  At this time the entire area is on COVID-19 social distancing and stay home orders are in place.  Patient is of higher risk and therefore we are performing this by a virtual method.  Location of patient: home Location of provider: [facility] Others present for call: daughter Sharyn Lull  This patient has had several days of dysuria, frequency and nocturia. There is also pain over the bladder in the suprapubic region, no back pain. Denies leakage or hematuria.  Denies fever or chills. No pain in flank area.  She also reports feeling very down and depressed with her cancer treatment going on.  There is on the telephone with her and they definitely reported a decrease in her mood.  She is crying significantly.  She has never really had to take anything for depression in the past.   ROS: Per HPI  No Known Allergies Past Medical History:  Diagnosis Date  . Anxiety   . Arthritis   . Cancer ([place]) 10/2018   left breast IDC  . Shingles     Current Outpatient Medications:  .  acetaminophen (TYLENOL) 325 MG tablet, Take 650 mg by mouth every 6 (six) hours as needed., Disp: , Rfl:  .  ALPRAZolam (XANAX) 0.25 MG tablet, Take 1 tablet (0.25 mg total) by mouth 2 (two) times daily as needed for anxiety., Disp: 20 tablet, Rfl: 1 .  anastrozole (ARIMIDEX) 1 MG tablet, Take 1 tablet (1 mg total) by mouth daily., Disp: 30 tablet, Rfl: 6 .  Ascorbic Acid (VITAMIN C) 1000 MG tablet, Take 2,000 mg by mouth daily., Disp: , Rfl:  .  calcium carbonate (OS-CAL) 1250 (500 Ca) MG chewable tablet, Chew 1 tablet by mouth daily., Disp: , Rfl:  .  cholecalciferol (VITAMIN D3) 25 MCG (1000 UT) tablet, Take 2,000 Units by mouth daily., Disp: , Rfl:  .  escitalopram  (LEXAPRO) 5 MG tablet, Take 1 tablet (5 mg total) by mouth daily., Disp: 30 tablet, Rfl: 1 .  Multiple Vitamin (MULTIVITAMIN) tablet, Take 1 tablet by mouth daily., Disp: , Rfl:  .  oxyCODONE (OXY IR/ROXICODONE) 5 MG immediate release tablet, Take 1 tablet (5 mg total) by mouth every 4 (four) hours as needed for moderate pain., Disp: 12 tablet, Rfl: 0 .  silver sulfADIAZINE (SILVADENE) 1 % cream, Apply 1 application topically daily. Thin layer to area twice daily, Disp: 50 g, Rfl: 11 .  sulfamethoxazole-trimethoprim (BACTRIM DS) 800-160 MG tablet, Take 1 tablet by mouth 2 (two) times daily., Disp: 20 tablet, Rfl: 0 .  Trolamine Salicylate (ASPERCREME EX), Apply topically as needed. , Disp: , Rfl:   Assessment/ Plan: 68 y.o. female   1. Acute cystitis without hematuria - sulfamethoxazole-trimethoprim (BACTRIM DS) 800-160 MG tablet; Take 1 tablet by mouth 2 (two) times daily.  Dispense: 20 tablet; Refill: 0  2. Malignant neoplasm of lower-outer quadrant of left breast of female, estrogen receptor positive ([place]) Continue oncology treatment  3. Depression, major, single episode, mild ([facility]) - escitalopram (LEXAPRO) 5 MG tablet; Take 1 tablet (5 mg total) by mouth daily.  Dispense: 30 tablet; Refill: 1   Start time: 2:42 PM End time: 2:50 PM  Meds ordered this encounter  Medications  . sulfamethoxazole-trimethoprim (BACTRIM DS) 800-160 MG tablet  Sig: Take 1 tablet by mouth 2 (two) times daily.    Dispense:  20 tablet    Refill:  0    Order Specific Question:   Supervising Provider    Answer:   Janora Norlander [[id number]]  . escitalopram (LEXAPRO) 5 MG tablet    Sig: Take 1 tablet (5 mg total) by mouth daily.    Dispense:  30 tablet    Refill:  1    Order Specific Question:   Supervising Provider    Answer:   Janora Norlander [[id number]]    Particia Nearing PA-C [place] [phone]

## 2018-12-30 ENCOUNTER — Ambulatory Visit (INDEPENDENT_AMBULATORY_CARE_PROVIDER_SITE_OTHER): Payer: BC Managed Care – PPO | Admitting: Family Medicine

## 2018-12-30 ENCOUNTER — Encounter: Payer: Self-pay | Admitting: Family Medicine

## 2018-12-30 ENCOUNTER — Other Ambulatory Visit: Payer: Self-pay

## 2018-12-30 DIAGNOSIS — N3090 Cystitis, unspecified without hematuria: Secondary | ICD-10-CM | POA: Diagnosis not present

## 2018-12-30 MED ORDER — DOXYCYCLINE HYCLATE 100 MG PO CAPS: 100.0000 mg | ORAL_CAPSULE | Freq: Two times a day (BID) | ORAL | 0 refills | Status: DC

## 2018-12-30 NOTE — Progress Notes (Signed)
Virtual Visit via telephone Note  I discussed the limitations, risks, security and privacy concerns of performing an evaluation and management service by telephone and the availability of in person appointments. I also discussed with the patient that there may be a patient responsible charge related to this service. The patient expressed understanding and agreed to proceed. Pt. Is at home. Dr. Livia Snellen is in his office.    HPI  Patient presents today for continued fever. Still frequency. Nocturia X 1-2 (which is normal for her.) No dysuria. No urgency. Having severe nausea. Can't tolerate the bactrim. Makes her too nauseous.However, she Took doxycycline in the past with good results for UTI after a surgery. Interested in trying it again. Visit here last week virtually for UTI, so no culture could be obtained. Bactrim was prescribed.  PMH: Smoking status noted ROS: Per HPI  Objective: There were no vitals taken for this visit. .Exam deferred. Pt. Harboring due to COVID 19. Phone visit performed.  Assessment and plan:  1. Cystitis     Meds ordered this encounter  Medications  . doxycycline (VIBRAMYCIN) 100 MG capsule    Sig: Take 1 capsule (100 mg total) by mouth 2 (two) times daily.    Dispense:  20 capsule    Refill:  0    Chart marked for sulfa reaction  Follow up as needed.   Follow Up Instructions:   I discussed the assessment and treatment plan with the patient. The patient was provided an opportunity to ask questions and all were answered. The patient agreed with the plan and demonstrated an understanding of the instructions.   The patient was advised to call back or seek an in-person evaluation if the symptoms worsen or if the condition fails to improve as anticipated.   Total minutes including chart review and phone contact time: 10

## 2019-01-12 ENCOUNTER — Other Ambulatory Visit: Payer: Self-pay

## 2019-01-12 ENCOUNTER — Encounter: Payer: Self-pay | Admitting: Physician Assistant

## 2019-01-12 ENCOUNTER — Ambulatory Visit (INDEPENDENT_AMBULATORY_CARE_PROVIDER_SITE_OTHER): Payer: BC Managed Care – PPO | Admitting: Physician Assistant

## 2019-01-12 DIAGNOSIS — C50512 Malignant neoplasm of lower-outer quadrant of left female breast: Secondary | ICD-10-CM | POA: Diagnosis not present

## 2019-01-12 DIAGNOSIS — F320 Major depressive disorder, single episode, mild: Secondary | ICD-10-CM

## 2019-01-12 DIAGNOSIS — Z170 Estrogen receptor positive status [ER+]: Secondary | ICD-10-CM | POA: Diagnosis not present

## 2019-01-12 MED ORDER — ESCITALOPRAM OXALATE 5 MG PO TABS: 5.0000 mg | ORAL_TABLET | Freq: Every day | ORAL | 5 refills | Status: DC

## 2019-01-12 NOTE — Progress Notes (Signed)
Telephone visit  Subjective: ON:GEXBMWUXLK and breast cancer PCP: Terald Sleeper, PA-C GMW:NUUVOZD Boyden is a 68 y.o. female calls for telephone consult today. Patient provides verbal consent for consult held via phone.  Patient is identified with 2 separate identifiers.  At this time the entire area is on COVID-19 social distancing and stay home orders are in place.  Patient is of higher risk and therefore we are performing this by a virtual method.  Location of patient: home Location of provider: HOME Others present for call: no  This is a recheck for the patient's depression and breast cancer.  She states that she is feeling a lot better since starting the Celexa.  She states that her daughters have noticed a difference also.  She would like to continue the medication at this time.  I will send refills into the pharmacy.  She is still undergoing treatment for her breast cancer.  Her surgery has nearly healed.  She will be seeing the surgeon later this week and will probably be released to move on to the radiation treatments.  She had been on FMLA for this through the surgeons.  She works a part-time job at [employer] and has asked if we could give her a COVID-19 weekly for 14 days in the future if necessary.  At this time she is not did not use it but was told by her administration at [facility] that she would qualify for this due to her cancer diagnosis.   ROS: Per HPI  Allergies  Allergen Reactions  . Sulfa Antibiotics Nausea Only   Past Medical History:  Diagnosis Date  . Anxiety   . Arthritis   . Cancer ([place]) 10/2018   left breast IDC  . Shingles     Current Outpatient Medications:  .  acetaminophen (TYLENOL) 325 MG tablet, Take 650 mg by mouth every 6 (six) hours as needed., Disp: , Rfl:  .  ALPRAZolam (XANAX) 0.25 MG tablet, Take 1 tablet (0.25 mg total) by mouth 2 (two) times daily as needed for anxiety., Disp: 20 tablet, Rfl: 1 .  anastrozole (ARIMIDEX) 1 MG tablet,  Take 1 tablet (1 mg total) by mouth daily., Disp: 30 tablet, Rfl: 6 .  Ascorbic Acid (VITAMIN C) 1000 MG tablet, Take 2,000 mg by mouth daily., Disp: , Rfl:  .  calcium carbonate (OS-CAL) 1250 (500 Ca) MG chewable tablet, Chew 1 tablet by mouth daily., Disp: , Rfl:  .  cholecalciferol (VITAMIN D3) 25 MCG (1000 UT) tablet, Take 2,000 Units by mouth daily., Disp: , Rfl:  .  escitalopram (LEXAPRO) 5 MG tablet, Take 1 tablet (5 mg total) by mouth daily., Disp: 30 tablet, Rfl: 5 .  Multiple Vitamin (MULTIVITAMIN) tablet, Take 1 tablet by mouth daily., Disp: , Rfl:  .  oxyCODONE (OXY IR/ROXICODONE) 5 MG immediate release tablet, Take 1 tablet (5 mg total) by mouth every 4 (four) hours as needed for moderate pain., Disp: 12 tablet, Rfl: 0 .  silver sulfADIAZINE (SILVADENE) 1 % cream, Apply 1 application topically daily. Thin layer to area twice daily, Disp: 50 g, Rfl: 11 .  Trolamine Salicylate (ASPERCREME EX), Apply topically as needed. , Disp: , Rfl:   Assessment/ Plan: 68 y.o. female   1. Depression, major, single episode, mild ([facility]) - escitalopram (LEXAPRO) 5 MG tablet; Take 1 tablet (5 mg total) by mouth daily.  Dispense: 30 tablet; Refill: 5  2. Malignant neoplasm of lower-outer quadrant of left breast of female, estrogen receptor positive ([place])  Continue with oncology   Start time: 8:35 AM End time: 8:44 AM  Meds ordered this encounter  Medications  . escitalopram (LEXAPRO) 5 MG tablet    Sig: Take 1 tablet (5 mg total) by mouth daily.    Dispense:  30 tablet    Refill:  5    Order Specific Question:   Supervising Provider    Answer:   Janora Norlander [[id number]]    Particia Nearing PA-C [place] [phone]

## 2019-01-14 ENCOUNTER — Telehealth: Payer: Self-pay | Admitting: Radiation Oncology

## 2019-01-14 NOTE — Telephone Encounter (Signed)
I spoke with the patient to make sure she was healing well. She was to see Dr. Donne Hazel two weeks ago but had to delay this due to a UTI. She is seeing him today and hopes she's well healed to move forward with XRT. Our soonest simulation appt is next Friday at 9 am, and I've given her this time. I've asked nursing to check in with her tomorrow to make sure she's been given clearance to proceed with XRT.

## 2019-01-15 ENCOUNTER — Telehealth: Payer: Self-pay | Admitting: *Deleted

## 2019-01-15 NOTE — Telephone Encounter (Signed)
Spoke with the patient to see how her appointment went with her surgeon.  She states that she has been cleared to start her radiation treatments.  We discussed the simulation date and time.  Information has been passed on to all necessary parties.  Will continue to follow as necessary.  Gloriajean Dell. Leonie Green, BSN

## 2019-01-19 ENCOUNTER — Encounter: Payer: Self-pay | Admitting: *Deleted

## 2019-01-22 ENCOUNTER — Other Ambulatory Visit: Payer: Self-pay

## 2019-01-22 ENCOUNTER — Ambulatory Visit
Admission: RE | Admit: 2019-01-22 | Discharge: 2019-01-22 | Disposition: A | Discharge status: Home / Self Care | Payer: BC Managed Care – PPO | Source: Ambulatory Visit | Attending: Radiation Oncology | Admitting: Radiation Oncology

## 2019-01-22 ENCOUNTER — Other Ambulatory Visit: Payer: Self-pay | Admitting: Physician Assistant

## 2019-01-22 DIAGNOSIS — C50512 Malignant neoplasm of lower-outer quadrant of left female breast: Secondary | ICD-10-CM | POA: Diagnosis not present

## 2019-01-22 DIAGNOSIS — F064 Anxiety disorder due to known physiological condition: Secondary | ICD-10-CM

## 2019-01-22 DIAGNOSIS — Z510 Encounter for antineoplastic radiation therapy: Secondary | ICD-10-CM | POA: Diagnosis not present

## 2019-01-22 DIAGNOSIS — Z170 Estrogen receptor positive status [ER+]: Secondary | ICD-10-CM | POA: Diagnosis not present

## 2019-01-22 MED ORDER — ALPRAZOLAM 0.25 MG PO TABS: 0.2500 mg | ORAL_TABLET | Freq: Two times a day (BID) | ORAL | 1 refills | Status: DC | PRN

## 2019-01-22 NOTE — Telephone Encounter (Signed)
AJ I denied the refill due to being controlled and then realized she had just seen you and why and wasn't sure if you could refill it without her having to be seen or not. Please advise

## 2019-01-24 NOTE — Progress Notes (Signed)
  Radiation Oncology         (336) [phone] ________________________________  Name: Anna Carney MRN: [id number]  Date: 01/22/2019  DOB: June 14, 1951  Optical Surface Tracking Plan:  Since intensity modulated radiotherapy (IMRT) and 3D conformal radiation treatment methods are predicated on accurate and precise positioning for treatment, intrafraction motion monitoring is medically necessary to ensure accurate and safe treatment delivery.  The ability to quantify intrafraction motion without excessive ionizing radiation dose can only be performed with optical surface tracking. Accordingly, surface imaging offers the opportunity to obtain 3D measurements of patient position throughout IMRT and 3D treatments without excessive radiation exposure.  I am ordering optical surface tracking for this patient's upcoming course of radiotherapy. ________________________________  Kyung Rudd, MD 01/24/2019 8:38 AM    Reference:   Particia Jasper, et al. Surface imaging-based analysis of intrafraction motion for breast radiotherapy patients.Journal of [place], n. 6, nov. 2014. ISSN [id number].   Available at: <http://www.jacmp.org/index.php/jacmp/article/view/4957>.

## 2019-01-24 NOTE — Progress Notes (Signed)
  Radiation Oncology         (336) [phone] ________________________________  Name: Anna Carney MRN: [id number]  Date: 01/22/2019  DOB: 1951/01/21  Diagnosis DIAGNOSIS:     ICD-10-CM   1. Malignant neoplasm of lower-outer quadrant of left breast of female, estrogen receptor positive ([place]) C50.512    Z17.0      SIMULATION AND TREATMENT PLANNING NOTE  The patient presented for simulation prior to beginning her course of radiation treatment for her diagnosis of left-sided breast cancer. The patient was placed in a supine position on a breast board. A customized vac-lock bag was also constructed and this complex treatment device will be used on a daily basis during her treatment. In this fashion, a CT scan was obtained through the chest area and an isocenter was placed near the chest wall at the upper aspect of the right chest. A breath-hold technique has also been evaluated to determine if this significantly improves the spatial relationship between the target region and the heart. Based on this analysis, a breath-hold technique has been ordered for the patient's treatment.  The patient will be planned to receive a course of radiation initially to a dose of 50.4 gray. This will consist of a 4 field technique targeting the left chest wall as well as the supraclavicular region. Therefore 2 customized medial and lateral tangent fields have been created targeting the chest wall, and also 2 additional customized fields have been designed to treat the supraclavicular region both with a left supraclavicular field and a left posterior axillary boost field. A forward planning/reduced field technique will also be evaluated to determine if this significantly improves the dose homogeneity of the overall plan. Therefore, additional customized blocks/fields may be necessary.  This initial treatment will be accomplished at 1.8 gray per fraction.   The initial plan will consist of a 3-D conformal technique. The  target volume/scar, heart and lungs have been contoured and dose volume histograms of each of these structures will be evaluated as part of the 3-D conformal treatment planning process.   It is anticipated that the patient will then receive a 10 gray boost to the surgical scar. This will be accomplished at 2 gray per fraction. The final anticipated total dose therefore will correspond to 60.4 gray.    Special treatment procedure was performed today due to the extra time and effort required by myself to plan and prepare this patient for deep inspiration breath hold technique.  I have determined cardiac sparing to be of benefit to this patient to prevent long term cardiac damage due to radiation of the heart.  Bellows were placed on the patient's abdomen. To facilitate cardiac sparing, the patient was coached by the radiation therapists on breath hold techniques and breathing practice was performed. Practice waveforms were obtained. The patient was then scanned while maintaining breath hold in the treatment position.  This image was then transferred over to the imaging specialist. The imaging specialist then created a fusion of the free breathing and breath hold scans using the chest wall as the stable structure. I personally reviewed the fusion in axial, coronal and sagittal image planes.  Excellent cardiac sparing was obtained.  I felt the patient is an appropriate candidate for breath hold and the patient will be treated as such.  The image fusion was then reviewed with the patient to reinforce the necessity of reproducible breath hold.     _______________________________   Jodelle Gross, MD, PhD

## 2019-01-27 DIAGNOSIS — C50512 Malignant neoplasm of lower-outer quadrant of left female breast: Secondary | ICD-10-CM | POA: Diagnosis not present

## 2019-01-29 ENCOUNTER — Other Ambulatory Visit: Payer: Self-pay

## 2019-01-29 ENCOUNTER — Ambulatory Visit
Admission: RE | Admit: 2019-01-29 | Discharge: 2019-01-29 | Disposition: A | Discharge status: Home / Self Care | Payer: BC Managed Care – PPO | Source: Ambulatory Visit | Attending: Radiation Oncology | Admitting: Radiation Oncology

## 2019-01-29 DIAGNOSIS — C50512 Malignant neoplasm of lower-outer quadrant of left female breast: Secondary | ICD-10-CM | POA: Diagnosis not present

## 2019-02-01 ENCOUNTER — Other Ambulatory Visit: Payer: Self-pay

## 2019-02-01 ENCOUNTER — Ambulatory Visit
Admission: RE | Admit: 2019-02-01 | Discharge: 2019-02-01 | Disposition: A | Discharge status: Home / Self Care | Payer: BC Managed Care – PPO | Source: Ambulatory Visit | Attending: Radiation Oncology | Admitting: Radiation Oncology

## 2019-02-01 DIAGNOSIS — C50512 Malignant neoplasm of lower-outer quadrant of left female breast: Secondary | ICD-10-CM | POA: Diagnosis not present

## 2019-02-02 ENCOUNTER — Other Ambulatory Visit: Payer: Self-pay

## 2019-02-02 ENCOUNTER — Ambulatory Visit
Admission: RE | Admit: 2019-02-02 | Discharge: 2019-02-02 | Disposition: A | Discharge status: Home / Self Care | Payer: BC Managed Care – PPO | Source: Ambulatory Visit | Attending: Radiation Oncology | Admitting: Radiation Oncology

## 2019-02-02 DIAGNOSIS — C50512 Malignant neoplasm of lower-outer quadrant of left female breast: Secondary | ICD-10-CM | POA: Diagnosis not present

## 2019-02-03 ENCOUNTER — Ambulatory Visit
Admission: RE | Admit: 2019-02-03 | Discharge: 2019-02-03 | Disposition: A | Discharge status: Home / Self Care | Payer: BC Managed Care – PPO | Source: Ambulatory Visit | Attending: Radiation Oncology | Admitting: Radiation Oncology

## 2019-02-03 ENCOUNTER — Other Ambulatory Visit: Payer: Self-pay

## 2019-02-03 DIAGNOSIS — C50512 Malignant neoplasm of lower-outer quadrant of left female breast: Secondary | ICD-10-CM | POA: Diagnosis not present

## 2019-02-04 ENCOUNTER — Other Ambulatory Visit: Payer: Self-pay

## 2019-02-04 ENCOUNTER — Ambulatory Visit
Admission: RE | Admit: 2019-02-04 | Discharge: 2019-02-04 | Disposition: A | Discharge status: Home / Self Care | Payer: BC Managed Care – PPO | Source: Ambulatory Visit | Attending: Radiation Oncology | Admitting: Radiation Oncology

## 2019-02-04 DIAGNOSIS — C50512 Malignant neoplasm of lower-outer quadrant of left female breast: Secondary | ICD-10-CM | POA: Diagnosis not present

## 2019-02-05 ENCOUNTER — Ambulatory Visit
Admission: RE | Admit: 2019-02-05 | Discharge: 2019-02-05 | Disposition: A | Discharge status: Home / Self Care | Payer: BC Managed Care – PPO | Source: Ambulatory Visit | Attending: Radiation Oncology | Admitting: Radiation Oncology

## 2019-02-05 ENCOUNTER — Other Ambulatory Visit: Payer: Self-pay

## 2019-02-05 DIAGNOSIS — C50512 Malignant neoplasm of lower-outer quadrant of left female breast: Secondary | ICD-10-CM | POA: Diagnosis not present

## 2019-02-08 ENCOUNTER — Ambulatory Visit
Admission: RE | Admit: 2019-02-08 | Discharge: 2019-02-08 | Disposition: A | Discharge status: Home / Self Care | Payer: BC Managed Care – PPO | Source: Ambulatory Visit | Attending: Radiation Oncology | Admitting: Radiation Oncology

## 2019-02-08 ENCOUNTER — Other Ambulatory Visit: Payer: Self-pay

## 2019-02-08 DIAGNOSIS — C50512 Malignant neoplasm of lower-outer quadrant of left female breast: Secondary | ICD-10-CM | POA: Diagnosis not present

## 2019-02-09 ENCOUNTER — Ambulatory Visit (HOSPITAL_COMMUNITY)
Admission: RE | Admit: 2019-02-09 | Discharge: 2019-02-09 | Disposition: A | Discharge status: Home / Self Care | Payer: BC Managed Care – PPO | Source: Ambulatory Visit | Attending: Hematology | Admitting: Hematology

## 2019-02-09 ENCOUNTER — Ambulatory Visit
Admission: RE | Admit: 2019-02-09 | Discharge: 2019-02-09 | Disposition: A | Discharge status: Home / Self Care | Payer: BC Managed Care – PPO | Source: Ambulatory Visit | Attending: Radiation Oncology | Admitting: Radiation Oncology

## 2019-02-09 ENCOUNTER — Inpatient Hospital Stay (HOSPITAL_COMMUNITY): Payer: BC Managed Care – PPO | Attending: Hematology

## 2019-02-09 ENCOUNTER — Other Ambulatory Visit: Payer: Self-pay

## 2019-02-09 DIAGNOSIS — M8580 Other specified disorders of bone density and structure, unspecified site: Secondary | ICD-10-CM | POA: Diagnosis not present

## 2019-02-09 DIAGNOSIS — C50512 Malignant neoplasm of lower-outer quadrant of left female breast: Secondary | ICD-10-CM | POA: Insufficient documentation

## 2019-02-09 DIAGNOSIS — Z170 Estrogen receptor positive status [ER+]: Secondary | ICD-10-CM | POA: Diagnosis present

## 2019-02-09 DIAGNOSIS — I951 Orthostatic hypotension: Secondary | ICD-10-CM | POA: Insufficient documentation

## 2019-02-09 DIAGNOSIS — Z79811 Long term (current) use of aromatase inhibitors: Secondary | ICD-10-CM | POA: Diagnosis not present

## 2019-02-09 LAB — CBC WITH DIFFERENTIAL/PLATELET
Abs Immature Granulocytes: 0.02 10*3/uL (ref 0.00–0.07)
Basophils Absolute: 0 10*3/uL (ref 0.0–0.1)
Basophils Relative: 1 %
Eosinophils Absolute: 0.2 10*3/uL (ref 0.0–0.5)
Eosinophils Relative: 4 %
HCT: 41.8 % (ref 36.0–46.0)
Hemoglobin: 13.5 g/dL (ref 12.0–15.0)
Immature Granulocytes: 0 %
Lymphocytes Relative: 23 %
Lymphs Abs: 1.2 10*3/uL (ref 0.7–4.0)
MCH: 30.4 pg (ref 26.0–34.0)
MCHC: 32.3 g/dL (ref 30.0–36.0)
MCV: 94.1 fL (ref 80.0–100.0)
Monocytes Absolute: 0.4 10*3/uL (ref 0.1–1.0)
Monocytes Relative: 7 %
Neutro Abs: 3.4 10*3/uL (ref 1.7–7.7)
Neutrophils Relative %: 65 %
Platelets: 166 10*3/uL (ref 150–400)
RBC: 4.44 MIL/uL (ref 3.87–5.11)
RDW: 13.6 % (ref 11.5–15.5)
WBC: 5.3 10*3/uL (ref 4.0–10.5)
nRBC: 0 % (ref 0.0–0.2)

## 2019-02-09 LAB — COMPREHENSIVE METABOLIC PANEL
ALT: 17 U/L (ref 0–44)
AST: 20 U/L (ref 15–41)
Albumin: 4 g/dL (ref 3.5–5.0)
Alkaline Phosphatase: 67 U/L (ref 38–126)
Anion gap: 10 (ref 5–15)
BUN: 19 mg/dL (ref 8–23)
CO2: 26 mmol/L (ref 22–32)
Calcium: 9 mg/dL (ref 8.9–10.3)
Chloride: 103 mmol/L (ref 98–111)
Creatinine, Ser: 0.74 mg/dL (ref 0.44–1.00)
GFR calc Af Amer: >60 mL/min (ref >60)
GFR calc non Af Amer: >60 mL/min (ref >60)
Glucose, Bld: 96 mg/dL (ref 70–99)
Potassium: 4 mmol/L (ref 3.5–5.1)
Sodium: 139 mmol/L (ref 135–145)
Total Bilirubin: 0.8 mg/dL (ref 0.3–1.2)
Total Protein: 7.4 g/dL (ref 6.5–8.1)

## 2019-02-10 ENCOUNTER — Other Ambulatory Visit: Payer: Self-pay

## 2019-02-10 ENCOUNTER — Ambulatory Visit
Admission: RE | Admit: 2019-02-10 | Discharge: 2019-02-10 | Disposition: A | Discharge status: Home / Self Care | Payer: BC Managed Care – PPO | Source: Ambulatory Visit | Attending: Radiation Oncology | Admitting: Radiation Oncology

## 2019-02-10 DIAGNOSIS — C50512 Malignant neoplasm of lower-outer quadrant of left female breast: Secondary | ICD-10-CM | POA: Diagnosis not present

## 2019-02-10 LAB — VITAMIN D 25 HYDROXY (VIT D DEFICIENCY, FRACTURES): Vit D, 25-Hydroxy: 39.2 ng/mL (ref 30.0–100.0)

## 2019-02-11 ENCOUNTER — Other Ambulatory Visit: Payer: Self-pay

## 2019-02-11 ENCOUNTER — Ambulatory Visit
Admission: RE | Admit: 2019-02-11 | Discharge: 2019-02-11 | Disposition: A | Discharge status: Home / Self Care | Payer: BC Managed Care – PPO | Source: Ambulatory Visit | Attending: Radiation Oncology | Admitting: Radiation Oncology

## 2019-02-11 DIAGNOSIS — C50512 Malignant neoplasm of lower-outer quadrant of left female breast: Secondary | ICD-10-CM | POA: Diagnosis not present

## 2019-02-12 ENCOUNTER — Ambulatory Visit
Admission: RE | Admit: 2019-02-12 | Discharge: 2019-02-12 | Disposition: A | Discharge status: Home / Self Care | Payer: BC Managed Care – PPO | Source: Ambulatory Visit | Attending: Radiation Oncology | Admitting: Radiation Oncology

## 2019-02-12 ENCOUNTER — Other Ambulatory Visit: Payer: Self-pay

## 2019-02-12 DIAGNOSIS — C50512 Malignant neoplasm of lower-outer quadrant of left female breast: Secondary | ICD-10-CM

## 2019-02-12 MED ORDER — ALRA NON-METALLIC DEODORANT (RAD-ONC)
1.0000 "application " | Freq: Once | TOPICAL | Status: AC
  Administered 2019-02-12: 1 via TOPICAL

## 2019-02-15 ENCOUNTER — Other Ambulatory Visit: Payer: Self-pay

## 2019-02-15 ENCOUNTER — Ambulatory Visit
Admission: RE | Admit: 2019-02-15 | Discharge: 2019-02-15 | Disposition: A | Discharge status: Home / Self Care | Payer: BC Managed Care – PPO | Source: Ambulatory Visit | Attending: Radiation Oncology | Admitting: Radiation Oncology

## 2019-02-15 DIAGNOSIS — C50512 Malignant neoplasm of lower-outer quadrant of left female breast: Secondary | ICD-10-CM | POA: Diagnosis not present

## 2019-02-16 ENCOUNTER — Inpatient Hospital Stay (HOSPITAL_BASED_OUTPATIENT_CLINIC_OR_DEPARTMENT_OTHER): Payer: BC Managed Care – PPO | Admitting: Hematology

## 2019-02-16 ENCOUNTER — Encounter (HOSPITAL_COMMUNITY): Payer: Self-pay | Admitting: Hematology

## 2019-02-16 ENCOUNTER — Ambulatory Visit
Admission: RE | Admit: 2019-02-16 | Discharge: 2019-02-16 | Disposition: A | Discharge status: Home / Self Care | Payer: BC Managed Care – PPO | Source: Ambulatory Visit | Attending: Radiation Oncology | Admitting: Radiation Oncology

## 2019-02-16 ENCOUNTER — Other Ambulatory Visit: Payer: Self-pay

## 2019-02-16 VITALS — BP 136/67 | HR 74 | Temp 98.4°F | Resp 18 | Wt 204.5 lb

## 2019-02-16 DIAGNOSIS — Z170 Estrogen receptor positive status [ER+]: Secondary | ICD-10-CM

## 2019-02-16 DIAGNOSIS — C50512 Malignant neoplasm of lower-outer quadrant of left female breast: Secondary | ICD-10-CM | POA: Diagnosis not present

## 2019-02-16 DIAGNOSIS — Z79811 Long term (current) use of aromatase inhibitors: Secondary | ICD-10-CM | POA: Diagnosis not present

## 2019-02-16 DIAGNOSIS — M8580 Other specified disorders of bone density and structure, unspecified site: Secondary | ICD-10-CM | POA: Diagnosis not present

## 2019-02-16 DIAGNOSIS — R232 Flushing: Secondary | ICD-10-CM

## 2019-02-16 DIAGNOSIS — I951 Orthostatic hypotension: Secondary | ICD-10-CM

## 2019-02-16 MED ORDER — GABAPENTIN 300 MG PO CAPS: 300.0000 mg | ORAL_CAPSULE | Freq: Every day | ORAL | 3 refills | Status: DC

## 2019-02-16 NOTE — Patient Instructions (Addendum)
[facility] at [facility] Discharge Instructions  You were seen today by Dr. Delton Coombes. He went over your recent lab results. He discussed the Prolia injection with you, and some of it's side effects. He will see you back in 3 months for labs and follow up.   Thank you for choosing [place] at [facility] to provide your oncology and hematology care.  To afford each patient quality time with our provider, please arrive at least 15 minutes before your scheduled appointment time.   If you have a lab appointment with the [facility] please come in thru the  Main Entrance and check in at the main information desk  You need to re-schedule your appointment should you arrive 10 or more minutes late.  We strive to give you quality time with our providers, and arriving late affects you and other patients whose appointments are after yours.  Also, if you no show three or more times for appointments you may be dismissed from the clinic at the providers discretion.     Again, thank you for choosing [facility].  Our hope is that these requests will decrease the amount of time that you wait before being seen by our physicians.       _____________________________________________________________  Should you have questions after your visit to [facility], please contact our office at [phone] between the hours of 8:00 a.m. and 4:30 p.m.  Voicemails left after 4:00 p.m. will not be returned until the following business day.  For prescription refill requests, have your pharmacy contact our office and allow 72 hours.    Cancer Center Support Programs:   > Cancer Support Group  2nd Tuesday of the month 1pm-2pm, Journey Room

## 2019-02-16 NOTE — Assessment & Plan Note (Signed)
1.  Stage IIIb (T4N0) left breast IDC, ER/PR positive, HER-2 negative: - Patient with known breast mass for the past 3 years, neglected it. -Biopsy of the left breast on 10/20/2018 showed IDC.  Left axillary core biopsy was negative. -Status post left mastectomy on 11/12/2018, 11 cm IDC, grade 2, margins negative, 0/14 lymph node positive, ER/PR positive, HER-2 2+ by IHC, negative by FISH, Ki-67 10%. -Oncotype DX recurrence score of 2. - Because of low Oncotype DX recurrence, I did not recommend any chemotherapy. - She was started on anastrozole on 12/10/2018. -She is complaining of lot of hot flashes mostly at nighttime.  He is unable to sleep.  She has knee pains which are stable. -She was started on radiation therapy on 02/01/2019.  She will apparently finish it on 03/18/2019. -I will see her back in 3 months for follow-up.  We will plan to repeat blood work at that time.  2.  Hot flashes: -These are predominantly at nighttime.  She is unable to sleep. -I will start her on gabapentin 300 mg at bedtime and titrate the dose as tolerated.  This will also likely help her sleep.  3.  Osteopenia: -We talked about DEXA scan results dated 02/09/2019.  T score is -2. - Vitamin D level is 39.2.  She will continue calcium and vitamin D twice daily.  She is apparently having constipation since the start of calcium.  She was told to take stool softeners 2 tablets daily. - I talked about starting her on Prolia every 6 months throughout the duration of aromatase inhibitor therapy.  We discussed the side effects in detail.  She will have a cavity filled after she finishes radiation. -I plan to start her on next visit in 3 months.

## 2019-02-16 NOTE — Progress Notes (Signed)
[place], [place] [address]   CLINIC:  Medical Oncology/Hematology  PCP:  Terald Sleeper, PA-C [place] [id number] [phone]   REASON FOR VISIT:  Follow-up for breast cancer     INTERVAL HISTORY:  Ms. Stahle 68 y.o. female seen for follow-up of breast cancer.  She started radiation therapy on 02/01/2019.  She started Arimidex in April ending.  She complained of hot flashes predominantly at nighttime.  They are keeping her awake at night.  Knee pains are stable.  No musculoskeletal symptoms.  She is taking calcium vitamin D twice daily.  However she is experienced constipation.  She had bone density test done recently.  Appetite is 50%.  Energy levels are 75%.    REVIEW OF SYSTEMS:  Review of Systems  Gastrointestinal: Positive for constipation.  Musculoskeletal: Positive for arthralgias.  Psychiatric/Behavioral: Positive for sleep disturbance.  All other systems reviewed and are negative.    PAST MEDICAL/SURGICAL HISTORY:  Past Medical History:  Diagnosis Date  . Anxiety   . Arthritis   . Cancer ([place]) 10/2018   left breast IDC  . Shingles    Past Surgical History:  Procedure Laterality Date  . CHOLECYSTECTOMY    . MASTECTOMY W/ SENTINEL NODE BIOPSY Left 11/12/2018   Procedure: LEFT MASTECTOMY WITH LEFT AXILLARY SENTINEL LYMPH NODE BIOPSY;  Surgeon: Rolm Bookbinder, MD;  Location: [place];  Service: General;  Laterality: Left;     SOCIAL HISTORY:  Social History   Socioeconomic History  . Marital status: Single    Spouse name: Not on file  . Number of children: Not on file  . Years of education: Not on file  . Highest education level: Not on file  Occupational History  . Occupation: [employer]    Comment: in garden center  . 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 Financial resource strain: Not hard at all  . Food  insecurity    Worry: Never true    Inability: Never true  . Transportation needs    Medical: No    Non-medical: No  Tobacco Use  . Smoking status: Former Research scientist (life sciences)  . Smokeless tobacco: Never Used  Substance and Sexual Activity  . Alcohol use: Yes    Comment: rarely  . Drug use: No  . Sexual activity: Not Currently    Birth control/protection: Post-menopausal  Lifestyle  . Physical activity    Days per week: Not on file    Minutes per session: Not on file  . Stress: Rather much  Relationships  . Social Herbalist on phone: Not on file    Gets together: Not on file    Attends religious service: Not on file    Active member of club or organization: Not on file    Attends meetings of clubs or organizations: Not on file    Relationship status: Not on file  . Intimate partner violence    Fear of current or ex partner: Not on file    Emotionally abused: Not on file    Physically abused: Not on file    Forced sexual activity: Not on file  Other Topics Concern  . Not on file  Social History Narrative  . Not on file    FAMILY HISTORY:  Family History  Problem Relation Age of Onset  . Arthritis Mother   . Diabetes Mother   .  Stroke Mother   . Asthma Father   . Colon cancer Brother   . Arthritis Daughter   . Thyroid disease Daughter   . Breast cancer Maternal Aunt     CURRENT MEDICATIONS:  Outpatient Encounter Medications as of 02/16/2019  Medication Sig  . ALPRAZolam (XANAX) 0.25 MG tablet Take 1 tablet (0.25 mg total) by mouth 2 (two) times daily as needed for anxiety.  [name] anastrozole (ARIMIDEX) 1 MG tablet Take 1 tablet (1 mg total) by mouth daily.  . Ascorbic Acid (VITAMIN C) 1000 MG tablet Take 2,000 mg by mouth daily.  . calcium carbonate (OS-CAL) 1250 (500 Ca) MG chewable tablet Chew 1 tablet by mouth daily.  . cholecalciferol (VITAMIN D3) 25 MCG (1000 UT) tablet Take 2,000 Units by mouth daily.  [name] escitalopram (LEXAPRO) 5 MG tablet Take 1 tablet (5 mg  total) by mouth daily.  [name] ibuprofen (ADVIL) 200 MG tablet Take 200 mg by mouth as needed.  . Multiple Vitamin (MULTIVITAMIN) tablet Take 1 tablet by mouth daily.  [name] acetaminophen (TYLENOL) 325 MG tablet Take 650 mg by mouth every 6 (six) hours as needed.  . diclofenac sodium (VOLTAREN) 1 % GEL Apply topically.  . gabapentin (NEURONTIN) 300 MG capsule Take 1 capsule (300 mg total) by mouth at bedtime.  . methocarbamol (ROBAXIN) 500 MG tablet Take by mouth.  . oxyCODONE (OXY IR/ROXICODONE) 5 MG immediate release tablet Take 1 tablet (5 mg total) by mouth every 4 (four) hours as needed for moderate pain. (Patient not taking: Reported on 02/16/2019)  . silver sulfADIAZINE (SILVADENE) 1 % cream Apply 1 application topically daily. Thin layer to area twice daily (Patient not taking: Reported on 02/16/2019)  . Trolamine Salicylate (ASPERCREME EX) Apply topically as needed.    No facility-administered encounter medications on file as of 02/16/2019.     ALLERGIES:  Allergies  Allergen Reactions  . Sulfa Antibiotics Nausea Only     PHYSICAL EXAM:  ECOG Performance status: 1  Vitals:   02/16/19 1107  BP: 136/67  Pulse: 74  Resp: 18  Temp: 98.4 F (36.9 C)  SpO2: 98%   Filed Weights   02/16/19 1107  Weight: 204 lb 8 oz (92.8 kg)    Physical Exam Vitals signs reviewed.  Constitutional:      Appearance: Normal appearance.  Cardiovascular:     Rate and Rhythm: Normal rate and regular rhythm.     Heart sounds: Normal heart sounds.  Pulmonary:     Breath sounds: Normal breath sounds.  Abdominal:     General: There is no distension.     Palpations: Abdomen is soft. There is no mass.  Musculoskeletal:        General: No swelling.  Skin:    General: Skin is warm.  Neurological:     Mental Status: She is alert and oriented to person, place, and time.  Psychiatric:        Mood and Affect: Mood normal.        Behavior: Behavior normal.    Breast exam was not done today.   LABORATORY DATA:  I have reviewed the labs as listed.  CBC    Component Value Date/Time   WBC 5.3 02/09/2019 1006   RBC 4.44 02/09/2019 1006   HGB 13.5 02/09/2019 1006   HGB 13.8 10/21/2018 1657   HCT 41.8 02/09/2019 1006   HCT 40.1 10/21/2018 1657   PLT 166 02/09/2019 1006   PLT 245 10/21/2018 1657   MCV 94.1 02/09/2019 1006  MCV 90 10/21/2018 1657   MCH 30.4 02/09/2019 1006   MCHC 32.3 02/09/2019 1006   RDW 13.6 02/09/2019 1006   RDW 11.8 10/21/2018 1657   LYMPHSABS 1.2 02/09/2019 1006   LYMPHSABS 1.6 10/21/2018 1657   MONOABS 0.4 02/09/2019 1006   EOSABS 0.2 02/09/2019 1006   EOSABS 0.2 10/21/2018 1657   BASOSABS 0.0 02/09/2019 1006   BASOSABS 0.1 10/21/2018 1657   CMP Latest Ref Rng & Units 02/09/2019 10/21/2018  Glucose 70 - 99 mg/dL 96 105(H)  BUN 8 - 23 mg/dL 19 14  Creatinine 0.44 - 1.00 mg/dL 0.74 0.65  Sodium 135 - 145 mmol/L 139 144  Potassium 3.5 - 5.1 mmol/L 4.0 3.9  Chloride 98 - 111 mmol/L 103 106  CO2 22 - 32 mmol/L 26 23  Calcium 8.9 - 10.3 mg/dL 9.0 9.3  Total Protein 6.5 - 8.1 g/dL 7.4 6.6  Total Bilirubin 0.3 - 1.2 mg/dL 0.8 0.4  Alkaline Phos 38 - 126 U/L 67 80  AST 15 - 41 U/L 20 21  ALT 0 - 44 U/L 17 14       DIAGNOSTIC IMAGING:  I have independently reviewed the scans and discussed with the patient.   I have reviewed Venita Lick LPN's note and agree with the documentation.  I personally performed a face-to-face visit, made revisions and my assessment and plan is as follows.    ASSESSMENT & PLAN:   Malignant neoplasm of lower-outer quadrant of left breast of female, estrogen receptor positive (HCC) 1.  Stage IIIb (T4N0) left breast IDC, ER/PR positive, HER-2 negative: - Patient with known breast mass for the past 3 years, neglected it. -Biopsy of the left breast on 10/20/2018 showed IDC.  Left axillary core biopsy was negative. -Status post left mastectomy on 11/12/2018, 11 cm IDC, grade 2, margins negative, 0/14 lymph node positive,  ER/PR positive, HER-2 2+ by IHC, negative by FISH, Ki-67 10%. -Oncotype DX recurrence score of 2. - Because of low Oncotype DX recurrence, I did not recommend any chemotherapy. - She was started on anastrozole on 12/10/2018. -She is complaining of lot of hot flashes mostly at nighttime.  He is unable to sleep.  She has knee pains which are stable. -She was started on radiation therapy on 02/01/2019.  She will apparently finish it on 03/18/2019. -I will see her back in 3 months for follow-up.  We will plan to repeat blood work at that time.  2.  Hot flashes: -These are predominantly at nighttime.  She is unable to sleep. -I will start her on gabapentin 300 mg at bedtime and titrate the dose as tolerated.  This will also likely help her sleep.  3.  Osteopenia: -We talked about DEXA scan results dated 02/09/2019.  T score is -2. - Vitamin D level is 39.2.  She will continue calcium and vitamin D twice daily.  She is apparently having constipation since the start of calcium.  She was told to take stool softeners 2 tablets daily. - I talked about starting her on Prolia every 6 months throughout the duration of aromatase inhibitor therapy.  We discussed the side effects in detail.  She will have a cavity filled after she finishes radiation. -I plan to start her on next visit in 3 months.   Total time spent is 25 minutes with more than 50% of the time spent face-to-face discussing side effect management, counseling and coordination of care.  Orders placed this encounter:  Orders Placed This Encounter  Procedures  .  CBC with Differential/Platelet  . Comprehensive metabolic panel      Derek Jack, MD [place] [phone]

## 2019-02-17 ENCOUNTER — Ambulatory Visit
Admission: RE | Admit: 2019-02-17 | Discharge: 2019-02-17 | Disposition: A | Discharge status: Home / Self Care | Payer: BC Managed Care – PPO | Source: Ambulatory Visit | Attending: Radiation Oncology | Admitting: Radiation Oncology

## 2019-02-17 ENCOUNTER — Other Ambulatory Visit: Payer: Self-pay

## 2019-02-17 DIAGNOSIS — Z170 Estrogen receptor positive status [ER+]: Secondary | ICD-10-CM | POA: Diagnosis not present

## 2019-02-17 DIAGNOSIS — C50512 Malignant neoplasm of lower-outer quadrant of left female breast: Secondary | ICD-10-CM | POA: Diagnosis not present

## 2019-02-17 DIAGNOSIS — Z510 Encounter for antineoplastic radiation therapy: Secondary | ICD-10-CM | POA: Insufficient documentation

## 2019-02-17 MED ORDER — RADIAPLEXRX EX GEL
Freq: Once | CUTANEOUS | Status: AC
  Administered 2019-02-17: 09:00:00 via TOPICAL

## 2019-02-18 ENCOUNTER — Ambulatory Visit
Admission: RE | Admit: 2019-02-18 | Discharge: 2019-02-18 | Disposition: A | Discharge status: Home / Self Care | Payer: BC Managed Care – PPO | Source: Ambulatory Visit | Attending: Radiation Oncology | Admitting: Radiation Oncology

## 2019-02-18 ENCOUNTER — Other Ambulatory Visit: Payer: Self-pay

## 2019-02-18 DIAGNOSIS — C50512 Malignant neoplasm of lower-outer quadrant of left female breast: Secondary | ICD-10-CM | POA: Diagnosis not present

## 2019-02-22 ENCOUNTER — Other Ambulatory Visit: Payer: Self-pay

## 2019-02-22 ENCOUNTER — Ambulatory Visit
Admission: RE | Admit: 2019-02-22 | Discharge: 2019-02-22 | Disposition: A | Discharge status: Home / Self Care | Payer: BC Managed Care – PPO | Source: Ambulatory Visit | Attending: Radiation Oncology | Admitting: Radiation Oncology

## 2019-02-22 DIAGNOSIS — C50512 Malignant neoplasm of lower-outer quadrant of left female breast: Secondary | ICD-10-CM | POA: Diagnosis not present

## 2019-02-23 ENCOUNTER — Ambulatory Visit
Admission: RE | Admit: 2019-02-23 | Discharge: 2019-02-23 | Disposition: A | Discharge status: Home / Self Care | Payer: BC Managed Care – PPO | Source: Ambulatory Visit | Attending: Radiation Oncology | Admitting: Radiation Oncology

## 2019-02-23 ENCOUNTER — Other Ambulatory Visit: Payer: Self-pay

## 2019-02-23 DIAGNOSIS — C50512 Malignant neoplasm of lower-outer quadrant of left female breast: Secondary | ICD-10-CM | POA: Diagnosis not present

## 2019-02-24 ENCOUNTER — Ambulatory Visit
Admission: RE | Admit: 2019-02-24 | Discharge: 2019-02-24 | Disposition: A | Discharge status: Home / Self Care | Payer: BC Managed Care – PPO | Source: Ambulatory Visit | Attending: Radiation Oncology | Admitting: Radiation Oncology

## 2019-02-24 ENCOUNTER — Other Ambulatory Visit: Payer: Self-pay

## 2019-02-24 DIAGNOSIS — C50512 Malignant neoplasm of lower-outer quadrant of left female breast: Secondary | ICD-10-CM | POA: Diagnosis not present

## 2019-02-25 ENCOUNTER — Other Ambulatory Visit: Payer: Self-pay

## 2019-02-25 ENCOUNTER — Ambulatory Visit
Admission: RE | Admit: 2019-02-25 | Discharge: 2019-02-25 | Disposition: A | Discharge status: Home / Self Care | Payer: BC Managed Care – PPO | Source: Ambulatory Visit | Attending: Radiation Oncology | Admitting: Radiation Oncology

## 2019-02-25 DIAGNOSIS — C50512 Malignant neoplasm of lower-outer quadrant of left female breast: Secondary | ICD-10-CM | POA: Diagnosis not present

## 2019-02-26 ENCOUNTER — Other Ambulatory Visit: Payer: Self-pay

## 2019-02-26 ENCOUNTER — Ambulatory Visit
Admission: RE | Admit: 2019-02-26 | Discharge: 2019-02-26 | Disposition: A | Discharge status: Home / Self Care | Payer: BC Managed Care – PPO | Source: Ambulatory Visit | Attending: Radiation Oncology | Admitting: Radiation Oncology

## 2019-02-26 DIAGNOSIS — C50512 Malignant neoplasm of lower-outer quadrant of left female breast: Secondary | ICD-10-CM | POA: Diagnosis not present

## 2019-03-01 ENCOUNTER — Ambulatory Visit
Admission: RE | Admit: 2019-03-01 | Discharge: 2019-03-01 | Disposition: A | Discharge status: Home / Self Care | Payer: BC Managed Care – PPO | Source: Ambulatory Visit | Attending: Radiation Oncology | Admitting: Radiation Oncology

## 2019-03-01 ENCOUNTER — Other Ambulatory Visit: Payer: Self-pay

## 2019-03-01 DIAGNOSIS — C50512 Malignant neoplasm of lower-outer quadrant of left female breast: Secondary | ICD-10-CM | POA: Diagnosis not present

## 2019-03-02 ENCOUNTER — Other Ambulatory Visit: Payer: Self-pay

## 2019-03-02 ENCOUNTER — Ambulatory Visit
Admission: RE | Admit: 2019-03-02 | Discharge: 2019-03-02 | Disposition: A | Discharge status: Home / Self Care | Payer: BC Managed Care – PPO | Source: Ambulatory Visit | Attending: Radiation Oncology | Admitting: Radiation Oncology

## 2019-03-02 DIAGNOSIS — C50512 Malignant neoplasm of lower-outer quadrant of left female breast: Secondary | ICD-10-CM | POA: Diagnosis not present

## 2019-03-03 ENCOUNTER — Other Ambulatory Visit: Payer: Self-pay

## 2019-03-03 ENCOUNTER — Ambulatory Visit
Admission: RE | Admit: 2019-03-03 | Discharge: 2019-03-03 | Disposition: A | Discharge status: Home / Self Care | Payer: BC Managed Care – PPO | Source: Ambulatory Visit | Attending: Radiation Oncology | Admitting: Radiation Oncology

## 2019-03-03 DIAGNOSIS — C50512 Malignant neoplasm of lower-outer quadrant of left female breast: Secondary | ICD-10-CM | POA: Diagnosis not present

## 2019-03-04 ENCOUNTER — Ambulatory Visit
Admission: RE | Admit: 2019-03-04 | Discharge: 2019-03-04 | Disposition: A | Discharge status: Home / Self Care | Payer: BC Managed Care – PPO | Source: Ambulatory Visit | Attending: Radiation Oncology | Admitting: Radiation Oncology

## 2019-03-04 ENCOUNTER — Other Ambulatory Visit: Payer: Self-pay

## 2019-03-04 DIAGNOSIS — C50512 Malignant neoplasm of lower-outer quadrant of left female breast: Secondary | ICD-10-CM | POA: Diagnosis not present

## 2019-03-05 ENCOUNTER — Other Ambulatory Visit: Payer: Self-pay

## 2019-03-05 ENCOUNTER — Ambulatory Visit
Admission: RE | Admit: 2019-03-05 | Discharge: 2019-03-05 | Disposition: A | Discharge status: Home / Self Care | Payer: BC Managed Care – PPO | Source: Ambulatory Visit | Attending: Radiation Oncology | Admitting: Radiation Oncology

## 2019-03-05 DIAGNOSIS — C50512 Malignant neoplasm of lower-outer quadrant of left female breast: Secondary | ICD-10-CM

## 2019-03-05 MED ORDER — RADIAPLEXRX EX GEL
Freq: Once | CUTANEOUS | Status: AC
  Administered 2019-03-05: 09:00:00 via TOPICAL

## 2019-03-08 ENCOUNTER — Ambulatory Visit
Admission: RE | Admit: 2019-03-08 | Discharge: 2019-03-08 | Disposition: A | Discharge status: Home / Self Care | Payer: BC Managed Care – PPO | Source: Ambulatory Visit | Attending: Radiation Oncology | Admitting: Radiation Oncology

## 2019-03-08 ENCOUNTER — Other Ambulatory Visit: Payer: Self-pay

## 2019-03-08 DIAGNOSIS — C50512 Malignant neoplasm of lower-outer quadrant of left female breast: Secondary | ICD-10-CM | POA: Diagnosis not present

## 2019-03-09 ENCOUNTER — Ambulatory Visit
Admission: RE | Admit: 2019-03-09 | Discharge: 2019-03-09 | Disposition: A | Discharge status: Home / Self Care | Payer: BC Managed Care – PPO | Source: Ambulatory Visit | Attending: Radiation Oncology | Admitting: Radiation Oncology

## 2019-03-09 ENCOUNTER — Other Ambulatory Visit: Payer: Self-pay

## 2019-03-09 DIAGNOSIS — C50512 Malignant neoplasm of lower-outer quadrant of left female breast: Secondary | ICD-10-CM | POA: Diagnosis not present

## 2019-03-10 ENCOUNTER — Ambulatory Visit
Admission: RE | Admit: 2019-03-10 | Discharge: 2019-03-10 | Disposition: A | Discharge status: Home / Self Care | Payer: BC Managed Care – PPO | Source: Ambulatory Visit | Attending: Radiation Oncology | Admitting: Radiation Oncology

## 2019-03-10 ENCOUNTER — Other Ambulatory Visit: Payer: Self-pay

## 2019-03-10 DIAGNOSIS — C50512 Malignant neoplasm of lower-outer quadrant of left female breast: Secondary | ICD-10-CM | POA: Diagnosis not present

## 2019-03-11 ENCOUNTER — Ambulatory Visit
Admission: RE | Admit: 2019-03-11 | Discharge: 2019-03-11 | Disposition: A | Discharge status: Home / Self Care | Payer: BC Managed Care – PPO | Source: Ambulatory Visit | Attending: Radiation Oncology | Admitting: Radiation Oncology

## 2019-03-11 ENCOUNTER — Other Ambulatory Visit: Payer: Self-pay

## 2019-03-11 DIAGNOSIS — C50512 Malignant neoplasm of lower-outer quadrant of left female breast: Secondary | ICD-10-CM | POA: Diagnosis not present

## 2019-03-12 ENCOUNTER — Ambulatory Visit
Admission: RE | Admit: 2019-03-12 | Discharge: 2019-03-12 | Disposition: A | Discharge status: Home / Self Care | Payer: BC Managed Care – PPO | Source: Ambulatory Visit | Attending: Radiation Oncology | Admitting: Radiation Oncology

## 2019-03-12 ENCOUNTER — Other Ambulatory Visit: Payer: Self-pay

## 2019-03-12 DIAGNOSIS — C50512 Malignant neoplasm of lower-outer quadrant of left female breast: Secondary | ICD-10-CM | POA: Diagnosis not present

## 2019-03-15 ENCOUNTER — Ambulatory Visit
Admission: RE | Admit: 2019-03-15 | Discharge: 2019-03-15 | Disposition: A | Discharge status: Home / Self Care | Payer: BC Managed Care – PPO | Source: Ambulatory Visit | Attending: Radiation Oncology | Admitting: Radiation Oncology

## 2019-03-15 ENCOUNTER — Other Ambulatory Visit: Payer: Self-pay

## 2019-03-15 DIAGNOSIS — C50512 Malignant neoplasm of lower-outer quadrant of left female breast: Secondary | ICD-10-CM | POA: Diagnosis not present

## 2019-03-16 ENCOUNTER — Ambulatory Visit
Admission: RE | Admit: 2019-03-16 | Discharge: 2019-03-16 | Disposition: A | Discharge status: Home / Self Care | Payer: BC Managed Care – PPO | Source: Ambulatory Visit | Attending: Radiation Oncology | Admitting: Radiation Oncology

## 2019-03-16 ENCOUNTER — Other Ambulatory Visit: Payer: Self-pay

## 2019-03-16 DIAGNOSIS — C50512 Malignant neoplasm of lower-outer quadrant of left female breast: Secondary | ICD-10-CM | POA: Diagnosis not present

## 2019-03-17 ENCOUNTER — Ambulatory Visit
Admission: RE | Admit: 2019-03-17 | Discharge: 2019-03-17 | Disposition: A | Discharge status: Home / Self Care | Payer: BC Managed Care – PPO | Source: Ambulatory Visit | Attending: Radiation Oncology | Admitting: Radiation Oncology

## 2019-03-17 ENCOUNTER — Other Ambulatory Visit: Payer: Self-pay

## 2019-03-17 DIAGNOSIS — C50512 Malignant neoplasm of lower-outer quadrant of left female breast: Secondary | ICD-10-CM | POA: Diagnosis not present

## 2019-03-18 ENCOUNTER — Other Ambulatory Visit: Payer: Self-pay

## 2019-03-18 ENCOUNTER — Ambulatory Visit
Admission: RE | Admit: 2019-03-18 | Discharge: 2019-03-18 | Disposition: A | Discharge status: Home / Self Care | Payer: BC Managed Care – PPO | Source: Ambulatory Visit | Attending: Radiation Oncology | Admitting: Radiation Oncology

## 2019-03-18 DIAGNOSIS — C50512 Malignant neoplasm of lower-outer quadrant of left female breast: Secondary | ICD-10-CM | POA: Diagnosis not present

## 2019-04-11 ENCOUNTER — Telehealth: Payer: Self-pay | Admitting: Radiation Oncology

## 2019-04-11 NOTE — Telephone Encounter (Signed)
  Radiation Oncology         [phone] ________________________________  Name: Anna Carney MRN: [id number]  Date of Service: 04/11/2019  DOB: 20-Sep-1950  Post Treatment Telephone Note  Diagnosis:  Stage IIIA, pT4bN0M0, grade 2 ER/PR positive invasive ductal carcinoma of the left breast  Interval Since Last Radiation:  4 weeks   02/01/2019-03/18/2019: The left chest wall and regional nodes were treated in 28 fractions to 50.4 Gy, followed by a boost to 10 Gy in 5 fractions, totaling to 60 Gy in 33 fractions  Narrative:  The patient was contacted today for routine follow-up. During treatment she did very well with radiotherapy and did not have significant desquamation.   Impression/Plan: 1. 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 I had to leave a message asking her to call me back to discuss skin care considerations. She is already scheduled to follow up with Dr. Delton Coombes next week as well.      Carola Rhine, PAC

## 2019-04-16 ENCOUNTER — Telehealth: Payer: Self-pay | Admitting: Physician Assistant

## 2019-04-16 ENCOUNTER — Encounter: Payer: Self-pay | Admitting: Physician Assistant

## 2019-04-16 ENCOUNTER — Ambulatory Visit (INDEPENDENT_AMBULATORY_CARE_PROVIDER_SITE_OTHER): Payer: BC Managed Care – PPO | Admitting: Physician Assistant

## 2019-04-16 DIAGNOSIS — R609 Edema, unspecified: Secondary | ICD-10-CM

## 2019-04-18 NOTE — Progress Notes (Signed)
Telephone visit  Subjective: [id number] PCP: Terald Sleeper, PA-C [phone] Anna Carney is a 68 y.o. female calls for telephone consult today. Patient provides verbal consent for consult held via phone.  Patient is identified with 2 separate identifiers.  At this time the entire area is on COVID-19 social distancing and stay home orders are in place.  Patient is of higher risk and therefore we are performing this by a virtual method.  Location of patient: home Location of provider: [facility] Others present for call: no  This patient has had increased feeling on the left side.  This is new.  She is back at work full-time.  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  The pain is in the left foot.  She denies any other injury, no bumps or lesions.  She has not been wearing any type of compression stockings.  I encouraged her to try to get a pair to wear once her feet go down.  Does this can become a chronic issue.  Also she does have some hydrochlorothiazide 25 mg have encouraged her to take 1 daily over the next week and then she can possibly go to every other day.   ROS: Per HPI  Allergies  Allergen Reactions  . Sulfa Antibiotics Nausea Only   Past Medical History:  Diagnosis Date  . Anxiety   . Arthritis   . Cancer ([place]) 10/2018   left breast IDC  . Shingles     Current Outpatient Medications:  .  acetaminophen (TYLENOL) 325 MG tablet, Take 650 mg by mouth every 6 (six) hours as needed., Disp: , Rfl:  .  ALPRAZolam (XANAX) 0.25 MG tablet, Take 1 tablet (0.25 mg total) by mouth 2 (two) times daily as needed for anxiety., Disp: 20 tablet, Rfl: 1 .  anastrozole (ARIMIDEX) 1 MG tablet, Take 1 tablet (1 mg total) by mouth daily., Disp: 30 tablet, Rfl: 6 .  Ascorbic Acid (VITAMIN C) 1000 MG tablet, Take 2,000 mg by mouth daily., Disp: , Rfl:  .  calcium carbonate (OS-CAL) 1250 (500 Ca) MG chewable tablet, Chew 1 tablet by mouth daily., Disp: ,  Rfl:  .  cholecalciferol (VITAMIN D3) 25 MCG (1000 UT) tablet, Take 2,000 Units by mouth daily., Disp: , Rfl:  .  diclofenac sodium (VOLTAREN) 1 % GEL, Apply topically., Disp: , Rfl:  .  escitalopram (LEXAPRO) 5 MG tablet, Take 1 tablet (5 mg total) by mouth daily., Disp: 30 tablet, Rfl: 5 .  gabapentin (NEURONTIN) 300 MG capsule, Take 1 capsule (300 mg total) by mouth at bedtime., Disp: 30 capsule, Rfl: 3 .  ibuprofen (ADVIL) 200 MG tablet, Take 200 mg by mouth as needed., Disp: , Rfl:  .  methocarbamol (ROBAXIN) 500 MG tablet, Take by mouth., Disp: , Rfl:  .  Multiple Vitamin (MULTIVITAMIN) tablet, Take 1 tablet by mouth daily., Disp: , Rfl:  .  oxyCODONE (OXY IR/ROXICODONE) 5 MG immediate release tablet, Take 1 tablet (5 mg total) by mouth every 4 (four) hours as needed for moderate pain. (Patient not taking: Reported on 02/16/2019), Disp: 12 tablet, Rfl: 0 .  silver sulfADIAZINE (SILVADENE) 1 % cream, Apply 1 application topically daily. Thin layer to area twice daily (Patient not taking: Reported on 02/16/2019), Disp: 50 g, Rfl: 11 .  Trolamine Salicylate (ASPERCREME EX), Apply topically as needed. , Disp: , Rfl:   Assessment/ Plan: 68 y.o. female   1. Dependent edema  Hydrochlorothiazide 25 mg 1 daily Compression   No follow-ups on file.  Continue all other maintenance medications as listed above.  Start time: 1:56 PM End time: 2:04 PM  No orders of the defined types were placed in this encounter.   Particia Nearing PA-C [place] [phone]

## 2019-04-21 ENCOUNTER — Ambulatory Visit: Payer: BC Managed Care – PPO | Admitting: Physician Assistant

## 2019-05-17 DIAGNOSIS — Z79811 Long term (current) use of aromatase inhibitors: Secondary | ICD-10-CM | POA: Insufficient documentation

## 2019-05-17 DIAGNOSIS — Z79818 Long term (current) use of other agents affecting estrogen receptors and estrogen levels: Secondary | ICD-10-CM | POA: Insufficient documentation

## 2019-05-19 ENCOUNTER — Encounter (HOSPITAL_COMMUNITY): Payer: Self-pay | Admitting: Hematology

## 2019-05-19 ENCOUNTER — Inpatient Hospital Stay (HOSPITAL_COMMUNITY): Payer: BC Managed Care – PPO

## 2019-05-19 ENCOUNTER — Other Ambulatory Visit: Payer: Self-pay

## 2019-05-19 ENCOUNTER — Encounter (HOSPITAL_COMMUNITY): Payer: Self-pay | Admitting: *Deleted

## 2019-05-19 ENCOUNTER — Inpatient Hospital Stay (HOSPITAL_COMMUNITY): Payer: BC Managed Care – PPO | Attending: Hematology | Admitting: Hematology

## 2019-05-19 VITALS — BP 146/66 | HR 97 | Temp 97.3°F | Resp 18 | Wt 210.1 lb

## 2019-05-19 DIAGNOSIS — C50512 Malignant neoplasm of lower-outer quadrant of left female breast: Secondary | ICD-10-CM | POA: Diagnosis not present

## 2019-05-19 DIAGNOSIS — Z79811 Long term (current) use of aromatase inhibitors: Secondary | ICD-10-CM

## 2019-05-19 DIAGNOSIS — M2669 Other specified disorders of temporomandibular joint: Secondary | ICD-10-CM | POA: Insufficient documentation

## 2019-05-19 DIAGNOSIS — N951 Menopausal and female climacteric states: Secondary | ICD-10-CM | POA: Insufficient documentation

## 2019-05-19 DIAGNOSIS — Z170 Estrogen receptor positive status [ER+]: Secondary | ICD-10-CM

## 2019-05-19 DIAGNOSIS — M8580 Other specified disorders of bone density and structure, unspecified site: Secondary | ICD-10-CM | POA: Insufficient documentation

## 2019-05-19 DIAGNOSIS — C50912 Malignant neoplasm of unspecified site of left female breast: Secondary | ICD-10-CM | POA: Insufficient documentation

## 2019-05-19 DIAGNOSIS — Z79818 Long term (current) use of other agents affecting estrogen receptors and estrogen levels: Secondary | ICD-10-CM

## 2019-05-19 LAB — CBC WITH DIFFERENTIAL/PLATELET
Abs Immature Granulocytes: 0.01 10*3/uL (ref 0.00–0.07)
Basophils Absolute: 0.1 10*3/uL (ref 0.0–0.1)
Basophils Relative: 1 %
Eosinophils Absolute: 0.2 10*3/uL (ref 0.0–0.5)
Eosinophils Relative: 4 %
HCT: 42.1 % (ref 36.0–46.0)
Hemoglobin: 13.9 g/dL (ref 12.0–15.0)
Immature Granulocytes: 0 %
Lymphocytes Relative: 18 %
Lymphs Abs: 1.1 10*3/uL (ref 0.7–4.0)
MCH: 30.8 pg (ref 26.0–34.0)
MCHC: 33 g/dL (ref 30.0–36.0)
MCV: 93.3 fL (ref 80.0–100.0)
Monocytes Absolute: 0.6 10*3/uL (ref 0.1–1.0)
Monocytes Relative: 10 %
Neutro Abs: 4.1 10*3/uL (ref 1.7–7.7)
Neutrophils Relative %: 67 %
Platelets: 184 10*3/uL (ref 150–400)
RBC: 4.51 MIL/uL (ref 3.87–5.11)
RDW: 12.6 % (ref 11.5–15.5)
WBC: 6.1 10*3/uL (ref 4.0–10.5)
nRBC: 0 % (ref 0.0–0.2)

## 2019-05-19 LAB — COMPREHENSIVE METABOLIC PANEL
ALT: 22 U/L (ref 0–44)
AST: 28 U/L (ref 15–41)
Albumin: 4.2 g/dL (ref 3.5–5.0)
Alkaline Phosphatase: 78 U/L (ref 38–126)
Anion gap: 12 (ref 5–15)
BUN: 18 mg/dL (ref 8–23)
CO2: 27 mmol/L (ref 22–32)
Calcium: 9.6 mg/dL (ref 8.9–10.3)
Chloride: 100 mmol/L (ref 98–111)
Creatinine, Ser: 0.68 mg/dL (ref 0.44–1.00)
GFR calc Af Amer: >60 mL/min (ref >60)
GFR calc non Af Amer: >60 mL/min (ref >60)
Glucose, Bld: 101 mg/dL — ABNORMAL HIGH (ref 70–99)
Potassium: 3.4 mmol/L — ABNORMAL LOW (ref 3.5–5.1)
Sodium: 139 mmol/L (ref 135–145)
Total Bilirubin: 0.9 mg/dL (ref 0.3–1.2)
Total Protein: 7.7 g/dL (ref 6.5–8.1)

## 2019-05-19 MED ORDER — DENOSUMAB 60 MG/ML ~~LOC~~ SOSY
60.0000 mg | PREFILLED_SYRINGE | Freq: Once | SUBCUTANEOUS | Status: DC
  Filled 2019-05-19: qty 1

## 2019-05-19 MED ORDER — PROCHLORPERAZINE MALEATE 10 MG PO TABS: 10.0000 mg | ORAL_TABLET | Freq: Three times a day (TID) | ORAL | 3 refills | Status: DC | PRN

## 2019-05-19 MED ORDER — VENLAFAXINE HCL ER 37.5 MG PO CP24: 75.0000 mg | ORAL_CAPSULE | Freq: Every day | ORAL | 3 refills | Status: DC

## 2019-05-19 NOTE — Patient Instructions (Addendum)
[place] at [facility] Discharge Instructions  You were seen today by Dr. Delton Coombes. He went over your recent lab results. He would like you to start taking your cancer pill at night to see if that helps with your nausea. He will send in a new medication to help with your hot flashes, you can take this at night as well. He will see you back in 4 months for labs and follow up.   Thank you for choosing [place] at [facility] to provide your oncology and hematology care.  To afford each patient quality time with our provider, please arrive at least 15 minutes before your scheduled appointment time.   If you have a lab appointment with the [place] [facility] please come in thru the  Main Entrance and check in at the main information desk  You need to re-schedule your appointment should you arrive 10 or more minutes late.  We strive to give you quality time with our providers, and arriving late affects you and other patients whose appointments are after yours.  Also, if you no show three or more times for appointments you may be dismissed from the clinic at the providers discretion.     Again, thank you for choosing [facility].  Our hope is that these requests will decrease the amount of time that you wait before being seen by our physicians.       _____________________________________________________________  Should you have questions after your visit to [facility], please contact our office at [phone] between the hours of 8:00 a.m. and 4:30 p.m.  Voicemails left after 4:00 p.m. will not be returned until the following business day.  For prescription refill requests, have your pharmacy contact our office and allow 72 hours.    Cancer Center Support Programs:   > Cancer Support Group  2nd Tuesday of the month 1pm-2pm, Journey Room

## 2019-05-19 NOTE — Assessment & Plan Note (Signed)
1.  Stage IIIb (T4N0) left breast IDC, ER/PR positive, HER-2 negative: - Patient with known breast mass for the past 3 years, neglected it. -Biopsy of the left breast on 10/20/2018 showed IDC.  Left axillary core biopsy was negative. -Status post left mastectomy on 11/12/2018, 11 cm IDC, grade 2, margins negative, 0/14 lymph node positive, ER/PR positive, HER-2 2+ by IHC, negative by FISH, Ki-67 10%. -Oncotype DX recurrence score of 2. - Because of low Oncotype DX recurrence, I did not recommend any chemotherapy. - Anastrozole started on 12/10/2018. -XRT from 02/01/2019- 03/18/2019. -She complains of hot flashes.  She also complained of nausea when she takes anastrozole in the morning.  I have told her to take anastrozole in the evening.  We have also given prescription for Compazine to be taken as needed for nausea.   2.  Hot flashes: -She gets hot flashes during the day and night. -She tried gabapentin but could not tolerate it. -We talked about starting her on venlafaxine at 37.5 mg, increase the dose to 75 mg after 1 week.  We talked about side effects in detail.  She is agreeable.  We sent a prescription to her pharmacy.  3.  Osteopenia: -We talked about DEXA scan results dated 02/09/2019.  T score is -2. - Vitamin D was 39.2.  She will continue calcium and vitamin D. - At previous visit we talked about Prolia.  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  She does not feel comfortable taking Prolia.  We will hold off on it. -We plan to repeat DEXA scan in 2 years.  

## 2019-05-19 NOTE — Progress Notes (Signed)
Patient has a history of TMJ and was instructed by her dentist not to receive the Prolia shot due to side effects.    No injection today per Dr. Delton Coombes.  Pharmacy made aware.

## 2019-05-19 NOTE — Progress Notes (Signed)
[facility] [place], [place] [address]   CLINIC:  Medical Oncology/Hematology  PCP:  Terald Sleeper, PA-C [place] [address] [phone]   REASON FOR VISIT:  Follow-up for breast cancer     INTERVAL HISTORY:  Anna Carney 68 y.o. female seen for follow-up of breast cancer, osteopenia.  She is taking anastrozole at 10 AM in the morning.  She gets nauseous.  Denied any vomiting.  She gets constipated from calcium supplements.  Appetite is 50%.  Energy levels are 75%.  Patient has not started taking the Prolia injection.  She reports that gabapentin made her feel weird.  She stopped taking it for hot flashes.  Denies any new onset pains.    REVIEW OF SYSTEMS:  Review of Systems  Gastrointestinal: Positive for constipation and nausea.  Endocrine: Positive for hot flashes.  Psychiatric/Behavioral: Positive for sleep disturbance.  All other systems reviewed and are negative.    PAST MEDICAL/SURGICAL HISTORY:  Past Medical History:  Diagnosis Date  . Anxiety   . Arthritis   . Cancer ([facility]) 10/2018   left breast IDC  . Shingles    Past Surgical History:  Procedure Laterality Date  . CHOLECYSTECTOMY    . MASTECTOMY W/ SENTINEL NODE BIOPSY Left 11/12/2018   Procedure: LEFT MASTECTOMY WITH LEFT AXILLARY SENTINEL LYMPH NODE BIOPSY;  Surgeon: Rolm Bookbinder, MD;  Location: [facility];  Service: General;  Laterality: Left;     SOCIAL HISTORY:  Social History   Socioeconomic History  . Marital status: Single    Spouse name: Not on file  . Number of children: Not on file  . Years of education: Not on file  . Highest education level: Not on file  Occupational History  . Occupation: [employer]    Comment: in garden center  . 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 Financial resource strain: Not hard at all  . Food insecurity    Worry: Never true     Inability: Never true  . Transportation needs    Medical: No    Non-medical: No  Tobacco Use  . Smoking status: Former Research scientist (life sciences)  . Smokeless tobacco: Never Used  Substance and Sexual Activity  . Alcohol use: Yes    Comment: rarely  . Drug use: No  . Sexual activity: Not Currently    Birth control/protection: Post-menopausal  Lifestyle  . Physical activity    Days per week: Not on file    Minutes per session: Not on file  . Stress: Rather much  Relationships  . Social Herbalist on phone: Not on file    Gets together: Not on file    Attends religious service: Not on file    Active member of club or organization: Not on file    Attends meetings of clubs or organizations: Not on file    Relationship status: Not on file  . Intimate partner violence    Fear of current or ex partner: Not on file    Emotionally abused: Not on file    Physically abused: Not on file    Forced sexual activity: Not on file  Other Topics Concern  . Not on file  Social History Narrative  . Not on file    FAMILY HISTORY:  Family History  Problem Relation Age of Onset  . Arthritis Mother   . Diabetes Mother   .  Stroke Mother   . Asthma Father   . Colon cancer Brother   . Arthritis Daughter   . Thyroid disease Daughter   . Breast cancer Maternal Aunt     CURRENT MEDICATIONS:  Outpatient Encounter Medications as of 05/19/2019  Medication Sig Note  . anastrozole (ARIMIDEX) 1 MG tablet Take 1 tablet (1 mg total) by mouth daily.   . Ascorbic Acid (VITAMIN C) 1000 MG tablet Take 2,000 mg by mouth daily.   . calcium carbonate (OS-CAL) 1250 (500 Ca) MG chewable tablet Chew 1 tablet by mouth daily.   . cholecalciferol (VITAMIN D3) 25 MCG (1000 UT) tablet Take 2,000 Units by mouth daily.   . Multiple Vitamin (MULTIVITAMIN) tablet Take 1 tablet by mouth daily.   [name] acetaminophen (TYLENOL) 325 MG tablet Take 650 mg by mouth every 6 (six) hours as needed.   . diclofenac sodium (VOLTAREN) 1 %  GEL Apply 2 g topically as needed.    [name] ibuprofen (ADVIL) 200 MG tablet Take 200 mg by mouth as needed.   . prochlorperazine (COMPAZINE) 10 MG tablet Take 1 tablet (10 mg total) by mouth every 8 (eight) hours as needed for nausea or vomiting.   . venlafaxine XR (EFFEXOR-XR) 37.5 MG 24 hr capsule Take 2 capsules (75 mg total) by mouth daily with breakfast. Start with 1 capsule daily for the first 7 days, then increase to 2 capsules daily.   . [DISCONTINUED] ALPRAZolam (XANAX) 0.25 MG tablet Take 1 tablet (0.25 mg total) by mouth 2 (two) times daily as needed for anxiety.   . [DISCONTINUED] escitalopram (LEXAPRO) 5 MG tablet Take 1 tablet (5 mg total) by mouth daily.   . [DISCONTINUED] gabapentin (NEURONTIN) 300 MG capsule Take 1 capsule (300 mg total) by mouth at bedtime. (Patient not taking: Reported on 05/19/2019)   . [DISCONTINUED] hydrochlorothiazide (HYDRODIURIL) 25 MG tablet Take 25 mg by mouth daily.   . [DISCONTINUED] methocarbamol (ROBAXIN) 500 MG tablet Take by mouth.   . [DISCONTINUED] oxyCODONE (OXY IR/ROXICODONE) 5 MG immediate release tablet Take 1 tablet (5 mg total) by mouth every 4 (four) hours as needed for moderate pain. (Patient not taking: Reported on 02/16/2019)   . [DISCONTINUED] silver sulfADIAZINE (SILVADENE) 1 % cream Apply 1 application topically daily. Thin layer to area twice daily (Patient not taking: Reported on 02/16/2019)   . [DISCONTINUED] Trolamine Salicylate (ASPERCREME EX) Apply topically as needed.    . [DISCONTINUED] denosumab (PROLIA) injection 60 mg  05/19/2019: Patient has TMJ dentist said NO to Prolia   No facility-administered encounter medications on file as of 05/19/2019.     ALLERGIES:  Allergies  Allergen Reactions  . Sulfa Antibiotics Nausea Only     PHYSICAL EXAM:  ECOG Performance status: 1  Vitals:   05/19/19 1100  BP: (!) 146/66  Pulse: 97  Resp: 18  Temp: (!) 97.3 F (36.3 C)  SpO2: 98%   Filed Weights   05/19/19 1100  Weight: 210  lb 1.6 oz (95.3 kg)    Physical Exam Vitals signs reviewed.  Constitutional:      Appearance: Normal appearance.  Cardiovascular:     Rate and Rhythm: Normal rate and regular rhythm.     Heart sounds: Normal heart sounds.  Pulmonary:     Breath sounds: Normal breath sounds.  Abdominal:     General: There is no distension.     Palpations: Abdomen is soft. There is no mass.  Musculoskeletal:        General: No swelling.  Skin:    General: Skin is warm.  Neurological:     Mental Status: She is alert and oriented to person, place, and time.  Psychiatric:        Mood and Affect: Mood normal.        Behavior: Behavior normal.    Breast exam did not reveal any palpable masses.  No palpable adenopathy.  Left mastectomy site is within normal limits.  LABORATORY DATA:  I have reviewed the labs as listed.  CBC    Component Value Date/Time   WBC 6.1 05/19/2019 0954   RBC 4.51 05/19/2019 0954   HGB 13.9 05/19/2019 0954   HGB 13.8 10/21/2018 1657   HCT 42.1 05/19/2019 0954   HCT 40.1 10/21/2018 1657   PLT 184 05/19/2019 0954   PLT 245 10/21/2018 1657   MCV 93.3 05/19/2019 0954   MCV 90 10/21/2018 1657   MCH 30.8 05/19/2019 0954   MCHC 33.0 05/19/2019 0954   RDW 12.6 05/19/2019 0954   RDW 11.8 10/21/2018 1657   LYMPHSABS 1.1 05/19/2019 0954   LYMPHSABS 1.6 10/21/2018 1657   MONOABS 0.6 05/19/2019 0954   EOSABS 0.2 05/19/2019 0954   EOSABS 0.2 10/21/2018 1657   BASOSABS 0.1 05/19/2019 0954   BASOSABS 0.1 10/21/2018 1657   CMP Latest Ref Rng & Units 05/19/2019 02/09/2019 10/21/2018  Glucose 70 - 99 mg/dL 101(H) 96 105(H)  BUN 8 - 23 mg/dL '18 19 14  ' Creatinine 0.44 - 1.00 mg/dL 0.68 0.74 0.65  Sodium 135 - 145 mmol/L 139 139 144  Potassium 3.5 - 5.1 mmol/L 3.4(L) 4.0 3.9  Chloride 98 - 111 mmol/L 100 103 106  CO2 22 - 32 mmol/L '27 26 23  ' Calcium 8.9 - 10.3 mg/dL 9.6 9.0 9.3  Total Protein 6.5 - 8.1 g/dL 7.7 7.4 6.6  Total Bilirubin 0.3 - 1.2 mg/dL 0.9 0.8 0.4  Alkaline  Phos 38 - 126 U/L 78 67 80  AST 15 - 41 U/L '28 20 21  ' ALT 0 - 44 U/L '22 17 14       ' DIAGNOSTIC IMAGING:  I have independently reviewed the scans and discussed with the patient.   I have reviewed Venita Lick LPN's note and agree with the documentation.  I personally performed a face-to-face visit, made revisions and my assessment and plan is as follows.    ASSESSMENT & PLAN:   Malignant neoplasm of lower-outer quadrant of left breast of female, estrogen receptor positive (HCC) 1.  Stage IIIb (T4N0) left breast IDC, ER/PR positive, HER-2 negative: - Patient with known breast mass for the past 3 years, neglected it. -Biopsy of the left breast on 10/20/2018 showed IDC.  Left axillary core biopsy was negative. -Status post left mastectomy on 11/12/2018, 11 cm IDC, grade 2, margins negative, 0/14 lymph node positive, ER/PR positive, HER-2 2+ by IHC, negative by FISH, Ki-67 10%. -Oncotype DX recurrence score of 2. - Because of low Oncotype DX recurrence, I did not recommend any chemotherapy. - Anastrozole started on 12/10/2018. -XRT from 02/01/2019- 03/18/2019. -She complains of hot flashes.  She also complained of nausea when she takes anastrozole in the morning.  I have told her to take anastrozole in the evening.  We have also given prescription for Compazine to be taken as needed for nausea.   2.  Hot flashes: -She gets hot flashes during the day and night. -She tried gabapentin but could not tolerate it. -We talked about starting her on venlafaxine at 37.5 mg, increase the dose  to 75 mg after 1 week.  We talked about side effects in detail.  She is agreeable.  We sent a prescription to her pharmacy.  3.  Osteopenia: -We talked about DEXA scan results dated 02/09/2019.  T score is -2. - Vitamin D was 39.2.  She will continue calcium and vitamin D. - At previous visit we talked about Prolia.  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  She does not feel comfortable taking Prolia.  We will hold  off on it. -We plan to repeat DEXA scan in 2 years.    Total time spent is 25 minutes with more than 50% of the time spent face-to-face discussing side effect management, counseling and coordination of care.  Orders placed this encounter:  Orders Placed This Encounter  Procedures  . CBC with Differential/Platelet  . Comprehensive metabolic panel  . Cancer antigen 15-3      Derek Jack, MD [facility] [phone]

## 2019-06-22 ENCOUNTER — Other Ambulatory Visit (HOSPITAL_COMMUNITY): Payer: Self-pay | Admitting: Hematology

## 2019-06-22 DIAGNOSIS — C50512 Malignant neoplasm of lower-outer quadrant of left female breast: Secondary | ICD-10-CM

## 2019-07-26 ENCOUNTER — Other Ambulatory Visit (HOSPITAL_COMMUNITY): Payer: Self-pay | Admitting: Nurse Practitioner

## 2019-07-26 DIAGNOSIS — Z170 Estrogen receptor positive status [ER+]: Secondary | ICD-10-CM

## 2019-07-26 DIAGNOSIS — C50512 Malignant neoplasm of lower-outer quadrant of left female breast: Secondary | ICD-10-CM

## 2019-08-23 ENCOUNTER — Other Ambulatory Visit (HOSPITAL_COMMUNITY): Payer: Self-pay | Admitting: Nurse Practitioner

## 2019-08-23 DIAGNOSIS — C50512 Malignant neoplasm of lower-outer quadrant of left female breast: Secondary | ICD-10-CM

## 2019-08-23 DIAGNOSIS — Z170 Estrogen receptor positive status [ER+]: Secondary | ICD-10-CM

## 2019-09-13 ENCOUNTER — Inpatient Hospital Stay (HOSPITAL_COMMUNITY): Payer: BC Managed Care – PPO | Attending: Hematology

## 2019-09-13 ENCOUNTER — Other Ambulatory Visit: Payer: Self-pay

## 2019-09-13 DIAGNOSIS — Z170 Estrogen receptor positive status [ER+]: Secondary | ICD-10-CM

## 2019-09-13 DIAGNOSIS — C50512 Malignant neoplasm of lower-outer quadrant of left female breast: Secondary | ICD-10-CM | POA: Diagnosis present

## 2019-09-13 LAB — COMPREHENSIVE METABOLIC PANEL
ALT: 23 U/L (ref 0–44)
AST: 25 U/L (ref 15–41)
Albumin: 3.9 g/dL (ref 3.5–5.0)
Alkaline Phosphatase: 67 U/L (ref 38–126)
Anion gap: 11 (ref 5–15)
BUN: 24 mg/dL — ABNORMAL HIGH (ref 8–23)
CO2: 29 mmol/L (ref 22–32)
Calcium: 9.4 mg/dL (ref 8.9–10.3)
Chloride: 98 mmol/L (ref 98–111)
Creatinine, Ser: 0.64 mg/dL (ref 0.44–1.00)
GFR calc Af Amer: >60 mL/min (ref >60)
GFR calc non Af Amer: >60 mL/min (ref >60)
Glucose, Bld: 97 mg/dL (ref 70–99)
Potassium: 3.6 mmol/L (ref 3.5–5.1)
Sodium: 138 mmol/L (ref 135–145)
Total Bilirubin: 0.5 mg/dL (ref 0.3–1.2)
Total Protein: 7 g/dL (ref 6.5–8.1)

## 2019-09-13 LAB — CBC WITH DIFFERENTIAL/PLATELET
Abs Immature Granulocytes: 0.01 10*3/uL (ref 0.00–0.07)
Basophils Absolute: 0.1 10*3/uL (ref 0.0–0.1)
Basophils Relative: 1 %
Eosinophils Absolute: 0.3 10*3/uL (ref 0.0–0.5)
Eosinophils Relative: 6 %
HCT: 40 % (ref 36.0–46.0)
Hemoglobin: 13.4 g/dL (ref 12.0–15.0)
Immature Granulocytes: 0 %
Lymphocytes Relative: 22 %
Lymphs Abs: 1.3 10*3/uL (ref 0.7–4.0)
MCH: 31.8 pg (ref 26.0–34.0)
MCHC: 33.5 g/dL (ref 30.0–36.0)
MCV: 94.8 fL (ref 80.0–100.0)
Monocytes Absolute: 0.5 10*3/uL (ref 0.1–1.0)
Monocytes Relative: 9 %
Neutro Abs: 3.7 10*3/uL (ref 1.7–7.7)
Neutrophils Relative %: 62 %
Platelets: 170 10*3/uL (ref 150–400)
RBC: 4.22 MIL/uL (ref 3.87–5.11)
RDW: 12.4 % (ref 11.5–15.5)
WBC: 5.9 10*3/uL (ref 4.0–10.5)
nRBC: 0 % (ref 0.0–0.2)

## 2019-09-14 LAB — CANCER ANTIGEN 15-3: CA 15-3: 21.3 U/mL (ref 0.0–25.0)

## 2019-09-20 ENCOUNTER — Ambulatory Visit (HOSPITAL_COMMUNITY): Payer: BC Managed Care – PPO | Admitting: Hematology

## 2019-09-20 ENCOUNTER — Other Ambulatory Visit: Payer: Self-pay

## 2019-09-20 ENCOUNTER — Inpatient Hospital Stay (HOSPITAL_COMMUNITY): Payer: BC Managed Care – PPO | Attending: Hematology | Admitting: Hematology

## 2019-09-20 ENCOUNTER — Encounter (HOSPITAL_COMMUNITY): Payer: Self-pay | Admitting: Hematology

## 2019-09-20 VITALS — BP 153/78 | HR 94 | Temp 97.1°F | Resp 18 | Wt 217.4 lb

## 2019-09-20 DIAGNOSIS — Z170 Estrogen receptor positive status [ER+]: Secondary | ICD-10-CM

## 2019-09-20 DIAGNOSIS — Z79818 Long term (current) use of other agents affecting estrogen receptors and estrogen levels: Secondary | ICD-10-CM | POA: Diagnosis not present

## 2019-09-20 DIAGNOSIS — N951 Menopausal and female climacteric states: Secondary | ICD-10-CM | POA: Diagnosis not present

## 2019-09-20 DIAGNOSIS — M8580 Other specified disorders of bone density and structure, unspecified site: Secondary | ICD-10-CM | POA: Insufficient documentation

## 2019-09-20 DIAGNOSIS — Z79811 Long term (current) use of aromatase inhibitors: Secondary | ICD-10-CM | POA: Diagnosis not present

## 2019-09-20 DIAGNOSIS — Z1231 Encounter for screening mammogram for malignant neoplasm of breast: Secondary | ICD-10-CM

## 2019-09-20 DIAGNOSIS — C50512 Malignant neoplasm of lower-outer quadrant of left female breast: Secondary | ICD-10-CM

## 2019-09-20 NOTE — Assessment & Plan Note (Addendum)
1.  Stage IIIb (T4N0) left breast IDC, ER/PR positive, HER-2 negative: - Patient with known breast mass for the past 3 years, neglected it. -Biopsy of the left breast on 10/20/2018 showed IDC.  Left axillary core biopsy was negative. -Status post left mastectomy on 11/12/2018, 11 cm IDC, grade 2, margins negative, 0/14 lymph node positive, ER/PR positive, HER-2 2+ by IHC, negative by FISH, Ki-67 10%. -Oncotype DX recurrence score of 2. - Because of low Oncotype DX recurrence, I did not recommend any chemotherapy. - Anastrozole started on 12/10/2018. -XRT from 02/01/2019- 03/18/2019. -She is taking anastrozole at bedtime.  She is having about 4 hot flashes per night. -She will continue anastrozole.  We will schedule her right breast mammogram in March.  I will see her back in 4 months for follow-up.  Today's physical examination did not reveal any evidence of recurrence.  I reviewed her labs which are grossly within normal limits.   2.  Hot flashes: -I gave her prescription for Effexor last visit.  Apparently she had problems with her insurance and she never started. -She is taking Arimidex at bedtime.  She is having 3-4 hot flashes at nighttime which are manageable.  She does not want to have anything at this time.  3.  Osteopenia: -We talked about DEXA scan results dated 02/09/2019.  T score is -2. 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 -She was told to continue vitamin D 3000 units daily.  Will check vitamin D at next visit. -We will plan to repeat DEXA scan in 2 years from the last.

## 2019-09-20 NOTE — Patient Instructions (Addendum)
[facility] Discharge Instructions  You were seen today by Dr. Delton Coombes. He went over your recent lab results. Continue taking the Arimidex. He will schedule you a mammogram in March. He will see you back in 4 months for labs and follow up.   Thank you for choosing [facility] at [facility] to provide your oncology and hematology care.  To afford each patient quality time with our provider, please arrive at least 15 minutes before your scheduled appointment time.   If you have a lab appointment with the [facility] please come in thru the  Main Entrance and check in at the main information desk  You need to re-schedule your appointment should you arrive 10 or more minutes late.  We strive to give you quality time with our providers, and arriving late affects you and other patients whose appointments are after yours.  Also, if you no show three or more times for appointments you may be dismissed from the clinic at the providers discretion.     Again, thank you for choosing [facility].  Our hope is that these requests will decrease the amount of time that you wait before being seen by our physicians.       _____________________________________________________________  Should you have questions after your visit to [facility], please contact our office at [phone] between the hours of 8:00 a.m. and 4:30 p.m.  Voicemails left after 4:00 p.m. will not be returned until the following business day.  For prescription refill requests, have your pharmacy contact our office and allow 72 hours.    Cancer Center Support Programs:   > Cancer Support Group  2nd Tuesday of the month 1pm-2pm, Journey Room

## 2019-09-20 NOTE — Progress Notes (Signed)
[facility] [place], [place] [address]   CLINIC:  Medical Oncology/Hematology  PCP:  Terald Sleeper, PA-C [place] [address] [phone]   REASON FOR VISIT:  Follow-up for breast cancer     INTERVAL HISTORY:  Ms. Camerer 69 y.o. female seen for follow-up of breast cancer and osteopenia.  She has switched taking Arimidex at bedtime.  She reports hot flashes about 3-4 per night.  Sometimes they wake her up.  She never started Effexor.  She is taking vitamin D 1000 units daily.  Denies any new onset pains.    REVIEW OF SYSTEMS:  Review of Systems  Gastrointestinal: Positive for constipation and nausea.  Endocrine: Positive for hot flashes.  Psychiatric/Behavioral: Positive for sleep disturbance.  All other systems reviewed and are negative.    PAST MEDICAL/SURGICAL HISTORY:  Past Medical History:  Diagnosis Date  . Anxiety   . Arthritis   . Cancer ([place] [facility]) 10/2018   left breast IDC  . Shingles    Past Surgical History:  Procedure Laterality Date  . CHOLECYSTECTOMY    . MASTECTOMY W/ SENTINEL NODE BIOPSY Left 11/12/2018   Procedure: LEFT MASTECTOMY WITH LEFT AXILLARY SENTINEL LYMPH NODE BIOPSY;  Surgeon: Rolm Bookbinder, MD;  Location: [facility];  Service: General;  Laterality: Left;     SOCIAL HISTORY:  Social History   Socioeconomic History  . Marital status: Single    Spouse name: Not on file  . Number of children: Not on file  . Years of education: Not on file  . Highest education level: Not on file  Occupational History  . Occupation: [employer]    Comment: in garden center  . Occupation: [employer]    Comment: cafeteria and bus driver  Tobacco Use  . Smoking status: Former Research scientist (life sciences)  . Smokeless tobacco: Never Used  Substance and Sexual Activity  . Alcohol use: Yes    Comment: rarely  . Drug use: No  . Sexual activity: Not Currently    Birth control/protection:  Post-menopausal  Other Topics Concern  . Not on file  Social History Narrative  . Not on file   Social Determinants of Health   Financial Resource Strain: Low Risk   . Difficulty of Paying Living Expenses: Not hard at all  Food Insecurity: No Food Insecurity  . Worried About Charity fundraiser in the Last Year: Never true  . Ran Out of Food in the Last Year: Never true  Transportation Needs: No Transportation Needs  . Lack of Transportation (Medical): No  . Lack of Transportation (Non-Medical): No  Physical Activity:   . Days of Exercise per Week: Not on file  . Minutes of Exercise per Session: Not on file  Stress: Stress Concern Present  . Feeling of Stress : Rather much  Social Connections:   . Frequency of Communication with Friends and Family: Not on file  . Frequency of Social Gatherings with Friends and Family: Not on file  . Attends Religious Services: Not on file  . Active Member of Clubs or Organizations: Not on file  . Attends Archivist Meetings: Not on file  . Marital Status: Not on file  Intimate Partner Violence:   . Fear of Current or Ex-Partner: Not on file  . Emotionally Abused: Not on file  . Physically Abused: Not on file  . Sexually Abused: Not on file    FAMILY HISTORY:  Family History  Problem Relation Age of Onset  . Arthritis Mother   . Diabetes Mother   . Stroke Mother   . Asthma Father   . Colon cancer Brother   . Arthritis Daughter   . Thyroid disease Daughter   . Breast cancer Maternal Aunt     CURRENT MEDICATIONS:  Outpatient Encounter Medications as of 09/20/2019  Medication Sig  . anastrozole (ARIMIDEX) 1 MG tablet Take 1 tablet by mouth once daily  . Ascorbic Acid (VITAMIN C) 1000 MG tablet Take 2,000 mg by mouth daily.  . calcium carbonate (OS-CAL) 1250 (500 Ca) MG chewable tablet Chew 1 tablet by mouth daily.  . cholecalciferol (VITAMIN D3) 25 MCG (1000 UT) tablet Take 2,000 Units by mouth daily.  . hydrochlorothiazide  (HYDRODIURIL) 25 MG tablet Take 25 mg by mouth daily.  . Multiple Vitamin (MULTIVITAMIN) tablet Take 1 tablet by mouth daily.  [name] venlafaxine XR (EFFEXOR-XR) 37.5 MG 24 hr capsule Take 2 capsules (75 mg total) by mouth daily with breakfast. Start with 1 capsule daily for the first 7 days, then increase to 2 capsules daily.  [name] acetaminophen (TYLENOL) 325 MG tablet Take 650 mg by mouth every 6 (six) hours as needed.  . diclofenac sodium (VOLTAREN) 1 % GEL Apply 2 g topically as needed.   [name] ibuprofen (ADVIL) 200 MG tablet Take 200 mg by mouth as needed.  . prochlorperazine (COMPAZINE) 10 MG tablet Take 1 tablet (10 mg total) by mouth every 8 (eight) hours as needed for nausea or vomiting. (Patient not taking: Reported on 09/20/2019)   No facility-administered encounter medications on file as of 09/20/2019.    ALLERGIES:  Allergies  Allergen Reactions  . Sulfa Antibiotics Nausea Only     PHYSICAL EXAM:  ECOG Performance status: 1  Vitals:   09/20/19 1144  BP: (!) 153/78  Pulse: 94  Resp: 18  Temp: (!) 97.1 F (36.2 C)  SpO2: 99%   Filed Weights   09/20/19 1144  Weight: 217 lb 6.4 oz (98.6 kg)    Physical Exam Vitals reviewed.  Constitutional:      Appearance: Normal appearance.  Cardiovascular:     Rate and Rhythm: Normal rate and regular rhythm.     Heart sounds: Normal heart sounds.  Pulmonary:     Breath sounds: Normal breath sounds.  Abdominal:     General: There is no distension.     Palpations: Abdomen is soft. There is no mass.  Musculoskeletal:        General: No swelling.  Skin:    General: Skin is warm.  Neurological:     Mental Status: She is alert and oriented to person, place, and time.  Psychiatric:        Mood and Affect: Mood normal.        Behavior: Behavior normal.    Breast exam did not reveal any palpable masses.  No palpable adenopathy.  Left mastectomy site is within normal limits.  LABORATORY DATA:  I have reviewed the labs as listed.  CBC     Component Value Date/Time   WBC 5.9 09/13/2019 1016   RBC 4.22 09/13/2019 1016   HGB 13.4 09/13/2019 1016   HGB 13.8 10/21/2018 1657   HCT 40.0 09/13/2019 1016   HCT 40.1 10/21/2018 1657   PLT 170 09/13/2019 1016   PLT 245 10/21/2018 1657   MCV 94.8 09/13/2019 1016   MCV 90 10/21/2018 1657   MCH 31.8 09/13/2019 1016   MCHC 33.5 09/13/2019 1016  RDW 12.4 09/13/2019 1016   RDW 11.8 10/21/2018 1657   LYMPHSABS 1.3 09/13/2019 1016   LYMPHSABS 1.6 10/21/2018 1657   MONOABS 0.5 09/13/2019 1016   EOSABS 0.3 09/13/2019 1016   EOSABS 0.2 10/21/2018 1657   BASOSABS 0.1 09/13/2019 1016   BASOSABS 0.1 10/21/2018 1657   CMP Latest Ref Rng & Units 09/13/2019 05/19/2019 02/09/2019  Glucose 70 - 99 mg/dL 97 101(H) 96  BUN 8 - 23 mg/dL 24(H) 18 19  Creatinine 0.44 - 1.00 mg/dL 0.64 0.68 0.74  Sodium 135 - 145 mmol/L 138 139 139  Potassium 3.5 - 5.1 mmol/L 3.6 3.4(L) 4.0  Chloride 98 - 111 mmol/L 98 100 103  CO2 22 - 32 mmol/L _0 Calcium 8.9 - 10.3 mg/dL 9.4 9.6 9.0  Total Protein 6.5 - 8.1 g/dL 7.0 7.7 7.4  Total Bilirubin 0.3 - 1.2 mg/dL 0.5 0.9 0.8  Alkaline Phos 38 - 126 U/L 67 78 67  AST 15 - 41 U/L _1 ALT 0 - 44 U/L _2 DIAGNOSTIC IMAGING:  I have independently reviewed the scans and discussed with the patient.   I have reviewed Venita Lick LPN's note and agree with the documentation.  I personally performed a face-to-face visit, made revisions and my assessment and plan is as follows.    ASSESSMENT & PLAN:   Malignant neoplasm of lower-outer quadrant of left breast of female, estrogen receptor positive (HCC) 1.  Stage IIIb (T4N0) left breast IDC, ER/PR positive, HER-2 negative: - Patient with known breast mass for the past 3 years, neglected it. -Biopsy of the left breast on 10/20/2018 showed IDC.  Left axillary core biopsy was negative. -Status post left mastectomy on 11/12/2018, 11 cm IDC, grade 2, margins negative, 0/14 lymph node positive,  ER/PR positive, HER-2 2+ by IHC, negative by FISH, Ki-67 10%. -Oncotype DX recurrence score of 2. - Because of low Oncotype DX recurrence, I did not recommend any chemotherapy. - Anastrozole started on 12/10/2018. -XRT from 02/01/2019- 03/18/2019. -She is taking anastrozole at bedtime.  She is having about 4 hot flashes per night. -She will continue anastrozole.  We will schedule her right breast mammogram in March.  I will see her back in 4 months for follow-up.  Today's physical examination did not reveal any evidence of recurrence.  I reviewed her labs which are grossly within normal limits.   2.  Hot flashes: -I gave her prescription for Effexor last visit.  Apparently she had problems with her insurance and she never started. -She is taking Arimidex at bedtime.  She is having 3-4 hot flashes at nighttime which are manageable.  She does not want to have anything at this time.  3.  Osteopenia: -We talked about DEXA scan results dated 02/09/2019.  T score is -2. 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 -She was told to continue vitamin D 3000 units daily.  Will check vitamin D at next visit. -We will plan to repeat DEXA scan in 2 years from the last.    Orders placed this encounter:  Orders Placed This Encounter  Procedures  . MM 3D SCREEN BREAST UNI RIGHT  . CBC with Differential/Platelet  . Comprehensive metabolic panel  . Vitamin D 25 hydroxy      Derek Jack, MD [place] [phone]

## 2019-09-21 ENCOUNTER — Other Ambulatory Visit (HOSPITAL_COMMUNITY): Payer: Self-pay | Admitting: Nurse Practitioner

## 2019-09-21 DIAGNOSIS — C50512 Malignant neoplasm of lower-outer quadrant of left female breast: Secondary | ICD-10-CM

## 2019-09-24 ENCOUNTER — Encounter (HOSPITAL_COMMUNITY): Payer: Self-pay

## 2019-09-27 ENCOUNTER — Encounter (HOSPITAL_COMMUNITY): Payer: Self-pay | Admitting: *Deleted

## 2019-10-11 ENCOUNTER — Encounter (HOSPITAL_COMMUNITY): Payer: Self-pay | Admitting: *Deleted

## 2019-10-13 ENCOUNTER — Other Ambulatory Visit (HOSPITAL_COMMUNITY): Payer: Self-pay | Admitting: Hematology

## 2019-10-19 ENCOUNTER — Other Ambulatory Visit (HOSPITAL_COMMUNITY): Payer: Self-pay | Admitting: Nurse Practitioner

## 2019-10-19 DIAGNOSIS — C50512 Malignant neoplasm of lower-outer quadrant of left female breast: Secondary | ICD-10-CM

## 2019-10-19 DIAGNOSIS — Z170 Estrogen receptor positive status [ER+]: Secondary | ICD-10-CM

## 2019-10-25 ENCOUNTER — Ambulatory Visit (HOSPITAL_COMMUNITY): Payer: BC Managed Care – PPO

## 2019-11-21 ENCOUNTER — Other Ambulatory Visit (HOSPITAL_COMMUNITY): Payer: Self-pay | Admitting: Nurse Practitioner

## 2019-11-21 DIAGNOSIS — Z170 Estrogen receptor positive status [ER+]: Secondary | ICD-10-CM

## 2019-11-21 DIAGNOSIS — C50512 Malignant neoplasm of lower-outer quadrant of left female breast: Secondary | ICD-10-CM

## 2019-11-22 ENCOUNTER — Ambulatory Visit (HOSPITAL_COMMUNITY): Payer: BC Managed Care – PPO

## 2019-12-06 ENCOUNTER — Ambulatory Visit (HOSPITAL_COMMUNITY)
Admission: RE | Admit: 2019-12-06 | Discharge: 2019-12-06 | Disposition: A | Discharge status: Home / Self Care | Payer: BC Managed Care – PPO | Source: Ambulatory Visit | Attending: Hematology | Admitting: Hematology

## 2019-12-06 ENCOUNTER — Other Ambulatory Visit: Payer: Self-pay

## 2019-12-06 DIAGNOSIS — Z1231 Encounter for screening mammogram for malignant neoplasm of breast: Secondary | ICD-10-CM | POA: Insufficient documentation

## 2019-12-23 ENCOUNTER — Other Ambulatory Visit (HOSPITAL_COMMUNITY): Payer: Self-pay | Admitting: Nurse Practitioner

## 2019-12-23 ENCOUNTER — Ambulatory Visit: Payer: BC Managed Care – PPO | Admitting: Family Medicine

## 2019-12-23 DIAGNOSIS — C50512 Malignant neoplasm of lower-outer quadrant of left female breast: Secondary | ICD-10-CM

## 2019-12-27 ENCOUNTER — Encounter: Payer: Self-pay | Admitting: Nurse Practitioner

## 2019-12-27 ENCOUNTER — Other Ambulatory Visit: Payer: Self-pay

## 2019-12-27 ENCOUNTER — Ambulatory Visit: Payer: BC Managed Care – PPO | Admitting: Nurse Practitioner

## 2019-12-27 VITALS — BP 167/95 | HR 73 | Temp 98.5°F | Ht 64.0 in | Wt 221.2 lb

## 2019-12-27 DIAGNOSIS — I10 Essential (primary) hypertension: Secondary | ICD-10-CM | POA: Insufficient documentation

## 2019-12-27 MED ORDER — LISINOPRIL-HYDROCHLOROTHIAZIDE 10-12.5 MG PO TABS: 1.0000 | ORAL_TABLET | Freq: Every day | ORAL | 0 refills | Status: DC

## 2019-12-27 NOTE — Patient Instructions (Addendum)
Patients blood pressure is not well controlled. Changed Hydrochlothiazide to to lisinopril-hydrochlorothiazide 10-12.5 mg daily. Provided education on continued diet changes, decrease salt intake, start or increase daily exeresis, keeping a blood pressure log for one week and calling it in. Patient will follow up in 2 weeks.   Hypertension, Adult Hypertension is another name for high blood pressure. High blood pressure forces your heart to work harder to pump blood. This can cause problems over time. There are two numbers in a blood pressure reading. There is a top number (systolic) over a bottom number (diastolic). It is best to have a blood pressure that is below 120/80. Healthy choices can help lower your blood pressure, or you may need medicine to help lower it. What are the causes? The cause of this condition is not known. Some conditions may be related to high blood pressure. What increases the risk?  Smoking.  Having type 2 diabetes mellitus, high cholesterol, or both.  Not getting enough exercise or physical activity.  Being overweight.  Having too much fat, sugar, calories, or salt (sodium) in your diet.  Drinking too much alcohol.  Having long-term (chronic) kidney disease.  Having a family history of high blood pressure.  Age. Risk increases with age.  Race. You may be at higher risk if you are African American.  Gender. Men are at higher risk than women before age 27. After age 12, women are at higher risk than men.  Having obstructive sleep apnea.  Stress. What are the signs or symptoms?  High blood pressure may not cause symptoms. Very high blood pressure (hypertensive crisis) may cause: ? Headache. ? Feelings of worry or nervousness (anxiety). ? Shortness of breath. ? Nosebleed. ? A feeling of being sick to your stomach (nausea). ? Throwing up (vomiting). ? Changes in how you see. ? Very bad chest pain. ? Seizures. How is this treated?  This condition  is treated by making healthy lifestyle changes, such as: ? Eating healthy foods. ? Exercising more. ? Drinking less alcohol.  Your health care provider may prescribe medicine if lifestyle changes are not enough to get your blood pressure under control, and if: ? Your top number is above 130. ? Your bottom number is above 80.  Your personal target blood pressure may vary. Follow these instructions at home: Eating and drinking   If told, follow the DASH eating plan. To follow this plan: ? Fill one half of your plate at each meal with fruits and vegetables. ? Fill one fourth of your plate at each meal with whole grains. Whole grains include whole-wheat pasta, brown rice, and whole-grain bread. ? Eat or drink low-fat dairy products, such as skim milk or low-fat yogurt. ? Fill one fourth of your plate at each meal with low-fat (lean) proteins. Low-fat proteins include fish, chicken without skin, eggs, beans, and tofu. ? Avoid fatty meat, cured and processed meat, or chicken with skin. ? Avoid pre-made or processed food.  Eat less than 1,500 mg of salt each day.  Do not drink alcohol if: ? Your doctor tells you not to drink. ? You are pregnant, may be pregnant, or are planning to become pregnant.  If you drink alcohol: ? Limit how much you use to:  0-1 drink a day for women.  0-2 drinks a day for men. ? Be aware of how much alcohol is in your drink. In the [place] one drink equals one 12 oz bottle of beer (355 mL), one 5 oz glass  of wine (148 mL), or one 1 oz glass of hard liquor (44 mL). Lifestyle   Work with your doctor to stay at a healthy weight or to lose weight. Ask your doctor what the best weight is for you.  Get at least 30 minutes of exercise most days of the week. This may include walking, swimming, or biking.  Get at least 30 minutes of exercise that strengthens your muscles (resistance exercise) at least 3 days a week. This may include lifting weights or doing  Pilates.  Do not use any products that contain nicotine or tobacco, such as cigarettes, e-cigarettes, and chewing tobacco. If you need help quitting, ask your doctor.  Check your blood pressure at home as told by your doctor.  Keep all follow-up visits as told by your doctor. This is important. Medicines  Take over-the-counter and prescription medicines only as told by your doctor. Follow directions carefully.  Do not skip doses of blood pressure medicine. The medicine does not work as well if you skip doses. Skipping doses also puts you at risk for problems.  Ask your doctor about side effects or reactions to medicines that you should watch for. Contact a doctor if you:  Think you are having a reaction to the medicine you are taking.  Have headaches that keep coming back (recurring).  Feel dizzy.  Have swelling in your ankles.  Have trouble with your vision. Get help right away if you:  Get a very bad headache.  Start to feel mixed up (confused).  Feel weak or numb.  Feel faint.  Have very bad pain in your: ? Chest. ? Belly (abdomen).  Throw up more than once.  Have trouble breathing. Summary  Hypertension is another name for high blood pressure.  High blood pressure forces your heart to work harder to pump blood.  For most people, a normal blood pressure is less than 120/80.  Making healthy choices can help lower blood pressure. If your blood pressure does not get lower with healthy choices, you may need to take medicine. This information is not intended to replace advice given to you by your health care provider. Make sure you discuss any questions you have with your health care provider. Document Revised: 04/15/2018 Document Reviewed: 04/15/2018 Elsevier Patient Education  2020 [employer].

## 2019-12-27 NOTE — Assessment & Plan Note (Signed)
Patients blood pressure is not well controlled. Changed Hydrochlothiazide to to lisinopril-hydrochlorothiazide 10-12.5 mg daily. Provided education on continued diet changes, decrease salt intake, start or increase daily exeresis, keeping a blood pressure log for one week and calling it in. Patient will follow up in 2 weeks.

## 2019-12-27 NOTE — Progress Notes (Signed)
Established Patient Office Visit  Subjective:  Patient ID: Anna Carney, female    DOB: Jun 08, 1951  Age: 69 y.o. MRN: [id number]  CC:  Chief Complaint  Patient presents with  . Blood Pressure Check    HPI Anna Carney presents for elevated blood pressure and hypertension. Patient was diagnosed in 2020 and started on hydrochlorothiazide 25 mg daily  The patient is tolerating the medication well without side effects . Patient reports her blood pressure is still elevated and fluctuates. She reports diastolic BP at home of [id number] and 140 /72. Compliance with treatment has been good; including taking medication as directed, maintains a healthy diet and following up as directed.  Past Medical History:  Diagnosis Date  . Anxiety   . Arthritis   . 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 Shingles     Past Surgical History:  Procedure Laterality Date  . CHOLECYSTECTOMY    . MASTECTOMY W/ SENTINEL NODE BIOPSY Left 11/12/2018   Procedure: LEFT MASTECTOMY WITH LEFT AXILLARY SENTINEL LYMPH NODE BIOPSY;  Surgeon: Rolm Bookbinder, MD;  Location: [facility];  Service: General;  Laterality: Left;    Family History  Problem Relation Age of Onset  . Arthritis Mother   . Diabetes Mother   . Stroke Mother   . Asthma Father   . Colon cancer Brother   . Arthritis Daughter   . Thyroid disease Daughter   . Breast cancer Maternal Aunt     Social History   Socioeconomic History  . Marital status: Single    Spouse name: Not on file  . Number of children: Not on file  . Years of education: Not on file  . Highest education level: Not on file  Occupational History  . Occupation: [employer]    Comment: in garden center  . Occupation: [employer]    Comment: cafeteria and bus driver  Tobacco Use  . Smoking status: Former Research scientist (life sciences)  . Smokeless tobacco: Never Used  Substance and Sexual Activity  . Alcohol use: Yes    Comment: rarely  . Drug  use: No  . Sexual activity: Not Currently    Birth control/protection: Post-menopausal  Other Topics Concern  . Not on file  Social History Narrative  . Not on file   Social Determinants of Health   Financial Resource Strain:   . Difficulty of Paying Living Expenses:   Food Insecurity:   . Worried About Charity fundraiser in the Last Year:   . Arboriculturist in the Last Year:   Transportation Needs:   . Film/video editor (Medical):   [name] Lack of Transportation (Non-Medical):   Physical Activity:   . Days of Exercise per Week:   . Minutes of Exercise per Session:   Stress:   . Feeling of Stress :   Social Connections:   . Frequency of Communication with Friends and Family:   . Frequency of Social Gatherings with Friends and Family:   . Attends Religious Services:   . Active Member of Clubs or Organizations:   . Attends Archivist Meetings:   [name] Marital Status:   Intimate Partner Violence:   . Fear of Current or Ex-Partner:   . Emotionally Abused:   [name] Physically Abused:   . Sexually Abused:     Outpatient Medications Prior to Visit  Medication Sig Dispense Refill  . acetaminophen (TYLENOL) 325 MG tablet Take 650 mg by mouth every 6 (six)  hours as needed.    [name] anastrozole (ARIMIDEX) 1 MG tablet Take 1 tablet by mouth once daily 30 tablet 0  . Ascorbic Acid (VITAMIN C) 1000 MG tablet Take 2,000 mg by mouth daily.    . calcium carbonate (OS-CAL) 1250 (500 Ca) MG chewable tablet Chew 1 tablet by mouth daily.    . celecoxib (CELEBREX) 200 MG capsule Take 200 mg by mouth daily.    . cholecalciferol (VITAMIN D3) 25 MCG (1000 UT) tablet Take 2,000 Units by mouth daily.    . diclofenac Sodium (VOLTAREN) 1 % GEL Apply 2 g topically 4 (four) times daily.    [name] Calcium (STOOL SOFTENER PO) Take by mouth.    . Multiple Vitamin (MULTIVITAMIN) tablet Take 1 tablet by mouth daily.    . prochlorperazine (COMPAZINE) 10 MG tablet TAKE 1 TABLET BY MOUTH EVERY 8 HOURS AS  NEEDED FOR NAUSEA AND VOMITING 30 tablet 6  . hydrochlorothiazide (HYDRODIURIL) 25 MG tablet Take 25 mg by mouth daily.    [name] ibuprofen (ADVIL) 200 MG tablet Take 200 mg by mouth as needed.    . venlafaxine XR (EFFEXOR-XR) 37.5 MG 24 hr capsule Take 2 capsules (75 mg total) by mouth daily with breakfast. Start with 1 capsule daily for the first 7 days, then increase to 2 capsules daily. 60 capsule 3   No facility-administered medications prior to visit.    Allergies  Allergen Reactions  . Sulfa Antibiotics Nausea Only    ROS Review of Systems  Constitutional: Negative for activity change and appetite change.  Respiratory: Negative for chest tightness and shortness of breath.   Cardiovascular: Negative for chest pain and palpitations.  Gastrointestinal: Negative for abdominal distention and abdominal pain.  Genitourinary: Negative for difficulty urinating.  Musculoskeletal: Negative for arthralgias and myalgias.  Neurological: Negative for light-headedness and headaches.  Psychiatric/Behavioral: Negative.       Objective:    Physical Exam  Constitutional: She is oriented to person, place, and time. She appears well-developed and well-nourished.  HENT:  Head: Normocephalic.  Mouth/Throat: Oropharynx is clear and moist.  Eyes: Conjunctivae are normal.  Cardiovascular: Normal rate and normal heart sounds.  Pulmonary/Chest: Effort normal and breath sounds normal.  Abdominal: Bowel sounds are normal.  Musculoskeletal:        General: No tenderness or edema.     Cervical back: Neck supple.  Neurological: She is oriented to person, place, and time.  Skin: Skin is warm. No erythema.  Psychiatric: She has a normal mood and affect.    BP (!) 167/95   Pulse 73   Temp 98.5 F (36.9 C) (Temporal)   Ht 5\' 4"  (1.626 m)   Wt 221 lb 4 oz (100.4 kg)   BMI 37.98 kg/m  Wt Readings from Last 3 Encounters:  12/27/19 221 lb 4 oz (100.4 kg)  09/20/19 217 lb 6.4 oz (98.6 kg)  05/19/19  210 lb 1.6 oz (95.3 kg)     Health Maintenance Due  Topic Date Due  . Hepatitis C Screening  Never done  . COVID-19 Vaccine (1) Never done  . COLONOSCOPY  Never done    There are no preventive care reminders to display for this patient.  Lab Results  Component Value Date   TSH 0.416 (L) 10/21/2018   Lab Results  Component Value Date   WBC 5.9 09/13/2019   HGB 13.4 09/13/2019   HCT 40.0 09/13/2019   MCV 94.8 09/13/2019   PLT 170 09/13/2019   Lab Results  Component Value Date   NA 138 09/13/2019   K 3.6 09/13/2019   CO2 29 09/13/2019   GLUCOSE 97 09/13/2019   BUN 24 (H) 09/13/2019   CREATININE 0.64 09/13/2019   BILITOT 0.5 09/13/2019   ALKPHOS 67 09/13/2019   AST 25 09/13/2019   ALT 23 09/13/2019   PROT 7.0 09/13/2019   ALBUMIN 3.9 09/13/2019   CALCIUM 9.4 09/13/2019   ANIONGAP 11 09/13/2019   No results found for: CHOL No results found for: HDL No results found for: LDLCALC No results found for: TRIG No results found for: CHOLHDL Lab Results  Component Value Date   HGBA1C 5.1 10/21/2018      Assessment & Plan:   Problem List Items Addressed This Visit      Cardiovascular and Mediastinum   Essential hypertension - Primary    Patients blood pressure is not well controlled. Changed Hydrochlothiazide to to lisinopril-hydrochlorothiazide 10-12.5 mg daily. Provided education on continued diet changes, decrease salt intake, start or increase daily exeresis, keeping a blood pressure log for one week and calling it in. Patient will follow up in 2 weeks.      Relevant Medications   lisinopril-hydrochlorothiazide (ZESTORETIC) 10-12.5 MG tablet      Meds ordered this encounter  Medications  . lisinopril-hydrochlorothiazide (ZESTORETIC) 10-12.5 MG tablet    Sig: Take 1 tablet by mouth daily.    Dispense:  30 tablet    Refill:  0    Order Specific Question:   Supervising Provider    Answer:   Caryl Pina A [id number]    Follow-up: Return in about 2  weeks (around 01/10/2020) for new medicaton f/u.    Ivy Lynn, NP

## 2020-01-03 ENCOUNTER — Telehealth: Payer: Self-pay | Admitting: Family Medicine

## 2020-01-03 NOTE — Telephone Encounter (Signed)
Thank you. I wanted to know if her BP was controlled on the new medication. I will follow up with her on her scheduled appointment. No changes to current dose. Advised to continue keeping BP log for self evaluation ( no need to call in values) And to bring in her BP machine for Nurse check to make sure its working fine. On next appointment.     Thanks    OI

## 2020-01-10 ENCOUNTER — Other Ambulatory Visit: Payer: Self-pay

## 2020-01-10 ENCOUNTER — Encounter: Payer: Self-pay | Admitting: Nurse Practitioner

## 2020-01-10 ENCOUNTER — Ambulatory Visit: Payer: BC Managed Care – PPO | Admitting: Nurse Practitioner

## 2020-01-10 VITALS — BP 122/80 | HR 72 | Temp 98.0°F | Ht 64.0 in | Wt 219.6 lb

## 2020-01-10 DIAGNOSIS — I10 Essential (primary) hypertension: Secondary | ICD-10-CM | POA: Diagnosis not present

## 2020-01-10 NOTE — Assessment & Plan Note (Addendum)
Hypertension is well controlled after 2 weeks follow up. No changes to current medication. Patient will follow up in 3 months. Continue to eat a healthy low salt diet and exercise.

## 2020-01-10 NOTE — Patient Instructions (Addendum)
Essential hypertension Hypertension is well controlled after 2 weeks follow up. No changes to current medication. Patient will follow up in 3 months. Continue to eat a healthy low salt diet and exercise.   Hypertension, Adult Hypertension is another name for high blood pressure. High blood pressure forces your heart to work harder to pump blood. This can cause problems over time. There are two numbers in a blood pressure reading. There is a top number (systolic) over a bottom number (diastolic). It is best to have a blood pressure that is below 120/80. Healthy choices can help lower your blood pressure, or you may need medicine to help lower it. What are the causes? The cause of this condition is not known. Some conditions may be related to high blood pressure. What increases the risk?  Smoking.  Having type 2 diabetes mellitus, high cholesterol, or both.  Not getting enough exercise or physical activity.  Being overweight.  Having too much fat, sugar, calories, or salt (sodium) in your diet.  Drinking too much alcohol.  Having long-term (chronic) kidney disease.  Having a family history of high blood pressure.  Age. Risk increases with age.  Race. You may be at higher risk if you are African American.  Gender. Men are at higher risk than women before age 41. After age 32, women are at higher risk than men.  Having obstructive sleep apnea.  Stress. What are the signs or symptoms?  High blood pressure may not cause symptoms. Very high blood pressure (hypertensive crisis) may cause: ? Headache. ? Feelings of worry or nervousness (anxiety). ? Shortness of breath. ? Nosebleed. ? A feeling of being sick to your stomach (nausea). ? Throwing up (vomiting). ? Changes in how you see. ? Very bad chest pain. ? Seizures. How is this treated?  This condition is treated by making healthy lifestyle changes, such as: ? Eating healthy foods. ? Exercising more. ? Drinking less  alcohol.  Your health care provider may prescribe medicine if lifestyle changes are not enough to get your blood pressure under control, and if: ? Your top number is above 130. ? Your bottom number is above 80.  Your personal target blood pressure may vary. Follow these instructions at home: Eating and drinking   If told, follow the DASH eating plan. To follow this plan: ? Fill one half of your plate at each meal with fruits and vegetables. ? Fill one fourth of your plate at each meal with whole grains. Whole grains include whole-wheat pasta, brown rice, and whole-grain bread. ? Eat or drink low-fat dairy products, such as skim milk or low-fat yogurt. ? Fill one fourth of your plate at each meal with low-fat (lean) proteins. Low-fat proteins include fish, chicken without skin, eggs, beans, and tofu. ? Avoid fatty meat, cured and processed meat, or chicken with skin. ? Avoid pre-made or processed food.  Eat less than 1,500 mg of salt each day.  Do not drink alcohol if: ? Your doctor tells you not to drink. ? You are pregnant, may be pregnant, or are planning to become pregnant.  If you drink alcohol: ? Limit how much you use to:  0-1 drink a day for women.  0-2 drinks a day for men. ? Be aware of how much alcohol is in your drink. In the [place] one drink equals one 12 oz bottle of beer (355 mL), one 5 oz glass of wine (148 mL), or one 1 oz glass of hard liquor (44 mL). Lifestyle  Work with your doctor to stay at a healthy weight or to lose weight. Ask your doctor what the best weight is for you.  Get at least 30 minutes of exercise most days of the week. This may include walking, swimming, or biking.  Get at least 30 minutes of exercise that strengthens your muscles (resistance exercise) at least 3 days a week. This may include lifting weights or doing Pilates.  Do not use any products that contain nicotine or tobacco, such as cigarettes, e-cigarettes, and chewing tobacco. If  you need help quitting, ask your doctor.  Check your blood pressure at home as told by your doctor.  Keep all follow-up visits as told by your doctor. This is important. Medicines  Take over-the-counter and prescription medicines only as told by your doctor. Follow directions carefully.  Do not skip doses of blood pressure medicine. The medicine does not work as well if you skip doses. Skipping doses also puts you at risk for problems.  Ask your doctor about side effects or reactions to medicines that you should watch for. Contact a doctor if you:  Think you are having a reaction to the medicine you are taking.  Have headaches that keep coming back (recurring).  Feel dizzy.  Have swelling in your ankles.  Have trouble with your vision. Get help right away if you:  Get a very bad headache.  Start to feel mixed up (confused).  Feel weak or numb.  Feel faint.  Have very bad pain in your: ? Chest. ? Belly (abdomen).  Throw up more than once.  Have trouble breathing. Summary  Hypertension is another name for high blood pressure.  High blood pressure forces your heart to work harder to pump blood.  For most people, a normal blood pressure is less than 120/80.  Making healthy choices can help lower blood pressure. If your blood pressure does not get lower with healthy choices, you may need to take medicine. This information is not intended to replace advice given to you by your health care provider. Make sure you discuss any questions you have with your health care provider. Document Revised: 04/15/2018 Document Reviewed: 04/15/2018 Elsevier Patient Education  2020 [employer].

## 2020-01-10 NOTE — Progress Notes (Signed)
Established Patient Office Visit  Subjective:  Patient ID: Anna Carney, female    DOB: Sep 01, 1950  Age: 69 y.o. MRN: [id number]  CC:  Chief Complaint  Patient presents with  . Hypertension    2 week follow up     HPI [name] presents for follow up of hypertension. Patient was diagnosed in 2020. The patient is tolerating the medication well without side effects. Compliance with treatment has been good; including taking medication as directed , maintains a healthy diet and regular exercise regimen , and following up as directed.  Past Medical History:  Diagnosis Date  . Anxiety   . Arthritis   . Cancer ([place]) 10/2018   left breast IDC  . Hypertension   . Shingles     Past Surgical History:  Procedure Laterality Date  . CHOLECYSTECTOMY    . MASTECTOMY W/ SENTINEL NODE BIOPSY Left 11/12/2018   Procedure: LEFT MASTECTOMY WITH LEFT AXILLARY SENTINEL LYMPH NODE BIOPSY;  Surgeon: Rolm Bookbinder, MD;  Location: [facility];  Service: General;  Laterality: Left;    Family History  Problem Relation Age of Onset  . Arthritis Mother   . Diabetes Mother   . Stroke Mother   . Asthma Father   . Colon cancer Brother   . Arthritis Daughter   . Thyroid disease Daughter   . Breast cancer Maternal Aunt     Social History   Socioeconomic History  . Marital status: Single    Spouse name: Not on file  . Number of children: Not on file  . Years of education: Not on file  . Highest education level: Not on file  Occupational History  . Occupation: [employer]    Comment: in garden center  . Occupation: [employer]    Comment: cafeteria and bus driver  Tobacco Use  . Smoking status: Former Research scientist (life sciences)  . Smokeless tobacco: Never Used  Substance and Sexual Activity  . Alcohol use: Yes    Comment: rarely  . Drug use: No  . Sexual activity: Not Currently    Birth control/protection: Post-menopausal  Other Topics Concern  . Not on  file  Social History Narrative  . Not on file   Social Determinants of Health   Financial Resource Strain:   . Difficulty of Paying Living Expenses:   Food Insecurity:   . Worried About Charity fundraiser in the Last Year:   . Arboriculturist in the Last Year:   Transportation Needs:   . Film/video editor (Medical):   [name] Lack of Transportation (Non-Medical):   Physical Activity:   . Days of Exercise per Week:   . Minutes of Exercise per Session:   Stress:   . Feeling of Stress :   Social Connections:   . Frequency of Communication with Friends and Family:   . Frequency of Social Gatherings with Friends and Family:   . Attends Religious Services:   . Active Member of Clubs or Organizations:   . Attends Archivist Meetings:   [name] Marital Status:   Intimate Partner Violence:   . Fear of Current or Ex-Partner:   . Emotionally Abused:   [name] Physically Abused:   . Sexually Abused:     Outpatient Medications Prior to Visit  Medication Sig Dispense Refill  . acetaminophen (TYLENOL) 325 MG tablet Take 650 mg by mouth every 6 (six) hours as needed.    [name] anastrozole (ARIMIDEX) 1 MG tablet Take 1 tablet  by mouth once daily 30 tablet 0  . Ascorbic Acid (VITAMIN C) 1000 MG tablet Take 2,000 mg by mouth daily.    . calcium carbonate (OS-CAL) 1250 (500 Ca) MG chewable tablet Chew 1 tablet by mouth daily.    . celecoxib (CELEBREX) 200 MG capsule Take 200 mg by mouth daily.    . cholecalciferol (VITAMIN D3) 25 MCG (1000 UT) tablet Take 2,000 Units by mouth daily.    . diclofenac Sodium (VOLTAREN) 1 % GEL Apply 2 g topically 4 (four) times daily.    [name] Calcium (STOOL SOFTENER PO) Take by mouth.    [name] lisinopril-hydrochlorothiazide (ZESTORETIC) 10-12.5 MG tablet Take 1 tablet by mouth daily. 30 tablet 0  . Multiple Vitamin (MULTIVITAMIN) tablet Take 1 tablet by mouth daily.    . prochlorperazine (COMPAZINE) 10 MG tablet TAKE 1 TABLET BY MOUTH EVERY 8 HOURS AS NEEDED FOR  NAUSEA AND VOMITING 30 tablet 6   No facility-administered medications prior to visit.    Allergies  Allergen Reactions  . Sulfa Antibiotics Nausea Only    ROS Review of Systems  Constitutional: Negative for activity change and appetite change.  HENT: Negative.   Eyes: Negative.   Respiratory: Negative.   Cardiovascular: Negative.   Gastrointestinal: Negative for abdominal pain.  Endocrine: Negative.   Genitourinary: Negative for difficulty urinating.  Musculoskeletal: Negative for arthralgias and myalgias.  Skin: Negative for color change, pallor and rash.  Psychiatric/Behavioral: The patient is not nervous/anxious.       Objective:    Physical Exam  Constitutional: She is oriented to person, place, and time. She appears well-developed and well-nourished.  HENT:  Mouth/Throat: Oropharynx is clear and moist.  Eyes: Conjunctivae are normal.  Cardiovascular: Normal rate and regular rhythm.  Pulmonary/Chest: Breath sounds normal.  Abdominal: Bowel sounds are normal.  Musculoskeletal:        General: No tenderness.     Cervical back: Neck supple.  Neurological: She is alert and oriented to person, place, and time.  Skin: No rash noted. No erythema.  Psychiatric: She has a normal mood and affect. Her behavior is normal.    BP 122/80   Pulse 72   Temp 98 F (36.7 C) (Temporal)   Ht 5\' 4"  (1.626 m)   Wt 219 lb 9.6 oz (99.6 kg)   SpO2 97%   BMI 37.69 kg/m  Wt Readings from Last 3 Encounters:  01/10/20 219 lb 9.6 oz (99.6 kg)  12/27/19 221 lb 4 oz (100.4 kg)  09/20/19 217 lb 6.4 oz (98.6 kg)     Health Maintenance Due  Topic Date Due  . Hepatitis C Screening  Never done  . COVID-19 Vaccine (1) Never done  . COLONOSCOPY  Never done     Lab Results  Component Value Date   TSH 0.416 (L) 10/21/2018   Lab Results  Component Value Date   WBC 5.9 09/13/2019   HGB 13.4 09/13/2019   HCT 40.0 09/13/2019   MCV 94.8 09/13/2019   PLT 170 09/13/2019   Lab  Results  Component Value Date   NA 138 09/13/2019   K 3.6 09/13/2019   CO2 29 09/13/2019   GLUCOSE 97 09/13/2019   BUN 24 (H) 09/13/2019   CREATININE 0.64 09/13/2019   BILITOT 0.5 09/13/2019   ALKPHOS 67 09/13/2019   AST 25 09/13/2019   ALT 23 09/13/2019   PROT 7.0 09/13/2019   ALBUMIN 3.9 09/13/2019   CALCIUM 9.4 09/13/2019   ANIONGAP 11 09/13/2019  Lab Results  Component Value Date   HGBA1C 5.1 10/21/2018      Assessment & Plan:  Essential hypertension Hypertension is well controlled after 2 weeks follow up. No changes to current medication. Patient will follow up in 3 months. Continue to eat a healthy low salt diet and exercise.  Problem List Items Addressed This Visit      Cardiovascular and Mediastinum   Essential hypertension - Primary    Hypertension is well controlled after 2 weeks follow up. No changes to current medication. Patient will follow up in 3 months. Continue to eat a healthy low salt diet and exercise.          Follow-up: Return in about 3 months (around 04/11/2020) for Blood pressure checks.Ivy Lynn, NP

## 2020-01-19 ENCOUNTER — Other Ambulatory Visit: Payer: Self-pay

## 2020-01-19 ENCOUNTER — Inpatient Hospital Stay (HOSPITAL_COMMUNITY): Payer: BC Managed Care – PPO | Attending: Hematology

## 2020-01-19 DIAGNOSIS — N951 Menopausal and female climacteric states: Secondary | ICD-10-CM | POA: Diagnosis not present

## 2020-01-19 DIAGNOSIS — C50512 Malignant neoplasm of lower-outer quadrant of left female breast: Secondary | ICD-10-CM | POA: Diagnosis not present

## 2020-01-19 DIAGNOSIS — Z79818 Long term (current) use of other agents affecting estrogen receptors and estrogen levels: Secondary | ICD-10-CM

## 2020-01-19 DIAGNOSIS — Z170 Estrogen receptor positive status [ER+]: Secondary | ICD-10-CM | POA: Diagnosis not present

## 2020-01-19 DIAGNOSIS — M8580 Other specified disorders of bone density and structure, unspecified site: Secondary | ICD-10-CM | POA: Diagnosis not present

## 2020-01-19 LAB — CBC WITH DIFFERENTIAL/PLATELET
Abs Immature Granulocytes: 0.02 10*3/uL (ref 0.00–0.07)
Basophils Absolute: 0 10*3/uL (ref 0.0–0.1)
Basophils Relative: 1 %
Eosinophils Absolute: 0.2 10*3/uL (ref 0.0–0.5)
Eosinophils Relative: 3 %
HCT: 40.3 % (ref 36.0–46.0)
Hemoglobin: 13.3 g/dL (ref 12.0–15.0)
Immature Granulocytes: 0 %
Lymphocytes Relative: 21 %
Lymphs Abs: 1.1 10*3/uL (ref 0.7–4.0)
MCH: 31.7 pg (ref 26.0–34.0)
MCHC: 33 g/dL (ref 30.0–36.0)
MCV: 96.2 fL (ref 80.0–100.0)
Monocytes Absolute: 0.5 10*3/uL (ref 0.1–1.0)
Monocytes Relative: 9 %
Neutro Abs: 3.6 10*3/uL (ref 1.7–7.7)
Neutrophils Relative %: 66 %
Platelets: 158 10*3/uL (ref 150–400)
RBC: 4.19 MIL/uL (ref 3.87–5.11)
RDW: 12.6 % (ref 11.5–15.5)
WBC: 5.3 10*3/uL (ref 4.0–10.5)
nRBC: 0 % (ref 0.0–0.2)

## 2020-01-19 LAB — COMPREHENSIVE METABOLIC PANEL
ALT: 21 U/L (ref 0–44)
AST: 27 U/L (ref 15–41)
Albumin: 3.9 g/dL (ref 3.5–5.0)
Alkaline Phosphatase: 60 U/L (ref 38–126)
Anion gap: 8 (ref 5–15)
BUN: 19 mg/dL (ref 8–23)
CO2: 29 mmol/L (ref 22–32)
Calcium: 9.4 mg/dL (ref 8.9–10.3)
Chloride: 101 mmol/L (ref 98–111)
Creatinine, Ser: 0.64 mg/dL (ref 0.44–1.00)
GFR calc Af Amer: >60 mL/min (ref >60)
GFR calc non Af Amer: >60 mL/min (ref >60)
Glucose, Bld: 102 mg/dL — ABNORMAL HIGH (ref 70–99)
Potassium: 3.7 mmol/L (ref 3.5–5.1)
Sodium: 138 mmol/L (ref 135–145)
Total Bilirubin: 0.9 mg/dL (ref 0.3–1.2)
Total Protein: 7.2 g/dL (ref 6.5–8.1)

## 2020-01-19 LAB — VITAMIN D 25 HYDROXY (VIT D DEFICIENCY, FRACTURES): Vit D, 25-Hydroxy: 97.34 ng/mL (ref 30–100)

## 2020-01-21 ENCOUNTER — Other Ambulatory Visit (HOSPITAL_COMMUNITY): Payer: Self-pay | Admitting: Nurse Practitioner

## 2020-01-21 DIAGNOSIS — C50512 Malignant neoplasm of lower-outer quadrant of left female breast: Secondary | ICD-10-CM

## 2020-01-25 ENCOUNTER — Other Ambulatory Visit: Payer: Self-pay | Admitting: *Deleted

## 2020-01-25 DIAGNOSIS — I10 Essential (primary) hypertension: Secondary | ICD-10-CM

## 2020-01-25 MED ORDER — LISINOPRIL-HYDROCHLOROTHIAZIDE 10-12.5 MG PO TABS: 1.0000 | ORAL_TABLET | Freq: Every day | ORAL | 1 refills | Status: DC

## 2020-01-26 ENCOUNTER — Inpatient Hospital Stay (HOSPITAL_BASED_OUTPATIENT_CLINIC_OR_DEPARTMENT_OTHER): Payer: BC Managed Care – PPO | Admitting: Hematology

## 2020-01-26 VITALS — BP 126/49 | HR 81 | Temp 98.0°F | Resp 18 | Wt 218.4 lb

## 2020-01-26 DIAGNOSIS — C50512 Malignant neoplasm of lower-outer quadrant of left female breast: Secondary | ICD-10-CM | POA: Diagnosis not present

## 2020-01-26 DIAGNOSIS — Z170 Estrogen receptor positive status [ER+]: Secondary | ICD-10-CM

## 2020-01-26 NOTE — Patient Instructions (Signed)
[place] at [facility] Discharge Instructions  You were seen today by Dr. Delton Coombes. He went over your recent results. You can try taking black cohosh over the counter for your hot flashes. Dr. Delton Coombes will see you back in 4 months for labs and follow up.   Thank you for choosing [place] at [facility] to provide your oncology and hematology care.  To afford each patient quality time with our provider, please arrive at least 15 minutes before your scheduled appointment time.   If you have a lab appointment with the [facility] please come in thru the Main Entrance and check in at the main information desk  You need to re-schedule your appointment should you arrive 10 or more minutes late.  We strive to give you quality time with our providers, and arriving late affects you and other patients whose appointments are after yours.  Also, if you no show three or more times for appointments you may be dismissed from the clinic at the providers discretion.     Again, thank you for choosing [facility].  Our hope is that these requests will decrease the amount of time that you wait before being seen by our physicians.       _____________________________________________________________  Should you have questions after your visit to [facility], please contact our office at [phone] between the hours of 8:00 a.m. and 4:30 p.m.  Voicemails left after 4:00 p.m. will not be returned until the following business day.  For prescription refill requests, have your pharmacy contact our office and allow 72 hours.    Cancer Center Support Programs:   > Cancer Support Group  2nd Tuesday of the month 1pm-2pm, Journey Room

## 2020-01-26 NOTE — Progress Notes (Signed)
Anna Carney, [place] [address]   CLINIC:  Medical Oncology/Hematology  PCP:  Janora Norlander, DO [address] [place] [address] [phone]   REASON FOR VISIT:  Follow-up for breast cancer  PRIOR THERAPY: Left mastectomy and lymph node dissection on 11/12/2018.  NGS Results: Oncotype DX recurrence score 2.  CURRENT THERAPY: Anastrozole.  BRIEF ONCOLOGIC HISTORY:  Oncology History  Malignant neoplasm of lower-outer quadrant of left breast of female, estrogen receptor positive ([place])  10/27/2018 Initial Diagnosis   Malignant neoplasm of lower-outer quadrant of left breast of female, estrogen receptor positive ([place])   12/10/2018 Cancer Staging   Staging form: Breast, AJCC 8th Edition - Clinical stage from 12/10/2018: Stage IIIB (cT4b, cN0, cM0, G2, ER+, PR+, HER2-, Oncotype DX score: 2) - Signed by Derek Jack, MD on 12/10/2018     CANCER STAGING: Cancer Staging Malignant neoplasm of lower-outer quadrant of left breast of female, estrogen receptor positive ([place]) Staging form: Breast, AJCC 8th Edition - Clinical stage from 12/10/2018: Stage IIIB (cT4b, cN0, cM0, G2, ER+, PR+, HER2-, Oncotype DX score: 2) - Signed by Derek Jack, MD on 12/10/2018 - Pathologic: Stage IIIA (pT4b, pN0, cM0, G2, ER+, PR+, HER2-, Oncotype DX score: 2) - Unsigned   INTERVAL HISTORY:  [name], a 69 y.o. female, returns for routine follow-up of her breast cancer. Ivis was last seen on 09/20/2019.  Today she reports having hot flashes mostly during the night, though the ones during the day are worse. She has trouble staying asleep. She reports having arthritic pain, but no new pains. She is taking calcium and vitamin D daily.   REVIEW OF SYSTEMS:  Review of Systems  Constitutional: Positive for appetite change (moderately decreased) and fatigue (mild).  Cardiovascular: Positive for leg swelling.  Gastrointestinal: Positive  for constipation.  Neurological: Positive for numbness.  All other systems reviewed and are negative.   PAST MEDICAL/SURGICAL HISTORY:  Past Medical History:  Diagnosis Date   Anxiety    Arthritis    Cancer ([place]) 10/2018   left breast IDC   Hypertension    Shingles    Past Surgical History:  Procedure Laterality Date   CHOLECYSTECTOMY     MASTECTOMY W/ SENTINEL NODE BIOPSY Left 11/12/2018   Procedure: LEFT MASTECTOMY WITH LEFT AXILLARY SENTINEL LYMPH NODE BIOPSY;  Surgeon: Rolm Bookbinder, MD;  Location: [place];  Service: General;  Laterality: Left;    SOCIAL HISTORY:  Social History   Socioeconomic History   Marital status: Single    Spouse name: Not on file   Number of children: Not on file   Years of education: Not on file   Highest education level: Not on file  Occupational History   Occupation: [employer]    Comment: in garden center   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  Tobacco Use   Smoking status: Former Smoker   Smokeless tobacco: Never Used  Substance and Sexual Activity   Alcohol use: Yes    Comment: rarely   Drug use: No   Sexual activity: Not Currently    Birth control/protection: Post-menopausal  Other Topics Concern   Not on file  Social History Narrative   Not on file   Social Determinants of Health   Financial Resource Strain:    Difficulty of Paying Living Expenses:   Food Insecurity:    Worried About [place] in the Last  Year:    Arboriculturist in the Last Year:   Transportation Needs:    Film/video editor (Medical):    Lack of Transportation (Non-Medical):   Physical Activity:    Days of Exercise per Week:    Minutes of Exercise per Session:   Stress:    Feeling of Stress :   Social Connections:    Frequency of Communication with Friends and Family:    Frequency of Social Gatherings with Friends and Family:     Attends Religious Services:    Active Member of Clubs or Organizations:    Attends Music therapist:    Marital Status:   Intimate Partner Violence:    Fear of Current or Ex-Partner:    Emotionally Abused:    Physically Abused:    Sexually Abused:     FAMILY HISTORY:  Family History  Problem Relation Age of Onset   Arthritis Mother    Diabetes Mother    Stroke Mother    Asthma Father    Colon cancer Brother    Arthritis Daughter    Thyroid disease Daughter    Breast cancer Maternal Aunt     CURRENT MEDICATIONS:  Current Outpatient Medications  Medication Sig Dispense Refill   acetaminophen (TYLENOL) 325 MG tablet Take 650 mg by mouth every 6 (six) hours as needed.     anastrozole (ARIMIDEX) 1 MG tablet Take 1 tablet by mouth once daily 30 tablet 0   Ascorbic Acid (VITAMIN C) 1000 MG tablet Take 2,000 mg by mouth daily.     calcium carbonate (OS-CAL) 1250 (500 Ca) MG chewable tablet Chew 1 tablet by mouth daily.     celecoxib (CELEBREX) 200 MG capsule Take 200 mg by mouth daily.     cholecalciferol (VITAMIN D3) 25 MCG (1000 UT) tablet Take 2,000 Units by mouth daily.     diclofenac Sodium (VOLTAREN) 1 % GEL Apply 2 g topically 4 (four) times daily.     Docusate Calcium (STOOL SOFTENER PO) Take by mouth.     lisinopril-hydrochlorothiazide (ZESTORETIC) 10-12.5 MG tablet Take 1 tablet by mouth daily. 90 tablet 1   Multiple Vitamin (MULTIVITAMIN) tablet Take 1 tablet by mouth daily.     prochlorperazine (COMPAZINE) 10 MG tablet TAKE 1 TABLET BY MOUTH EVERY 8 HOURS AS NEEDED FOR NAUSEA AND VOMITING 30 tablet 6   No current facility-administered medications for this visit.    ALLERGIES:  Allergies  Allergen Reactions   Sulfa Antibiotics Nausea Only    PHYSICAL EXAM:  Performance status (ECOG): 1 - Symptomatic but completely ambulatory  There were no vitals filed for this visit. Wt Readings from Last 3 Encounters:  01/10/20 219 lb  9.6 oz (99.6 kg)  12/27/19 221 lb 4 oz (100.4 kg)  09/20/19 217 lb 6.4 oz (98.6 kg)   Physical Exam Vitals reviewed.  Constitutional:      Appearance: Normal appearance.  Cardiovascular:     Rate and Rhythm: Normal rate and regular rhythm.     Pulses: Normal pulses.     Heart sounds: Normal heart sounds.  Pulmonary:     Effort: Pulmonary effort is normal.     Breath sounds: Normal breath sounds.  Chest:     Breasts:        Right: Normal. No mass.        Left: Absent.  Abdominal:     Palpations: Abdomen is soft. There is no mass.     Tenderness: There is no abdominal  tenderness.  Musculoskeletal:     Right lower leg: No edema.     Left lower leg: No edema.  Lymphadenopathy:     Upper Body:     Right upper body: No supraclavicular or axillary adenopathy.     Left upper body: No supraclavicular or axillary adenopathy.  Neurological:     General: No focal deficit present.     Mental Status: She is alert and oriented to person, place, and time.  Psychiatric:        Mood and Affect: Mood normal.        Behavior: Behavior normal.      LABORATORY DATA:  I have reviewed the labs as listed.  CBC Latest Ref Rng & Units 01/19/2020 09/13/2019 05/19/2019  WBC 4.0 - 10.5 K/uL 5.3 5.9 6.1  Hemoglobin 12.0 - 15.0 g/dL 13.3 13.4 13.9  Hematocrit 36.0 - 46.0 % 40.3 40.0 42.1  Platelets 150 - 400 K/uL 158 170 184   CMP Latest Ref Rng & Units 01/19/2020 09/13/2019 05/19/2019  Glucose 70 - 99 mg/dL 102(H) 97 101(H)  BUN 8 - 23 mg/dL 19 24(H) 18  Creatinine 0.44 - 1.00 mg/dL 0.64 0.64 0.68  Sodium 135 - 145 mmol/L 138 138 139  Potassium 3.5 - 5.1 mmol/L 3.7 3.6 3.4(L)  Chloride 98 - 111 mmol/L 101 98 100  CO2 22 - 32 mmol/L '29 29 27  ' Calcium 8.9 - 10.3 mg/dL 9.4 9.4 9.6  Total Protein 6.5 - 8.1 g/dL 7.2 7.0 7.7  Total Bilirubin 0.3 - 1.2 mg/dL 0.9 0.5 0.9  Alkaline Phos 38 - 126 U/L 60 67 78  AST 15 - 41 U/L '27 25 28  ' ALT 0 - 44 U/L '21 23 22    ' DIAGNOSTIC IMAGING:  I have  independently reviewed the scans and discussed with the patient.   ASSESSMENT:  1.  Stage IIIb (T4N0) left breast IDC, ER/PR positive, HER-2 negative: -Biopsy of the left breast on 10/20/2018 showed IDC.  Left axillary core biopsy was negative. -Left mastectomy on 11/12/2018, 11 cm IDC, grade 2, margins negative, 0/14 lymph node positive, ER/PR positive, HER-2 2+ by IHC and negative by FISH, Ki-67 10%. -Oncotype DX recurrence score of 2. -Anastrozole started on 12/10/2018. -XRT from 02/01/2019-03/18/2019. -Right breast mammogram on 12/16/2019 shows BI-RADS 1.  2.  Osteopenia: -DEXA scan on 02/09/2019 shows T score of -2. 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    PLAN:  1.  Stage IIIb (T4N0) left breast IDC, ER/PR positive, HER-2 negative: -Physical exam today shows left mastectomy site is within normal limits.  Right breast has no palpable masses. -Continue anastrozole.  I have reviewed her labs which showed normal LFTs and CBC. -I reviewed mammogram results.  We will see her back in 4 months with repeat labs.  2.  Hot flashes: -She did not want to take Effexor or for hot flashes. -She has hot flashes mostly at nighttime but the ones that come during the daytime are bothering her more.  I have suggested try black cohosh.  3.  Osteopenia: -Vitamin D level was normal. -Continue calcium and vitamin D.  Repeat DEXA in 2 years.    Orders placed this encounter:  No orders of the defined types were placed in this encounter.    Derek Jack, MD [place] [phone]   I, Milinda Antis, am acting as a scribe for Dr. Sanda Linger.  I, Derek Jack MD, have reviewed the above documentation for accuracy and completeness,  and I agree with the above.

## 2020-02-23 ENCOUNTER — Other Ambulatory Visit (HOSPITAL_COMMUNITY): Payer: Self-pay | Admitting: Nurse Practitioner

## 2020-02-23 DIAGNOSIS — Z170 Estrogen receptor positive status [ER+]: Secondary | ICD-10-CM

## 2020-03-22 ENCOUNTER — Other Ambulatory Visit (HOSPITAL_COMMUNITY): Payer: Self-pay | Admitting: Nurse Practitioner

## 2020-03-22 DIAGNOSIS — C50512 Malignant neoplasm of lower-outer quadrant of left female breast: Secondary | ICD-10-CM

## 2020-04-03 ENCOUNTER — Ambulatory Visit: Payer: BC Managed Care – PPO | Admitting: Nurse Practitioner

## 2020-04-03 ENCOUNTER — Ambulatory Visit: Payer: BC Managed Care – PPO | Admitting: Family Medicine

## 2020-04-03 ENCOUNTER — Other Ambulatory Visit: Payer: Self-pay

## 2020-04-03 ENCOUNTER — Encounter: Payer: Self-pay | Admitting: Nurse Practitioner

## 2020-04-03 VITALS — BP 112/68 | HR 73 | Temp 98.0°F | Ht 64.0 in | Wt 216.8 lb

## 2020-04-03 DIAGNOSIS — I10 Essential (primary) hypertension: Secondary | ICD-10-CM

## 2020-04-03 MED ORDER — LOSARTAN POTASSIUM 25 MG PO TABS: 25.0000 mg | ORAL_TABLET | Freq: Every day | ORAL | 1 refills | Status: DC

## 2020-04-03 NOTE — Progress Notes (Signed)
Established Patient Office Visit  Subjective:  Patient ID: Anna Carney, female    DOB: April 18, 1951  Age: 69 y.o. MRN: [id number]  CC:  Chief Complaint  Patient presents with  . Hypertension  . Cough    since starting the Lisinopril     HPI [name] presents for follow up of hypertension. Patient was diagnosed in ____. The patient is tolerating the medication well without side effects. Compliance with treatment has been good; including taking medication as directed , maintains a healthy diet and regular exercise regimen , and following up as directed.  Past Medical History:  Diagnosis Date  . Anxiety   . Arthritis   . 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 Hypertension   . Shingles     Past Surgical History:  Procedure Laterality Date  . CHOLECYSTECTOMY    . MASTECTOMY W/ SENTINEL NODE BIOPSY Left 11/12/2018   Procedure: LEFT MASTECTOMY WITH LEFT AXILLARY SENTINEL LYMPH NODE BIOPSY;  Surgeon: Rolm Bookbinder, MD;  Location: [facility];  Service: General;  Laterality: Left;    Family History  Problem Relation Age of Onset  . Arthritis Mother   . Diabetes Mother   . Stroke Mother   . Asthma Father   . Colon cancer Brother   . Arthritis Daughter   . Thyroid disease Daughter   . Breast cancer Maternal Aunt     Social History   Socioeconomic History  . Marital status: Single    Spouse name: Not on file  . Number of children: Not on file  . Years of education: Not on file  . Highest education level: Not on file  Occupational History  . Occupation: [employer]    Comment: in garden center  . Occupation: [employer]    Comment: cafeteria and bus driver  Tobacco Use  . Smoking status: Former Research scientist (life sciences)  . Smokeless tobacco: Never Used  Vaping Use  . Vaping Use: Never used  Substance and Sexual Activity  . Alcohol use: Yes    Comment: rarely  . Drug use: No  . Sexual activity: Not Currently    Birth  control/protection: Post-menopausal  Other Topics Concern  . Not on file  Social History Narrative  . Not on file   Social Determinants of Health   Financial Resource Strain:   . Difficulty of Paying Living Expenses:   Food Insecurity:   . Worried About Charity fundraiser in the Last Year:   . Arboriculturist in the Last Year:   Transportation Needs:   . Film/video editor (Medical):   [name] Lack of Transportation (Non-Medical):   Physical Activity:   . Days of Exercise per Week:   . Minutes of Exercise per Session:   Stress:   . Feeling of Stress :   Social Connections:   . Frequency of Communication with Friends and Family:   . Frequency of Social Gatherings with Friends and Family:   . Attends Religious Services:   . Active Member of Clubs or Organizations:   . Attends Archivist Meetings:   [name] Marital Status:   Intimate Partner Violence:   . Fear of Current or Ex-Partner:   . Emotionally Abused:   [name] Physically Abused:   . Sexually Abused:     Outpatient Medications Prior to Visit  Medication Sig Dispense Refill  . acetaminophen (TYLENOL) 325 MG tablet Take 650 mg by mouth every 6 (six) hours as needed.    [name]  anastrozole (ARIMIDEX) 1 MG tablet Take 1 tablet by mouth once daily 30 tablet 0  . Ascorbic Acid (VITAMIN C) 1000 MG tablet Take 2,000 mg by mouth daily.    . calcium carbonate (OS-CAL) 1250 (500 Ca) MG chewable tablet Chew 1 tablet by mouth 2 (two) times daily.     . celecoxib (CELEBREX) 200 MG capsule Take 200 mg by mouth daily.    . cholecalciferol (VITAMIN D3) 25 MCG (1000 UT) tablet Take 2,000 Units by mouth daily.    . diclofenac Sodium (VOLTAREN) 1 % GEL Apply 2 g topically 4 (four) times daily.    [name] Calcium (STOOL SOFTENER PO) Take by mouth.    [name] lisinopril-hydrochlorothiazide (ZESTORETIC) 10-12.5 MG tablet Take 1 tablet by mouth daily. 90 tablet 1  . Multiple Vitamin (MULTIVITAMIN) tablet Take 1 tablet by mouth daily.    .  prochlorperazine (COMPAZINE) 10 MG tablet TAKE 1 TABLET BY MOUTH EVERY 8 HOURS AS NEEDED FOR NAUSEA AND VOMITING (Patient not taking: Reported on 01/26/2020) 30 tablet 6   No facility-administered medications prior to visit.    Allergies  Allergen Reactions  . Sulfa Antibiotics Nausea Only    ROS Review of Systems  Constitutional: Negative.   HENT: Negative.   Eyes: Negative.   Respiratory: Positive for cough.   Cardiovascular: Negative.   Musculoskeletal: Negative.   Skin: Negative.   Neurological: Negative.   Psychiatric/Behavioral: Negative.       Objective:    Physical Exam Vitals reviewed.  HENT:     Head: Normocephalic.  Eyes:     Conjunctiva/sclera: Conjunctivae normal.  Cardiovascular:     Rate and Rhythm: Normal rate.     Pulses: Normal pulses.     Heart sounds: Normal heart sounds.  Pulmonary:     Effort: Pulmonary effort is normal.     Breath sounds: Normal breath sounds.     Comments: Cough Abdominal:     General: Bowel sounds are normal.  Musculoskeletal:        General: No tenderness.  Skin:    General: Skin is warm.  Neurological:     Mental Status: She is alert and oriented to person, place, and time.  Psychiatric:        Mood and Affect: Mood normal.     BP 112/68   Pulse 73   Temp 98 F (36.7 C) (Temporal)   Ht 5\' 4"  (1.626 m)   Wt 216 lb 12.8 oz (98.3 kg)   SpO2 96%   BMI 37.21 kg/m  Wt Readings from Last 3 Encounters:  01/26/20 218 lb 6.4 oz (99.1 kg)  01/10/20 219 lb 9.6 oz (99.6 kg)  12/27/19 221 lb 4 oz (100.4 kg)     Health Maintenance Due  Topic Date Due  . Hepatitis C Screening  Never done  . COVID-19 Vaccine (1) Never done  . COLONOSCOPY  Never done  . INFLUENZA VACCINE  03/19/2020      Lab Results  Component Value Date   TSH 0.416 (L) 10/21/2018   Lab Results  Component Value Date   WBC 5.3 01/19/2020   HGB 13.3 01/19/2020   HCT 40.3 01/19/2020   MCV 96.2 01/19/2020   PLT 158 01/19/2020   Lab Results    Component Value Date   NA 138 01/19/2020   K 3.7 01/19/2020   CO2 29 01/19/2020   GLUCOSE 102 (H) 01/19/2020   BUN 19 01/19/2020   CREATININE 0.64 01/19/2020   BILITOT 0.9 01/19/2020  ALKPHOS 60 01/19/2020   AST 27 01/19/2020   ALT 21 01/19/2020   PROT 7.2 01/19/2020   ALBUMIN 3.9 01/19/2020   CALCIUM 9.4 01/19/2020   ANIONGAP 8 01/19/2020   Lab Results  Component Value Date   HGBA1C 5.1 10/21/2018      Assessment & Plan:  Essential hypertension Patient is a 69 year old female who is following up for hypertension. Patient was diagnosed in _5/05/2020.The patient is tolerating the medication well but reports new side effect with persistent cough. Compliance with treatment has been good; including taking medication as directed , maintains a healthy diet and regular exercise regimen , and following up as directed.  Current medication lisinopril-hydrochlorothiazide 10-12.5 mg daily medication discontinued.  Patient started on losartan 25 mg daily.   Provided education to patient with printed handouts given.  Follow-up in 2 weeks.  Problem List Items Addressed This Visit      Cardiovascular and Mediastinum               Meds ordered this encounter  Medications  . losartan (COZAAR) 25 MG tablet    Sig: Take 1 tablet (25 mg total) by mouth daily.    Dispense:  60 tablet    Refill:  1    Order Specific Question:   Supervising Provider    Answer:   Caryl Pina A [[id number]]    Follow-up: Return if symptoms worsen or fail to improve.    Ivy Lynn, NP

## 2020-04-03 NOTE — Assessment & Plan Note (Addendum)
Patient is a 69 year old female who is following up for hypertension. Patient was diagnosed in _5/05/2020.The patient is tolerating the medication well but reports new side effect with persistent cough. Compliance with treatment has been good; including taking medication as directed , maintains a healthy diet and regular exercise regimen , and following up as directed.  Current medication lisinopril-hydrochlorothiazide 10-12.5 mg daily medication discontinued.  Patient started on losartan 25 mg daily.   Provided education to patient with printed handouts given.  Follow-up in 2 weeks.

## 2020-04-03 NOTE — Patient Instructions (Signed)

## 2020-04-17 ENCOUNTER — Ambulatory Visit: Payer: BC Managed Care – PPO | Admitting: Nurse Practitioner

## 2020-04-21 ENCOUNTER — Other Ambulatory Visit (HOSPITAL_COMMUNITY): Payer: Self-pay | Admitting: Nurse Practitioner

## 2020-04-21 DIAGNOSIS — Z170 Estrogen receptor positive status [ER+]: Secondary | ICD-10-CM

## 2020-04-27 ENCOUNTER — Encounter: Payer: Self-pay | Admitting: *Deleted

## 2020-04-27 ENCOUNTER — Encounter: Payer: Self-pay | Admitting: Nurse Practitioner

## 2020-04-27 ENCOUNTER — Other Ambulatory Visit: Payer: Self-pay

## 2020-04-27 ENCOUNTER — Ambulatory Visit: Payer: BC Managed Care – PPO | Admitting: Nurse Practitioner

## 2020-04-27 VITALS — BP 135/70 | HR 64 | Temp 97.7°F | Resp 20 | Ht 64.0 in | Wt 220.0 lb

## 2020-04-27 DIAGNOSIS — R600 Localized edema: Secondary | ICD-10-CM | POA: Insufficient documentation

## 2020-04-27 DIAGNOSIS — I10 Essential (primary) hypertension: Secondary | ICD-10-CM

## 2020-04-27 DIAGNOSIS — R5382 Chronic fatigue, unspecified: Secondary | ICD-10-CM | POA: Diagnosis not present

## 2020-04-27 MED ORDER — LOSARTAN POTASSIUM 50 MG PO TABS
50.0000 mg | ORAL_TABLET | Freq: Every day | ORAL | 2 refills | Status: DC
Start: 2020-04-27 — End: 2020-07-24

## 2020-04-27 NOTE — Assessment & Plan Note (Signed)
Trace bilateral lower extremity edema.  This is not new for patient but intermittent in the last few weeks.  Patient denies pain.  No changes to skin integrity or color.  Skin is not warm to touch.  Advised patient to elevate feet, ordered compression stockings. Provided education with printed handouts given.  Rx sent to pharmacy  Follow-up with worsening symptoms of edema.

## 2020-04-27 NOTE — Assessment & Plan Note (Addendum)
Patient is following up today for hypertension.  Patient is tolerating medication well without side effects.  Current medication is losartan 25 mg tablet by mouth daily.  Compliance with treatment has been good.  Patient systolic blood pressure still slightly elevated.  Change losartan to 50 mg daily.  Patient will follow up with blood pressure log in 1 week. Okay Continue healthy diet and exercise.  Rx sent to pharmacy

## 2020-04-27 NOTE — Assessment & Plan Note (Signed)
Concerning patient's chronic fatigue, patient is reporting ongoing fatigue but no worsening symptoms.  Fatigue is not well managed.  Advised patient to reduce work as she is currently working 3 jobs.  Provided education on stress management.  Follow-up with worsening or unresolved symptoms of fatigue.

## 2020-04-27 NOTE — Progress Notes (Signed)
Established Patient Office Visit  Subjective:  Patient ID: Anna Carney, female    DOB: 1950-09-28  Age: 69 y.o. MRN: [id number]  CC:  Chief Complaint  Patient presents with  . Hypertension    2 week follow up     HPI Anna Carney presents for follow up of hypertension. Patient was diagnosed in _5/10/21. The patient is tolerating the medication well without side effects.  Current medication losartan 25 mg tablet by mouth 1 daily.  Compliance with treatment has been good; patient's systolic blood pressure still slightly elevated.  Patient is taking medication as directed , maintains a healthy diet and following up as directed.    Concerning patient's chronic fatigue, patient is reporting ongoing fatigue but no worsening symptoms.  Patient reports desire to reduce work as she is currently working 3 jobs.     Past Medical History:  Diagnosis Date  . Anxiety   . Arthritis   . 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 Hypertension   . Shingles     Past Surgical History:  Procedure Laterality Date  . CHOLECYSTECTOMY    . MASTECTOMY W/ SENTINEL NODE BIOPSY Left 11/12/2018   Procedure: LEFT MASTECTOMY WITH LEFT AXILLARY SENTINEL LYMPH NODE BIOPSY;  Surgeon: Rolm Bookbinder, MD;  Location: [facility];  Service: General;  Laterality: Left;    Family History  Problem Relation Age of Onset  . Arthritis Mother   . Diabetes Mother   . Stroke Mother   . Asthma Father   . Colon cancer Brother   . Arthritis Daughter   . Thyroid disease Daughter   . Breast cancer Maternal Aunt     Social History   Socioeconomic History  . Marital status: Single    Spouse name: Not on file  . Number of children: Not on file  . Years of education: Not on file  . Highest education level: Not on file  Occupational History  . Occupation: [employer]    Comment: in garden center  . Occupation: [employer]    Comment: cafeteria and bus driver    Tobacco Use  . Smoking status: Former Research scientist (life sciences)  . Smokeless tobacco: Never Used  Vaping Use  . Vaping Use: Never used  Substance and Sexual Activity  . Alcohol use: Yes    Comment: rarely  . Drug use: No  . Sexual activity: Not Currently    Birth control/protection: Post-menopausal  Other Topics Concern  . Not on file  Social History Narrative  . Not on file   Social Determinants of Health   Financial Resource Strain:   . Difficulty of Paying Living Expenses: Not on file  Food Insecurity:   . Worried About Charity fundraiser in the Last Year: Not on file  . Ran Out of Food in the Last Year: Not on file  Transportation Needs:   . Lack of Transportation (Medical): Not on file  . Lack of Transportation (Non-Medical): Not on file  Physical Activity:   . Days of Exercise per Week: Not on file  . Minutes of Exercise per Session: Not on file  Stress:   . Feeling of Stress : Not on file  Social Connections:   . Frequency of Communication with Friends and Family: Not on file  . Frequency of Social Gatherings with Friends and Family: Not on file  . Attends Religious Services: Not on file  . Active Member of Clubs or Organizations: Not on file  .  Attends Archivist Meetings: Not on file  . Marital Status: Not on file  Intimate Partner Violence:   . Fear of Current or Ex-Partner: Not on file  . Emotionally Abused: Not on file  . Physically Abused: Not on file  . Sexually Abused: Not on file    Outpatient Medications Prior to Visit  Medication Sig Dispense Refill  . acetaminophen (TYLENOL) 325 MG tablet Take 650 mg by mouth every 6 (six) hours as needed.    [name] anastrozole (ARIMIDEX) 1 MG tablet Take 1 tablet by mouth once daily 30 tablet 0  . Ascorbic Acid (VITAMIN C) 1000 MG tablet Take 2,000 mg by mouth daily.    . calcium carbonate (OS-CAL) 1250 (500 Ca) MG chewable tablet Chew 1 tablet by mouth 2 (two) times daily.     . celecoxib (CELEBREX) 200 MG capsule Take 200  mg by mouth daily.    . cholecalciferol (VITAMIN D3) 25 MCG (1000 UT) tablet Take 2,000 Units by mouth daily.    . diclofenac Sodium (VOLTAREN) 1 % GEL Apply 2 g topically 4 (four) times daily.     [name] Calcium (STOOL SOFTENER PO) Take by mouth.    . losartan (COZAAR) 25 MG tablet Take 1 tablet (25 mg total) by mouth daily. 60 tablet 1  . Multiple Vitamin (MULTIVITAMIN) tablet Take 1 tablet by mouth daily.    . Zinc Sulfate (ZINC 15 PO) Take by mouth.    . prochlorperazine (COMPAZINE) 10 MG tablet TAKE 1 TABLET BY MOUTH EVERY 8 HOURS AS NEEDED FOR NAUSEA AND VOMITING 30 tablet 6   No facility-administered medications prior to visit.    Allergies  Allergen Reactions  . Sulfa Antibiotics Nausea Only    ROS Review of Systems  Respiratory: Negative for cough, chest tightness and shortness of breath.   Cardiovascular: Positive for leg swelling. Negative for chest pain.  All other systems reviewed and are negative.     Objective:    Physical Exam Vitals reviewed.  Constitutional:      Appearance: Normal appearance.  HENT:     Head: Normocephalic.  Eyes:     Conjunctiva/sclera: Conjunctivae normal.  Cardiovascular:     Rate and Rhythm: Normal rate and regular rhythm.     Pulses: Normal pulses.  Pulmonary:     Effort: Pulmonary effort is normal.     Breath sounds: Normal breath sounds.  Abdominal:     General: Bowel sounds are normal.  Musculoskeletal:     Right lower leg: Edema present.     Left lower leg: Edema present.  Skin:    General: Skin is warm.  Neurological:     Mental Status: She is alert and oriented to person, place, and time.  Psychiatric:        Mood and Affect: Mood normal.        Behavior: Behavior normal.     BP 135/70 Comment: home reading  Pulse 64   Temp 97.7 F (36.5 C)   Resp 20   Ht 5\' 4"  (1.626 m)   Wt 220 lb (99.8 kg)   SpO2 97%   BMI 37.76 kg/m  Wt Readings from Last 3 Encounters:  04/27/20 220 lb (99.8 kg)  04/03/20 216 lb  12.8 oz (98.3 kg)  01/26/20 218 lb 6.4 oz (99.1 kg)     Health Maintenance Due  Topic Date Due  . Hepatitis C Screening  Never done  . COVID-19 Vaccine (1) Never done  . COLONOSCOPY  Never done  . INFLUENZA VACCINE  03/19/2020    There are no preventive care reminders to display for this patient.  Lab Results  Component Value Date   TSH 0.416 (L) 10/21/2018   Lab Results  Component Value Date   WBC 5.3 01/19/2020   HGB 13.3 01/19/2020   HCT 40.3 01/19/2020   MCV 96.2 01/19/2020   PLT 158 01/19/2020   Lab Results  Component Value Date   NA 138 01/19/2020   K 3.7 01/19/2020   CO2 29 01/19/2020   GLUCOSE 102 (H) 01/19/2020   BUN 19 01/19/2020   CREATININE 0.64 01/19/2020   BILITOT 0.9 01/19/2020   ALKPHOS 60 01/19/2020   AST 27 01/19/2020   ALT 21 01/19/2020   PROT 7.2 01/19/2020   ALBUMIN 3.9 01/19/2020   CALCIUM 9.4 01/19/2020   ANIONGAP 8 01/19/2020     Lab Results  Component Value Date   HGBA1C 5.1 10/21/2018      Assessment & Plan:   Problem List Items Addressed This Visit      Cardiovascular and Mediastinum   Essential hypertension - Primary    Patient is following up today for hypertension.  Patient is tolerating medication well without side effects.  Current medication is losartan 25 mg tablet by mouth daily.  Compliance with treatment has been good.  Patient systolic blood pressure still slightly elevated.  Change losartan to 50 mg daily.  Patient will follow up with blood pressure log in 1 week. Okay Continue healthy diet and exercise.  Rx sent to pharmacy      Relevant Medications   losartan (COZAAR) 50 MG tablet   Other Relevant Orders   For home use only DME Other see comment     Other   Chronic fatigue    Concerning patient's chronic fatigue, patient is reporting ongoing fatigue but no worsening symptoms.  Fatigue is not well managed.  Advised patient to reduce work as she is currently working 3 jobs.  Provided education on stress  management.  Follow-up with worsening or unresolved symptoms of fatigue.      Edema leg    Trace bilateral lower extremity edema.  This is not new for patient but intermittent in the last few weeks.  Patient denies pain.  No changes to skin integrity or color.  Skin is not warm to touch.  Advised patient to elevate feet, ordered compression stockings. Provided education with printed handouts given.  Rx sent to pharmacy  Follow-up with worsening symptoms of edema.         Meds ordered this encounter  Medications  . losartan (COZAAR) 50 MG tablet    Sig: Take 1 tablet (50 mg total) by mouth daily.    Dispense:  30 tablet    Refill:  2    Order Specific Question:   Supervising Provider    Answer:   Caryl Pina A [id number]    Follow-up: Return in about 3 months (around 07/27/2020).    Ivy Lynn, NP

## 2020-04-27 NOTE — Patient Instructions (Signed)

## 2020-05-08 ENCOUNTER — Encounter: Payer: Self-pay | Admitting: Nurse Practitioner

## 2020-05-16 ENCOUNTER — Encounter: Payer: Self-pay | Admitting: *Deleted

## 2020-05-16 IMAGING — MG DIGITAL SCREENING UNILAT RIGHT W/ TOMO W/ CAD
8 series · 8 of 24 positions shown · non-contrast
Comparison: Previous exam(s).

CLINICAL DATA: Screening.

EXAM:
DIGITAL SCREENING UNILATERAL RIGHT MAMMOGRAM WITH CAD AND TOMO

[R MLO synth-2D (1 of 2)]
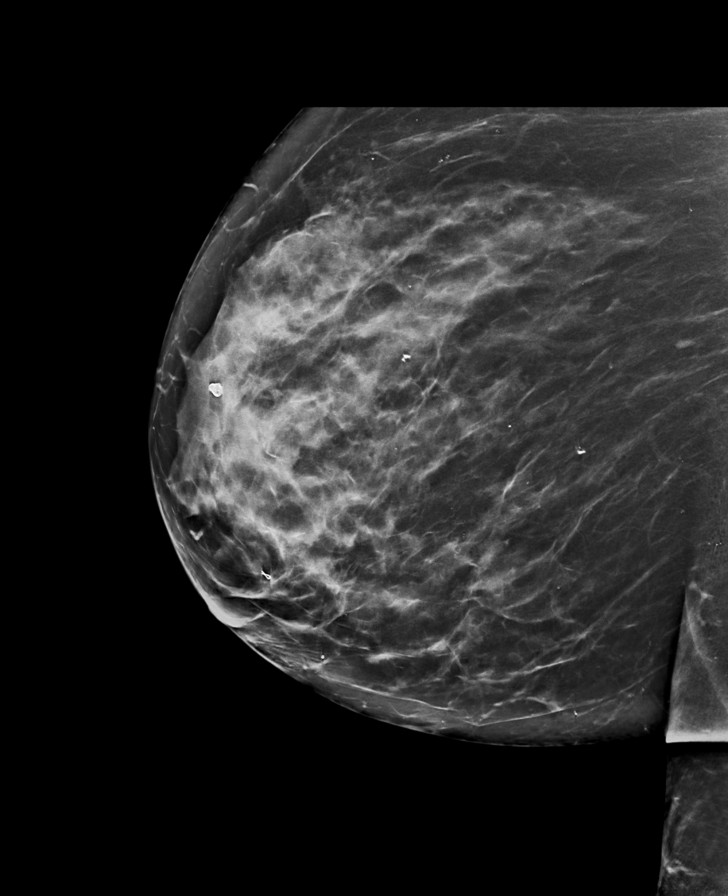

[R CC synth-2D (1 of 2)]
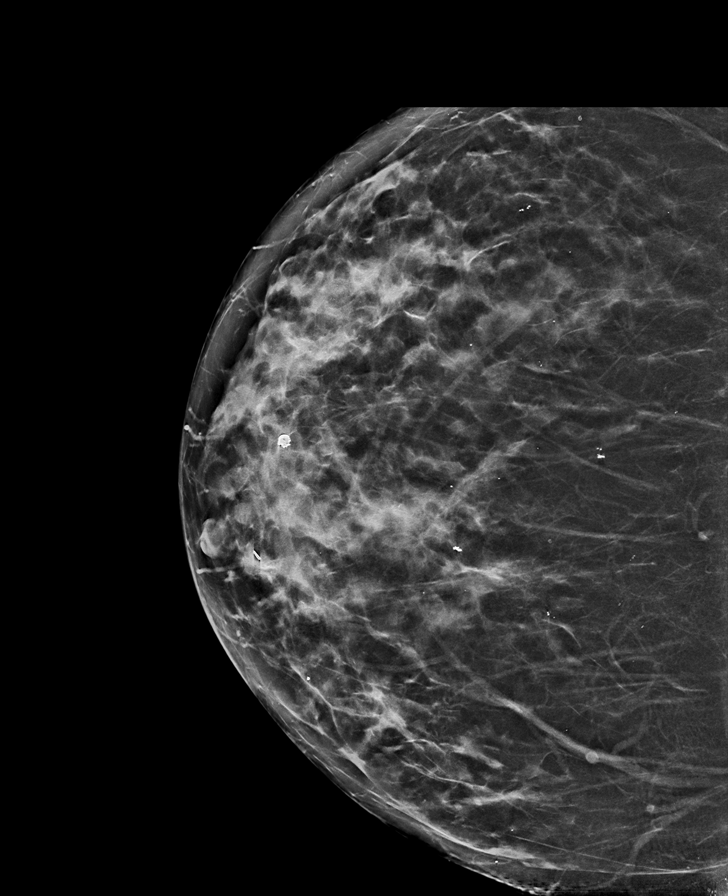

[R CC synth-2D (2 of 2)]
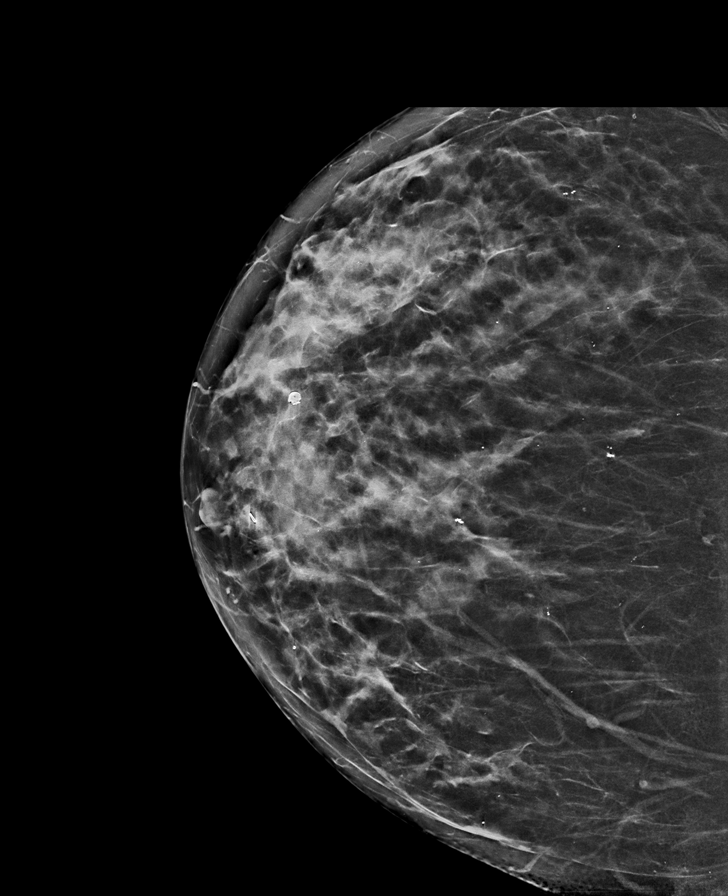

[R MLO synth-2D (2 of 2)]
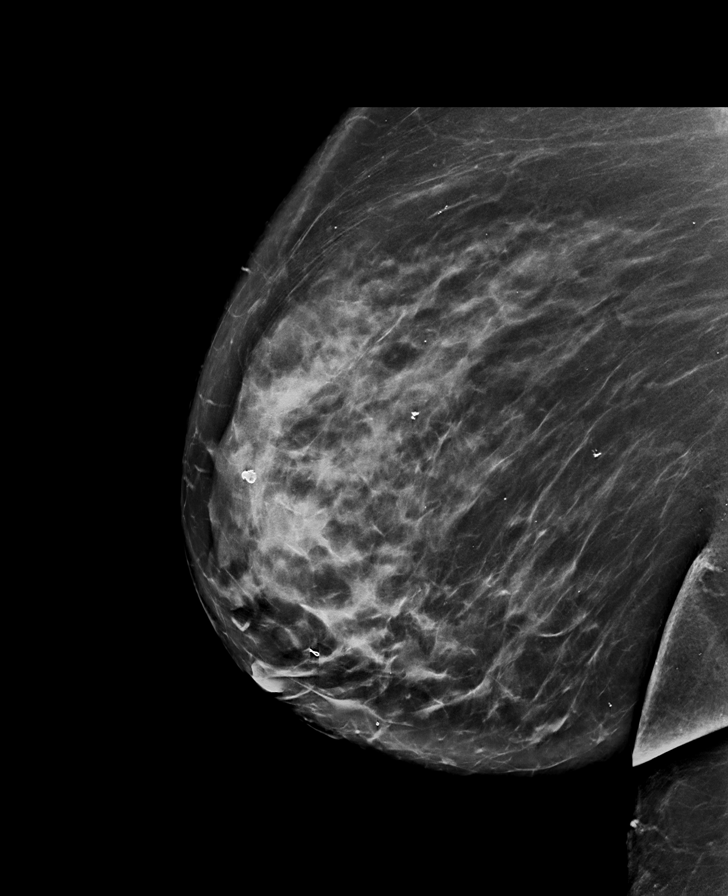

[R MLO tomo (1 of 2) · tomo slice 45/90.0]
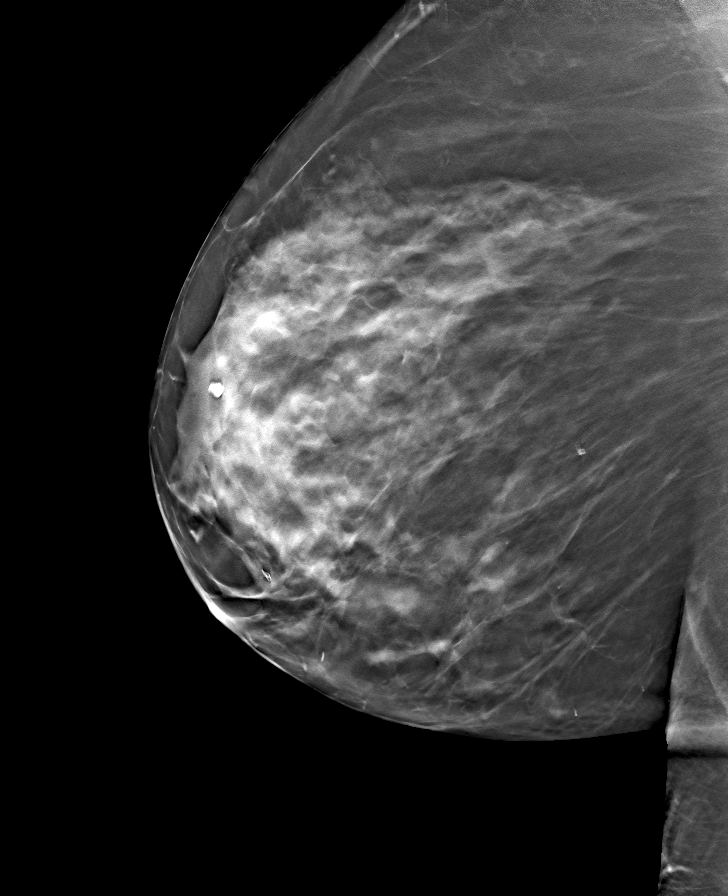

[R CC tomo (1 of 2) · tomo slice 39/78.0]
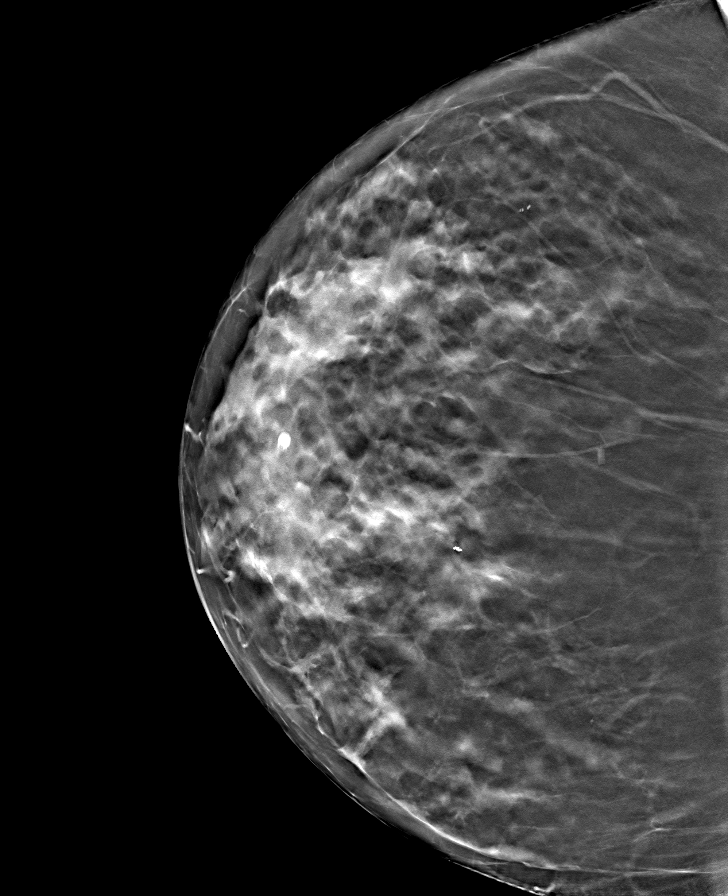

[R MLO tomo (2 of 2) · tomo slice 49/98.0]
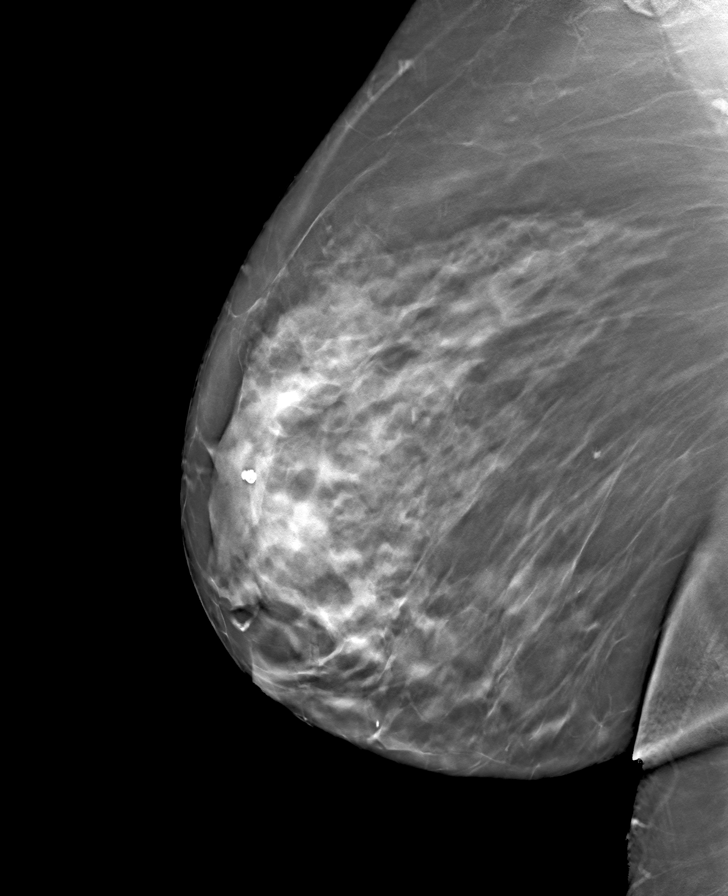

[R CC tomo (2 of 2) · tomo slice 39/76.0]
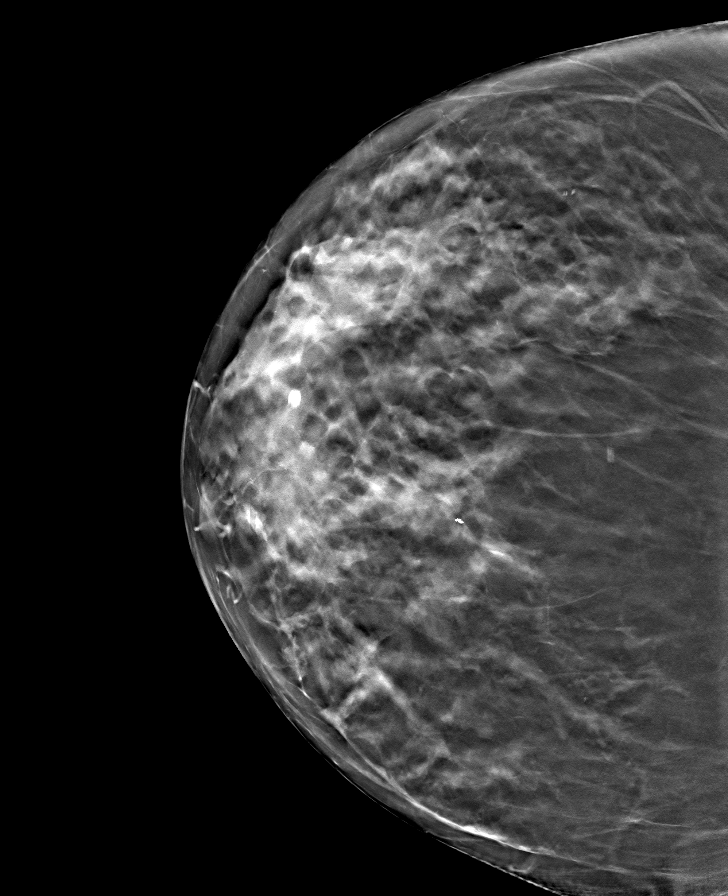

[8 of 24 positions shown; findings below may reference images not displayed]

ACR Breast Density Category c: The breast tissue is heterogeneously
dense, which may obscure small masses.
FINDINGS: The patient has had a left mastectomy. There are no findings
suspicious for malignancy. Images were processed with CAD.
IMPRESSION: No mammographic evidence of malignancy. A result letter of this
screening mammogram will be mailed directly to the patient.

RECOMMENDATION:
Screening mammogram in one year.  (Code:CY-P-D4Y)

BI-RADS CATEGORY  1: Negative.

## 2020-05-19 ENCOUNTER — Other Ambulatory Visit (HOSPITAL_COMMUNITY): Payer: Self-pay | Admitting: Nurse Practitioner

## 2020-05-19 DIAGNOSIS — C50512 Malignant neoplasm of lower-outer quadrant of left female breast: Secondary | ICD-10-CM

## 2020-05-30 ENCOUNTER — Ambulatory Visit (HOSPITAL_COMMUNITY): Payer: BC Managed Care – PPO

## 2020-06-06 ENCOUNTER — Ambulatory Visit (HOSPITAL_COMMUNITY): Payer: BC Managed Care – PPO | Admitting: Hematology

## 2020-06-12 ENCOUNTER — Other Ambulatory Visit (HOSPITAL_COMMUNITY): Payer: Self-pay

## 2020-06-12 DIAGNOSIS — Z170 Estrogen receptor positive status [ER+]: Secondary | ICD-10-CM

## 2020-06-12 MED ORDER — ANASTROZOLE 1 MG PO TABS: 1.0000 mg | ORAL_TABLET | Freq: Every day | ORAL | 6 refills | Status: DC

## 2020-06-13 ENCOUNTER — Inpatient Hospital Stay (HOSPITAL_COMMUNITY): Payer: BC Managed Care – PPO | Attending: Hematology

## 2020-06-13 ENCOUNTER — Other Ambulatory Visit: Payer: Self-pay

## 2020-06-13 DIAGNOSIS — C50512 Malignant neoplasm of lower-outer quadrant of left female breast: Secondary | ICD-10-CM

## 2020-06-13 LAB — CBC WITH DIFFERENTIAL/PLATELET
Abs Immature Granulocytes: 0.02 10*3/uL (ref 0.00–0.07)
Basophils Absolute: 0.1 10*3/uL (ref 0.0–0.1)
Basophils Relative: 1 %
Eosinophils Absolute: 0.4 10*3/uL (ref 0.0–0.5)
Eosinophils Relative: 5 %
HCT: 40.7 % (ref 36.0–46.0)
Hemoglobin: 13.2 g/dL (ref 12.0–15.0)
Immature Granulocytes: 0 %
Lymphocytes Relative: 21 %
Lymphs Abs: 1.5 10*3/uL (ref 0.7–4.0)
MCH: 31.4 pg (ref 26.0–34.0)
MCHC: 32.4 g/dL (ref 30.0–36.0)
MCV: 96.7 fL (ref 80.0–100.0)
Monocytes Absolute: 0.6 10*3/uL (ref 0.1–1.0)
Monocytes Relative: 9 %
Neutro Abs: 4.4 10*3/uL (ref 1.7–7.7)
Neutrophils Relative %: 64 %
Platelets: 169 10*3/uL (ref 150–400)
RBC: 4.21 MIL/uL (ref 3.87–5.11)
RDW: 12.2 % (ref 11.5–15.5)
WBC: 7 10*3/uL (ref 4.0–10.5)
nRBC: 0 % (ref 0.0–0.2)

## 2020-06-13 LAB — COMPREHENSIVE METABOLIC PANEL
ALT: 18 U/L (ref 0–44)
AST: 23 U/L (ref 15–41)
Albumin: 4 g/dL (ref 3.5–5.0)
Alkaline Phosphatase: 67 U/L (ref 38–126)
Anion gap: 10 (ref 5–15)
BUN: 26 mg/dL — ABNORMAL HIGH (ref 8–23)
CO2: 29 mmol/L (ref 22–32)
Calcium: 10.1 mg/dL (ref 8.9–10.3)
Chloride: 102 mmol/L (ref 98–111)
Creatinine, Ser: 0.74 mg/dL (ref 0.44–1.00)
GFR, Estimated: >60 mL/min (ref >60)
Glucose, Bld: 96 mg/dL (ref 70–99)
Potassium: 4.3 mmol/L (ref 3.5–5.1)
Sodium: 141 mmol/L (ref 135–145)
Total Bilirubin: 0.5 mg/dL (ref 0.3–1.2)
Total Protein: 7.3 g/dL (ref 6.5–8.1)

## 2020-06-14 LAB — CANCER ANTIGEN 15-3: CA 15-3: 21.9 U/mL (ref 0.0–25.0)

## 2020-06-20 ENCOUNTER — Encounter (HOSPITAL_COMMUNITY): Payer: Self-pay | Admitting: Hematology

## 2020-06-20 ENCOUNTER — Inpatient Hospital Stay (HOSPITAL_COMMUNITY): Payer: BC Managed Care – PPO | Attending: Hematology | Admitting: Hematology

## 2020-06-20 ENCOUNTER — Other Ambulatory Visit: Payer: Self-pay

## 2020-06-20 VITALS — BP 108/64 | HR 77 | Temp 97.1°F | Resp 17 | Wt 219.1 lb

## 2020-06-20 DIAGNOSIS — C50512 Malignant neoplasm of lower-outer quadrant of left female breast: Secondary | ICD-10-CM | POA: Insufficient documentation

## 2020-06-20 DIAGNOSIS — Z79811 Long term (current) use of aromatase inhibitors: Secondary | ICD-10-CM | POA: Insufficient documentation

## 2020-06-20 DIAGNOSIS — N951 Menopausal and female climacteric states: Secondary | ICD-10-CM | POA: Insufficient documentation

## 2020-06-20 DIAGNOSIS — M8580 Other specified disorders of bone density and structure, unspecified site: Secondary | ICD-10-CM | POA: Diagnosis not present

## 2020-06-20 DIAGNOSIS — Z170 Estrogen receptor positive status [ER+]: Secondary | ICD-10-CM | POA: Insufficient documentation

## 2020-06-20 NOTE — Progress Notes (Signed)
Anna Carney, [place] [address]   Patient Care Team: Ivy Lynn, NP as PCP - General (Nurse Practitioner)  SUMMARY OF ONCOLOGIC HISTORY: Oncology History  Malignant neoplasm of lower-outer quadrant of left breast of female, estrogen receptor positive ([place])  10/27/2018 Initial Diagnosis   Malignant neoplasm of lower-outer quadrant of left breast of female, estrogen receptor positive ([place])   12/10/2018 Cancer Staging   Staging form: Breast, AJCC 8th Edition - Clinical stage from 12/10/2018: Stage IIIB (cT4b, cN0, cM0, G2, ER+, PR+, HER2-, Oncotype DX score: 2) - Signed by Derek Jack, MD on 12/10/2018     CHIEF COMPLIANT: Follow up for left breast cancer   INTERVAL HISTORY: Anna Carney is a 69 y.o. female here today for follow up of her left breast cancer. Her last visit was on 01/26/2020.   Today she reports feeling well. She is tolerating Arimidex well. She is taking black cohosh for her hot flashes and reports that they are well-controlled.   REVIEW OF SYSTEMS:   Review of Systems  Constitutional: Positive for appetite change (70%) and fatigue (70%).  Endocrine: Positive for hot flashes (controlled with black cohosh).  All other systems reviewed and are negative.   I have reviewed the past medical history, past surgical history, social history and family history with the patient and they are unchanged from previous note.   ALLERGIES:   is allergic to sulfa antibiotics.   MEDICATIONS:  Current Outpatient Medications  Medication Sig Dispense Refill  . acetaminophen (TYLENOL) 325 MG tablet Take 650 mg by mouth every 6 (six) hours as needed.    [name] anastrozole (ARIMIDEX) 1 MG tablet Take 1 tablet (1 mg total) by mouth daily. 30 tablet 6  . Ascorbic Acid (VITAMIN C) 1000 MG tablet Take 2,000 mg by mouth daily.    . calcium carbonate (OS-CAL) 1250 (500 Ca) MG chewable tablet Chew 1 tablet by mouth 2 (two) times daily.       . celecoxib (CELEBREX) 200 MG capsule Take 200 mg by mouth daily.    . cholecalciferol (VITAMIN D3) 25 MCG (1000 UT) tablet Take 2,000 Units by mouth daily.    . diclofenac Sodium (VOLTAREN) 1 % GEL Apply 2 g topically 4 (four) times daily.     [name] Calcium (STOOL SOFTENER PO) Take by mouth.    . losartan (COZAAR) 50 MG tablet Take 1 tablet (50 mg total) by mouth daily. 30 tablet 2  . Multiple Vitamin (MULTIVITAMIN) tablet Take 1 tablet by mouth daily.    . Zinc Sulfate (ZINC 15 PO) Take by mouth.     No current facility-administered medications for this visit.     PHYSICAL EXAMINATION: Performance status (ECOG): 1 - Symptomatic but completely ambulatory  Vitals:   06/20/20 1537  BP: 108/64  Pulse: 77  Resp: 17  Temp: (!) 97.1 F (36.2 C)  SpO2: 97%   Wt Readings from Last 3 Encounters:  06/20/20 219 lb 1.6 oz (99.4 kg)  04/27/20 220 lb (99.8 kg)  04/03/20 216 lb 12.8 oz (98.3 kg)   Physical Exam Vitals reviewed.  Constitutional:      Appearance: Normal appearance. She is obese.  Cardiovascular:     Rate and Rhythm: Normal rate and regular rhythm.     Pulses: Normal pulses.     Heart sounds: Normal heart sounds.  Pulmonary:     Effort: Pulmonary effort is normal.     Breath sounds: Normal breath sounds.  Chest:     Breasts:        Right: Normal. No mass, skin change or tenderness.        Left: Absent. No mass, skin change or tenderness.  Abdominal:     Palpations: Abdomen is soft. There is no mass.     Tenderness: There is no abdominal tenderness.  Musculoskeletal:     Right lower leg: No edema.     Left lower leg: No edema.  Neurological:     General: No focal deficit present.     Mental Status: She is alert and oriented to person, place, and time.  Psychiatric:        Mood and Affect: Mood normal.        Behavior: Behavior normal.     Breast Exam Chaperone: Milinda Antis, MD     LABORATORY DATA:  I have reviewed the data as listed CMP  Latest Ref Rng & Units 06/13/2020 01/19/2020 09/13/2019  Glucose 70 - 99 mg/dL 96 102(H) 97  BUN 8 - 23 mg/dL 26(H) 19 24(H)  Creatinine 0.44 - 1.00 mg/dL 0.74 0.64 0.64  Sodium 135 - 145 mmol/L 141 138 138  Potassium 3.5 - 5.1 mmol/L 4.3 3.7 3.6  Chloride 98 - 111 mmol/L 102 101 98  CO2 22 - 32 mmol/L '29 29 29  ' Calcium 8.9 - 10.3 mg/dL 10.1 9.4 9.4  Total Protein 6.5 - 8.1 g/dL 7.3 7.2 7.0  Total Bilirubin 0.3 - 1.2 mg/dL 0.5 0.9 0.5  Alkaline Phos 38 - 126 U/L 67 60 67  AST 15 - 41 U/L '23 27 25  ' ALT 0 - 44 U/L '18 21 23   ' Lab Results  Component Value Date   CAN153 21.9 06/13/2020   CAN153 21.3 09/13/2019   Lab Results  Component Value Date   WBC 7.0 06/13/2020   HGB 13.2 06/13/2020   HCT 40.7 06/13/2020   MCV 96.7 06/13/2020   PLT 169 06/13/2020   NEUTROABS 4.4 06/13/2020    ASSESSMENT:  1. Stage IIIb (T4N0) left breast IDC, ER/PR positive, HER-2 negative: -Biopsy of the left breast on 10/20/2018 showed IDC.  Left axillary core biopsy was negative. -Left mastectomy on 11/12/2018, 11 cm IDC, grade 2, margins negative, 0/14 lymph node positive, ER/PR positive, HER-2 2+ by IHC and negative by FISH, Ki-67 10%. -Oncotype DX recurrence score of 2. -Anastrozole started on 12/10/2018. -XRT from 02/01/2019-03/18/2019. -Right breast mammogram on 12/16/2019 shows BI-RADS 1.  2.  Osteopenia: -DEXA scan on 02/09/2019 shows T score of -2. 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   PLAN:  1. Stage IIIb (T4N0) left breast IDC, ER/PR positive, HER-2 negative: -Left mastectomy site is within normal limits.  Right breast has no palpable masses.  No palpable adenopathy. -Continue anastrozole.  Hot flashes have improved. -Reviewed labs from 06/13/2020.  LFTs and CBC are normal.  Calcium and creatinine normal.  Reviewed mammogram results which were normal.  CA 15-3 also normal. -RTC 6 months with labs and physical exam.  2. Hot flashes: -She did not want to take Effexor or black  cohosh. -She reports improvement in hot flashes since last visit.  3. Osteopenia: -Continue calcium and vitamin D.  I plan to repeat DEXA scan prior to next visit in 6 months.   Breast Cancer therapy associated bone loss: I have recommended calcium, Vitamin D and weight bearing exercises.   No orders of the defined types were placed in this encounter.  The patient has a good  understanding of the overall plan. she agrees with it. she will call with any problems that may develop before the next visit here.    Derek Jack, MD [facility] [phone]   I, Milinda Antis, am acting as a scribe for Dr. Sanda Linger.  I, Derek Jack MD, have reviewed the above documentation for accuracy and completeness, and I agree with the above.

## 2020-06-20 NOTE — Progress Notes (Signed)
Confirmed that patient is taking the anastrozole as directed and denies any side effects.

## 2020-06-20 NOTE — Patient Instructions (Signed)
[facility] at [facility] Discharge Instructions  You were seen today by Dr. Delton Coombes. He went over your recent results. You will be scheduled for DEXA scan before your next visit. Dr. Delton Coombes will see you back in 6 months for labs and follow up.   Thank you for choosing [facility] at [facility] to provide your oncology and hematology care.  To afford each patient quality time with our provider, please arrive at least 15 minutes before your scheduled appointment time.   If you have a lab appointment with the [facility] please come in thru the Main Entrance and check in at the main information desk  You need to re-schedule your appointment should you arrive 10 or more minutes late.  We strive to give you quality time with our providers, and arriving late affects you and other patients whose appointments are after yours.  Also, if you no show three or more times for appointments you may be dismissed from the clinic at the providers discretion.     Again, thank you for choosing [facility].  Our hope is that these requests will decrease the amount of time that you wait before being seen by our physicians.       _____________________________________________________________  Should you have questions after your visit to [facility], please contact our office at [phone] between the hours of 8:00 a.m. and 4:30 p.m.  Voicemails left after 4:00 p.m. will not be returned until the following business day.  For prescription refill requests, have your pharmacy contact our office and allow 72 hours.    Cancer Center Support Programs:   > Cancer Support Group  2nd Tuesday of the month 1pm-2pm, Journey Room

## 2020-07-24 ENCOUNTER — Other Ambulatory Visit: Payer: Self-pay

## 2020-07-24 MED ORDER — LOSARTAN POTASSIUM 50 MG PO TABS
50.0000 mg | ORAL_TABLET | Freq: Every day | ORAL | 0 refills | Status: DC
Start: 2020-07-24 — End: 2020-07-26

## 2020-07-26 ENCOUNTER — Ambulatory Visit: Payer: BC Managed Care – PPO | Admitting: Nurse Practitioner

## 2020-07-26 ENCOUNTER — Other Ambulatory Visit: Payer: Self-pay

## 2020-07-26 ENCOUNTER — Encounter: Payer: Self-pay | Admitting: Nurse Practitioner

## 2020-07-26 VITALS — BP 126/75 | HR 70 | Temp 98.4°F | Ht 64.0 in | Wt 219.8 lb

## 2020-07-26 DIAGNOSIS — I10 Essential (primary) hypertension: Secondary | ICD-10-CM | POA: Diagnosis not present

## 2020-07-26 DIAGNOSIS — Z23 Encounter for immunization: Secondary | ICD-10-CM | POA: Diagnosis not present

## 2020-07-26 MED ORDER — LOSARTAN POTASSIUM 50 MG PO TABS: 50.0000 mg | ORAL_TABLET | Freq: Every day | ORAL | 1 refills | Status: DC

## 2020-07-26 NOTE — Progress Notes (Signed)
Established Patient Office Visit  Subjective:  Patient ID: Anna Carney, female    DOB: April 18, 1951  Age: 69 y.o. MRN: [id number]  CC:  Chief Complaint  Patient presents with  . Hypertension    3 mth , fasting, no concerns     HPI [name] presents for Pt presents for follow up of hypertension. Patient was diagnosed in 12/27/2019 The patient is tolerating the medication well without side effects. Compliance with treatment has been good; including taking medication as directed , maintains a healthy diet and regular exercise regimen , and following up as directed. Current medication Cozaar 50 mg tablet by mouth daily,  Past Medical History:  Diagnosis Date  . Anxiety   . Arthritis   . Cancer ([place]) 10/2018   left breast IDC  . Hypertension   . Shingles     Past Surgical History:  Procedure Laterality Date  . CHOLECYSTECTOMY    . MASTECTOMY W/ SENTINEL NODE BIOPSY Left 11/12/2018   Procedure: LEFT MASTECTOMY WITH LEFT AXILLARY SENTINEL LYMPH NODE BIOPSY;  Surgeon: Rolm Bookbinder, MD;  Location: [facility];  Service: General;  Laterality: Left;    Family History  Problem Relation Age of Onset  . Arthritis Mother   . Diabetes Mother   . Stroke Mother   . Asthma Father   . Colon cancer Brother   . Arthritis Daughter   . Thyroid disease Daughter   . Breast cancer Maternal Aunt     Social History   Socioeconomic History  . Marital status: Single    Spouse name: Not on file  . Number of children: Not on file  . Years of education: Not on file  . Highest education level: Not on file  Occupational History  . Occupation: [employer]    Comment: in garden center  . Occupation: [employer]    Comment: cafeteria and bus driver  Tobacco Use  . Smoking status: Former Research scientist (life sciences)  . Smokeless tobacco: Never Used  Vaping Use  . Vaping Use: Never used  Substance and Sexual Activity  . Alcohol use: Yes    Comment: rarely  .  Drug use: No  . Sexual activity: Not Currently    Birth control/protection: Post-menopausal  Other Topics Concern  . Not on file  Social History Narrative  . Not on file   Social Determinants of Health   Financial Resource Strain: Low Risk   . Difficulty of Paying Living Expenses: Not hard at all  Food Insecurity: No Food Insecurity  . Worried About Charity fundraiser in the Last Year: Never true  . Ran Out of Food in the Last Year: Never true  Transportation Needs: No Transportation Needs  . Lack of Transportation (Medical): No  . Lack of Transportation (Non-Medical): No  Physical Activity: Inactive  . Days of Exercise per Week: 0 days  . Minutes of Exercise per Session: 0 min  Stress: Stress Concern Present  . Feeling of Stress : To some extent  Social Connections: Socially Isolated  . Frequency of Communication with Friends and Family: More than three times a week  . Frequency of Social Gatherings with Friends and Family: More than three times a week  . Attends Religious Services: Never  . Active Member of Clubs or Organizations: No  . Attends Archivist Meetings: Never  . Marital Status: Never married  Intimate Partner Violence: Not At Risk  . Fear of Current or Ex-Partner: No  . Emotionally Abused:  No  . Physically Abused: No  . Sexually Abused: No    Outpatient Medications Prior to Visit  Medication Sig Dispense Refill  . acetaminophen (TYLENOL) 325 MG tablet Take 650 mg by mouth every 6 (six) hours as needed.    [name] anastrozole (ARIMIDEX) 1 MG tablet Take 1 tablet (1 mg total) by mouth daily. 30 tablet 6  . Ascorbic Acid (VITAMIN C) 1000 MG tablet Take 2,000 mg by mouth daily.    . calcium carbonate (OS-CAL) 1250 (500 Ca) MG chewable tablet Chew 1 tablet by mouth 2 (two) times daily.     . celecoxib (CELEBREX) 200 MG capsule Take 200 mg by mouth daily.    . cholecalciferol (VITAMIN D3) 25 MCG (1000 UT) tablet Take 2,000 Units by mouth daily.    .  diclofenac Sodium (VOLTAREN) 1 % GEL Apply 2 g topically 4 (four) times daily.     [name] Calcium (STOOL SOFTENER PO) Take by mouth.    . Multiple Vitamin (MULTIVITAMIN) tablet Take 1 tablet by mouth daily.    . Zinc Sulfate (ZINC 15 PO) Take by mouth.    . losartan (COZAAR) 50 MG tablet Take 1 tablet (50 mg total) by mouth daily. (Needs to be seen before next refill) 30 tablet 0   No facility-administered medications prior to visit.    Allergies  Allergen Reactions  . Sulfa Antibiotics Nausea Only    ROS Review of Systems  Neurological: Negative for syncope, light-headedness, numbness and headaches.  All other systems reviewed and are negative.     Objective:    Physical Exam Vitals reviewed.  Constitutional:      General: She is awake.     Appearance: Normal appearance. She is well-groomed.  HENT:     Head: Normocephalic.  Cardiovascular:     Rate and Rhythm: Normal rate and regular rhythm.     Pulses: Normal pulses.     Heart sounds: Normal heart sounds.  Pulmonary:     Effort: Pulmonary effort is normal.     Breath sounds: Normal breath sounds.  Abdominal:     General: Bowel sounds are normal.  Skin:    General: Skin is warm.  Neurological:     Mental Status: She is alert and oriented to person, place, and time.  Psychiatric:        Mood and Affect: Mood normal.        Behavior: Behavior normal. Behavior is cooperative.     BP 126/75   Pulse 70   Temp 98.4 F (36.9 C) (Temporal)   Ht 5\' 4"  (1.626 m)   Wt 219 lb 12.8 oz (99.7 kg)   SpO2 97%   BMI 37.73 kg/m  Wt Readings from Last 3 Encounters:  07/26/20 219 lb 12.8 oz (99.7 kg)  06/20/20 219 lb 1.6 oz (99.4 kg)  04/27/20 220 lb (99.8 kg)     There are no preventive care reminders to display for this patient.  There are no preventive care reminders to display for this patient.  Lab Results  Component Value Date   TSH 0.416 (L) 10/21/2018   Lab Results  Component Value Date   WBC 7.0  06/13/2020   HGB 13.2 06/13/2020   HCT 40.7 06/13/2020   MCV 96.7 06/13/2020   PLT 169 06/13/2020   Lab Results  Component Value Date   NA 141 06/13/2020   K 4.3 06/13/2020   CO2 29 06/13/2020   GLUCOSE 96 06/13/2020   BUN 26 (H)  06/13/2020   CREATININE 0.74 06/13/2020   BILITOT 0.5 06/13/2020   ALKPHOS 67 06/13/2020   AST 23 06/13/2020   ALT 18 06/13/2020   PROT 7.3 06/13/2020   ALBUMIN 4.0 06/13/2020   CALCIUM 10.1 06/13/2020   ANIONGAP 10 06/13/2020   No results found for: CHOL No results found for: HDL No results found for: LDLCALC No results found for: TRIG No results found for: CHOLHDL Lab Results  Component Value Date   HGBA1C 5.1 10/21/2018      Assessment & Plan:   Problem List Items Addressed This Visit      Cardiovascular and Mediastinum   Essential hypertension - Primary    Essential hypertension well-controlled on current medication no changes necessary to current dose. Continue healthy diet and exercise regimen as tolerated. Refill sent to pharmacy. Provided education with printed handouts given. Follow-up in 3 months      Relevant Medications   losartan (COZAAR) 50 MG tablet      Meds ordered this encounter  Medications  . losartan (COZAAR) 50 MG tablet    Sig: Take 1 tablet (50 mg total) by mouth daily. (Needs to be seen before next refill)    Dispense:  90 tablet    Refill:  1    Follow-up: Return in about 3 months (around 10/24/2020).    Ivy Lynn, NP

## 2020-07-26 NOTE — Assessment & Plan Note (Signed)
Essential hypertension well-controlled on current medication no changes necessary to current dose. Continue healthy diet and exercise regimen as tolerated. Refill sent to pharmacy. Provided education with printed handouts given. Follow-up in 3 months

## 2020-07-26 NOTE — Patient Instructions (Signed)
Hypertension well managed on current [place] no changes necessary continue on current dose follow-up in 3 months. Refill sent to pharmacy  Hypertension, Adult Hypertension is another name for high blood pressure. High blood pressure forces your heart to work harder to pump blood. This can cause problems over time. There are two numbers in a blood pressure reading. There is a top number (systolic) over a bottom number (diastolic). It is best to have a blood pressure that is below 120/80. Healthy choices can help lower your blood pressure, or you may need medicine to help lower it. What are the causes? The cause of this condition is not known. Some conditions may be related to high blood pressure. What increases the risk?  Smoking.  Having type 2 diabetes mellitus, high cholesterol, or both.  Not getting enough exercise or physical activity.  Being overweight.  Having too much fat, sugar, calories, or salt (sodium) in your diet.  Drinking too much alcohol.  Having long-term (chronic) kidney disease.  Having a family history of high blood pressure.  Age. Risk increases with age.  Race. You may be at higher risk if you are African American.  Gender. Men are at higher risk than women before age 41. After age 17, women are at higher risk than men.  Having obstructive sleep apnea.  Stress. What are the signs or symptoms?  High blood pressure may not cause symptoms. Very high blood pressure (hypertensive crisis) may cause: ? Headache. ? Feelings of worry or nervousness (anxiety). ? Shortness of breath. ? Nosebleed. ? A feeling of being sick to your stomach (nausea). ? Throwing up (vomiting). ? Changes in how you see. ? Very bad chest pain. ? Seizures. How is this treated?  This condition is treated by making healthy lifestyle changes, such as: ? Eating healthy foods. ? Exercising more. ? Drinking less alcohol.  Your health care provider may prescribe medicine if lifestyle  changes are not enough to get your blood pressure under control, and if: ? Your top number is above 130. ? Your bottom number is above 80.  Your personal target blood pressure may vary. Follow these instructions at home: Eating and drinking   If told, follow the DASH eating plan. To follow this plan: ? Fill one half of your plate at each meal with fruits and vegetables. ? Fill one fourth of your plate at each meal with whole grains. Whole grains include whole-wheat pasta, brown rice, and whole-grain bread. ? Eat or drink low-fat dairy products, such as skim milk or low-fat yogurt. ? Fill one fourth of your plate at each meal with low-fat (lean) proteins. Low-fat proteins include fish, chicken without skin, eggs, beans, and tofu. ? Avoid fatty meat, cured and processed meat, or chicken with skin. ? Avoid pre-made or processed food.  Eat less than 1,500 mg of salt each day.  Do not drink alcohol if: ? Your doctor tells you not to drink. ? You are pregnant, may be pregnant, or are planning to become pregnant.  If you drink alcohol: ? Limit how much you use to:  0-1 drink a day for women.  0-2 drinks a day for men. ? Be aware of how much alcohol is in your drink. In the [place] one drink equals one 12 oz bottle of beer (355 mL), one 5 oz glass of wine (148 mL), or one 1 oz glass of hard liquor (44 mL). Lifestyle   Work with your doctor to stay at a healthy weight or  to lose weight. Ask your doctor what the best weight is for you.  Get at least 30 minutes of exercise most days of the week. This may include walking, swimming, or biking.  Get at least 30 minutes of exercise that strengthens your muscles (resistance exercise) at least 3 days a week. This may include lifting weights or doing Pilates.  Do not use any products that contain nicotine or tobacco, such as cigarettes, e-cigarettes, and chewing tobacco. If you need help quitting, ask your doctor.  Check your blood pressure at  home as told by your doctor.  Keep all follow-up visits as told by your doctor. This is important. Medicines  Take over-the-counter and prescription medicines only as told by your doctor. Follow directions carefully.  Do not skip doses of blood pressure medicine. The medicine does not work as well if you skip doses. Skipping doses also puts you at risk for problems.  Ask your doctor about side effects or reactions to medicines that you should watch for. Contact a doctor if you:  Think you are having a reaction to the medicine you are taking.  Have headaches that keep coming back (recurring).  Feel dizzy.  Have swelling in your ankles.  Have trouble with your vision. Get help right away if you:  Get a very bad headache.  Start to feel mixed up (confused).  Feel weak or numb.  Feel faint.  Have very bad pain in your: ? Chest. ? Belly (abdomen).  Throw up more than once.  Have trouble breathing. Summary  Hypertension is another name for high blood pressure.  High blood pressure forces your heart to work harder to pump blood.  For most people, a normal blood pressure is less than 120/80.  Making healthy choices can help lower blood pressure. If your blood pressure does not get lower with healthy choices, you may need to take medicine. This information is not intended to replace advice given to you by your health care provider. Make sure you discuss any questions you have with your health care provider. Document Revised: 04/15/2018 Document Reviewed: 04/15/2018 Elsevier Patient Education  2020 [employer].

## 2020-07-27 ENCOUNTER — Ambulatory Visit: Payer: BC Managed Care – PPO | Admitting: Nurse Practitioner

## 2020-09-12 ENCOUNTER — Encounter: Payer: Self-pay | Admitting: *Deleted

## 2020-09-29 ENCOUNTER — Encounter: Payer: Self-pay | Admitting: *Deleted

## 2020-10-20 ENCOUNTER — Other Ambulatory Visit: Payer: Self-pay

## 2020-10-20 ENCOUNTER — Ambulatory Visit: Payer: BC Managed Care – PPO | Admitting: Nurse Practitioner

## 2020-10-20 ENCOUNTER — Encounter: Payer: Self-pay | Admitting: Nurse Practitioner

## 2020-10-20 VITALS — BP 128/68 | HR 93 | Temp 97.6°F | Ht 64.0 in | Wt 219.8 lb

## 2020-10-20 DIAGNOSIS — I10 Essential (primary) hypertension: Secondary | ICD-10-CM | POA: Diagnosis not present

## 2020-10-20 DIAGNOSIS — F064 Anxiety disorder due to known physiological condition: Secondary | ICD-10-CM | POA: Diagnosis not present

## 2020-10-20 DIAGNOSIS — F321 Major depressive disorder, single episode, moderate: Secondary | ICD-10-CM

## 2020-10-20 DIAGNOSIS — F419 Anxiety disorder, unspecified: Secondary | ICD-10-CM | POA: Insufficient documentation

## 2020-10-20 LAB — COMPREHENSIVE METABOLIC PANEL
ALT: 13 IU/L (ref 0–32)
AST: 20 IU/L (ref 0–40)
Albumin/Globulin Ratio: 1.8 (ref 1.2–2.2)
Albumin: 4.2 g/dL (ref 3.8–4.8)
Alkaline Phosphatase: 91 IU/L (ref 44–121)
BUN/Creatinine Ratio: 22 (ref 12–28)
BUN: 17 mg/dL (ref 8–27)
Bilirubin Total: 0.4 mg/dL (ref 0.0–1.2)
CO2: 26 mmol/L (ref 20–29)
Calcium: 9.3 mg/dL (ref 8.7–10.3)
Chloride: 103 mmol/L (ref 96–106)
Creatinine, Ser: 0.78 mg/dL (ref 0.57–1.00)
Globulin, Total: 2.4 g/dL (ref 1.5–4.5)
Glucose: 99 mg/dL (ref 65–99)
Potassium: 3.8 mmol/L (ref 3.5–5.2)
Sodium: 142 mmol/L (ref 134–144)
Total Protein: 6.6 g/dL (ref 6.0–8.5)
eGFR: 83 mL/min/{1.73_m2} (ref >59)

## 2020-10-20 LAB — CBC WITH DIFFERENTIAL/PLATELET
Basophils Absolute: 0 10*3/uL (ref 0.0–0.2)
Basos: 1 %
EOS (ABSOLUTE): 0.3 10*3/uL (ref 0.0–0.4)
Eos: 5 %
Hematocrit: 40.2 % (ref 34.0–46.6)
Hemoglobin: 13.2 g/dL (ref 11.1–15.9)
Immature Grans (Abs): 0 10*3/uL (ref 0.0–0.1)
Immature Granulocytes: 0 %
Lymphocytes Absolute: 1 10*3/uL (ref 0.7–3.1)
Lymphs: 20 %
MCH: 31.4 pg (ref 26.6–33.0)
MCHC: 32.8 g/dL (ref 31.5–35.7)
MCV: 96 fL (ref 79–97)
Monocytes Absolute: 0.5 10*3/uL (ref 0.1–0.9)
Monocytes: 10 %
Neutrophils Absolute: 3.3 10*3/uL (ref 1.4–7.0)
Neutrophils: 64 %
Platelets: 173 10*3/uL (ref 150–450)
RBC: 4.21 x10E6/uL (ref 3.77–5.28)
RDW: 12.2 % (ref 11.7–15.4)
WBC: 5.1 10*3/uL (ref 3.4–10.8)

## 2020-10-20 LAB — LIPID PANEL
Chol/HDL Ratio: 2.2 ratio (ref 0.0–4.4)
Cholesterol, Total: 146 mg/dL (ref 100–199)
HDL: 65 mg/dL (ref >39)
LDL Chol Calc (NIH): 70 mg/dL (ref 0–99)
Triglycerides: 52 mg/dL (ref 0–149)
VLDL Cholesterol Cal: 11 mg/dL (ref 5–40)

## 2020-10-20 MED ORDER — ESCITALOPRAM OXALATE 10 MG PO TABS: 10.0000 mg | ORAL_TABLET | Freq: Every day | ORAL | 0 refills | Status: DC

## 2020-10-20 NOTE — Patient Instructions (Signed)
Follow up in 6 weeks   [email or web address]">  Generalized Anxiety Disorder, Adult Generalized anxiety disorder (GAD) is a mental health condition. Unlike normal worries, anxiety related to GAD is not triggered by a specific event. These worries do not fade or get better with time. GAD interferes with relationships, work, and school. GAD symptoms can vary from mild to severe. People with severe GAD can have intense waves of anxiety with physical symptoms that are similar to panic attacks. What are the causes? The exact cause of GAD is not known, but the following are believed to have an impact:  Differences in natural brain chemicals.  Genes passed down from parents to children.  Differences in the way threats are perceived.  Development during childhood.  Personality. What increases the risk? The following factors may make you more likely to develop this condition:  Being female.  Having a family history of anxiety disorders.  Being very shy.  Experiencing very stressful life events, such as the death of a loved one.  Having a very stressful family environment. What are the signs or symptoms? People with GAD often worry excessively about many things in their lives, such as their health and family. Symptoms may also include:  Mental and emotional symptoms: ? Worrying excessively about natural disasters. ? Fear of being late. ? Difficulty concentrating. ? Fears that others are judging your performance.  Physical symptoms: ? Fatigue. ? Headaches, muscle tension, muscle twitches, trembling, or feeling shaky. ? Feeling like your heart is pounding or beating very fast. ? Feeling out of breath or like you cannot take a deep breath. ? Having trouble falling asleep or staying asleep, or experiencing restlessness. ? Sweating. ? Nausea, diarrhea, or irritable bowel syndrome (IBS).  Behavioral symptoms: ? Experiencing erratic moods or irritability. ? Avoidance of new  situations. ? Avoidance of people. ? Extreme difficulty making decisions. How is this diagnosed? This condition is diagnosed based on your symptoms and medical history. You will also have a physical exam. Your health care provider may perform tests to rule out other possible causes of your symptoms. To be diagnosed with GAD, a person must have anxiety that:  Is out of his or her control.  Affects several different aspects of his or her life, such as work and relationships.  Causes distress that makes him or her unable to take part in normal activities.  Includes at least three symptoms of GAD, such as restlessness, fatigue, trouble concentrating, irritability, muscle tension, or sleep problems. Before your health care provider can confirm a diagnosis of GAD, these symptoms must be present more days than they are not, and they must last for 6 months or longer. How is this treated? This condition may be treated with:  Medicine. Antidepressant medicine is usually prescribed for long-term daily control. Anti-anxiety medicines may be added in severe cases, especially when panic attacks occur.  Talk therapy (psychotherapy). Certain types of talk therapy can be helpful in treating GAD by providing support, education, and guidance. Options include: ? Cognitive behavioral therapy (CBT). People learn coping skills and self-calming techniques to ease their physical symptoms. They learn to identify unrealistic thoughts and behaviors and to replace them with more appropriate thoughts and behaviors. ? Acceptance and commitment therapy (ACT). This treatment teaches people how to be mindful as a way to cope with unwanted thoughts and feelings. ? Biofeedback. This process trains you to manage your body's response (physiological response) through breathing techniques and relaxation methods. You will work with a Transport planner while  machines are used to monitor your physical symptoms.  Stress management techniques.  These include yoga, meditation, and exercise. A mental health specialist can help determine which treatment is best for you. Some people see improvement with one type of therapy. However, other people require a combination of therapies.   Follow these instructions at home: Lifestyle  Maintain a consistent routine and schedule.  Anticipate stressful situations. Create a plan, and allow extra time to work with your plan.  Practice stress management or self-calming techniques that you have learned from your therapist or your health care provider. General instructions  Take over-the-counter and prescription medicines only as told by your health care provider.  Understand that you are likely to have setbacks. Accept this and be kind to yourself as you persist to take better care of yourself.  Recognize and accept your accomplishments, even if you judge them as small.  Keep all follow-up visits as told by your health care provider. This is important. Contact a health care provider if:  Your symptoms do not get better.  Your symptoms get worse.  You have signs of depression, such as: ? A persistently sad or irritable mood. ? Loss of enjoyment in activities that used to bring you joy. ? Change in weight or eating. ? Changes in sleeping habits. ? Avoiding friends or family members. ? Loss of energy for normal tasks. ? Feelings of guilt or worthlessness. Get help right away if:  You have serious thoughts about hurting yourself or others. If you ever feel like you may hurt yourself or others, or have thoughts about taking your own life, get help right away. Go to your nearest emergency department or:  Call your local emergency services (911 in the [place]  Call a suicide crisis helpline, such as the [facility] at 402 442 5420. This is open 24 hours a day in the [place]  Text the Crisis Text Line at 854-090-7634 (in the [place].). Summary  Generalized anxiety disorder  (GAD) is a mental health condition that involves worry that is not triggered by a specific event.  People with GAD often worry excessively about many things in their lives, such as their health and family.  GAD may cause symptoms such as restlessness, trouble concentrating, sleep problems, frequent sweating, nausea, diarrhea, headaches, and trembling or muscle twitching.  A mental health specialist can help determine which treatment is best for you. Some people see improvement with one type of therapy. However, other people require a combination of therapies. This information is not intended to replace advice given to you by your health care provider. Make sure you discuss any questions you have with your health care provider. Document Revised: 05/26/2019 Document Reviewed: 05/26/2019 Elsevier Patient Education  [place]. http://APA.org/depression-guideline"> https://clinicalkey.com"> http://point-of-care.elsevierperformancemanager.com/skills/"> http://point-of-care.elsevierperformancemanager.com">  Managing Depression, Adult Depression is a mental health condition that affects your thoughts, feelings, and actions. Being diagnosed with depression can bring you relief if you did not know why you have felt or behaved a certain way. It could also leave you feeling overwhelmed with uncertainty about your future. Preparing yourself to manage your symptoms can help you feel more positive about your future. How to manage lifestyle changes Managing stress Stress is your body's reaction to life changes and events, both good and bad. Stress can add to your feelings of depression. Learning to manage your stress can help lessen your feelings of depression. Try some of the following approaches to reducing your stress (stress reduction techniques):  Listen to music that  you enjoy and that inspires you.  Try using a meditation app or take a meditation class.  Develop a practice that helps you  connect with your spiritual self. Walk in nature, pray, or go to a place of worship.  Do some deep breathing. To do this, inhale slowly through your nose. Pause at the top of your inhale for a few seconds and then exhale slowly, letting your muscles relax.  Practice yoga to help relax and work your muscles. Choose a stress reduction technique that suits your lifestyle and personality. These techniques take time and practice to develop. Set aside 5-15 minutes a day to do them. Therapists can offer training in these techniques. Other things you can do to manage stress include:  Keeping a stress diary.  Knowing your limits and saying no when you think something is too much.  Paying attention to how you react to certain situations. You may not be able to control everything, but you can change your reaction.  Adding humor to your life by watching funny films or TV shows.  Making time for activities that you enjoy and that relax you.   Medicines Medicines, such as antidepressants, are often a part of treatment for depression.  Talk with your pharmacist or health care provider about all the medicines, supplements, and herbal products that you take, their possible side effects, and what medicines and other products are safe to take together.  Make sure to report any side effects you may have to your health care provider. Relationships Your health care provider may suggest family therapy, couples therapy, or individual therapy as part of your treatment. How to recognize changes Everyone responds differently to treatment for depression. As you recover from depression, you may start to:  Have more interest in doing activities.  Feel less hopeless.  Have more energy.  Overeat less often, or have a better appetite.  Have better mental focus. It is important to recognize if your depression is not getting better or is getting worse. The symptoms you had in the beginning may return, such  as:  Tiredness (fatigue) or low energy.  Eating too much or too little.  Sleeping too much or too little.  Feeling restless, agitated, or hopeless.  Trouble focusing or making decisions.  Unexplained physical complaints.  Feeling irritable, angry, or aggressive. If you or your family members notice these symptoms coming back, let your health care provider know right away. Follow these instructions at home: Activity  Try to get some form of exercise each day, such as walking, biking, swimming, or lifting weights.  Practice stress reduction techniques.  Engage your mind by taking a class or doing some volunteer work.   Lifestyle  Get the right amount and quality of sleep.  Cut down on using caffeine, tobacco, alcohol, and other potentially harmful substances.  Eat a healthy diet that includes plenty of vegetables, fruits, whole grains, low-fat dairy products, and lean protein. Do not eat a lot of foods that are high in solid fats, added sugars, or salt (sodium). General instructions  Take over-the-counter and prescription medicines only as told by your health care provider.  Keep all follow-up visits as told by your health care provider. This is important. Where to find support Talking to others Friends and family members can be sources of support and guidance. Talk to trusted friends or family members about your condition. Explain your symptoms to them, and let them know that you are working with a health care provider to  treat your depression. Tell friends and family members how they also can be helpful.   Finances  Find appropriate mental health providers that fit with your financial situation.  Talk with your health care provider about options to get reduced prices on your medicines. Where to find more information You can find support in your area from:  Anxiety and Depression Association of America (ADAA): www.adaa.org  Mental Health America:  www.mentalhealthamerica.net  [employer] on Mental Illness: www.nami.org Contact a health care provider if:  You stop taking your antidepressant medicines, and you have any of these symptoms: ? Nausea. ? Headache. ? Light-headedness. ? Chills and body aches. ? Not being able to sleep (insomnia).  You or your friends and family think your depression is getting worse. Get help right away if:  You have thoughts of hurting yourself or others. If you ever feel like you may hurt yourself or others, or have thoughts about taking your own life, get help right away. Go to your nearest emergency department or:  Call your local emergency services (911 in the [place]  Call a suicide crisis helpline, such as the [facility] at 346-632-5988. This is open 24 hours a day in the [place]  Text the Crisis Text Line at 818-062-6603 (in the [place].). Summary  If you are diagnosed with depression, preparing yourself to manage your symptoms is a good way to feel positive about your future.  Work with your health care provider on a management plan that includes stress reduction techniques, medicines (if applicable), therapy, and healthy lifestyle habits.  Keep talking with your health care provider about how your treatment is working.  If you have thoughts about taking your own life, call a suicide crisis helpline or text a crisis text line. This information is not intended to replace advice given to you by your health care provider. Make sure you discuss any questions you have with your health care provider. Document Revised: 06/16/2019 Document Reviewed: 06/16/2019 Elsevier Patient Education  2021 [place]. Hypertension, Adult Hypertension is another name for high blood pressure. High blood pressure forces your heart to work harder to pump blood. This can cause problems over time. There are two numbers in a blood pressure reading. There is a top number (systolic) over a bottom  number (diastolic). It is best to have a blood pressure that is below 120/80. Healthy choices can help lower your blood pressure, or you may need medicine to help lower it. What are the causes? The cause of this condition is not known. Some conditions may be related to high blood pressure. What increases the risk?  Smoking.  Having type 2 diabetes mellitus, high cholesterol, or both.  Not getting enough exercise or physical activity.  Being overweight.  Having too much fat, sugar, calories, or salt (sodium) in your diet.  Drinking too much alcohol.  Having long-term (chronic) kidney disease.  Having a family history of high blood pressure.  Age. Risk increases with age.  Race. You may be at higher risk if you are African American.  Gender. Men are at higher risk than women before age 21. After age 43, women are at higher risk than men.  Having obstructive sleep apnea.  Stress. What are the signs or symptoms?  High blood pressure may not cause symptoms. Very high blood pressure (hypertensive crisis) may cause: ? Headache. ? Feelings of worry or nervousness (anxiety). ? Shortness of breath. ? Nosebleed. ? A feeling of being sick to your stomach (nausea). ?  Throwing up (vomiting). ? Changes in how you see. ? Very bad chest pain. ? Seizures. How is this treated?  This condition is treated by making healthy lifestyle changes, such as: ? Eating healthy foods. ? Exercising more. ? Drinking less alcohol.  Your health care provider may prescribe medicine if lifestyle changes are not enough to get your blood pressure under control, and if: ? Your top number is above 130. ? Your bottom number is above 80.  Your personal target blood pressure may vary. Follow these instructions at home: Eating and drinking  If told, follow the DASH eating plan. To follow this plan: ? Fill one half of your plate at each meal with fruits and vegetables. ? Fill one fourth of your plate at  each meal with whole grains. Whole grains include whole-wheat pasta, brown rice, and whole-grain bread. ? Eat or drink low-fat dairy products, such as skim milk or low-fat yogurt. ? Fill one fourth of your plate at each meal with low-fat (lean) proteins. Low-fat proteins include fish, chicken without skin, eggs, beans, and tofu. ? Avoid fatty meat, cured and processed meat, or chicken with skin. ? Avoid pre-made or processed food.  Eat less than 1,500 mg of salt each day.  Do not drink alcohol if: ? Your doctor tells you not to drink. ? You are pregnant, may be pregnant, or are planning to become pregnant.  If you drink alcohol: ? Limit how much you use to:  0-1 drink a day for women.  0-2 drinks a day for men. ? Be aware of how much alcohol is in your drink. In the [place] one drink equals one 12 oz bottle of beer (355 mL), one 5 oz glass of wine (148 mL), or one 1 oz glass of hard liquor (44 mL).   Lifestyle  Work with your doctor to stay at a healthy weight or to lose weight. Ask your doctor what the best weight is for you.  Get at least 30 minutes of exercise most days of the week. This may include walking, swimming, or biking.  Get at least 30 minutes of exercise that strengthens your muscles (resistance exercise) at least 3 days a week. This may include lifting weights or doing Pilates.  Do not use any products that contain nicotine or tobacco, such as cigarettes, e-cigarettes, and chewing tobacco. If you need help quitting, ask your doctor.  Check your blood pressure at home as told by your doctor.  Keep all follow-up visits as told by your doctor. This is important.   Medicines  Take over-the-counter and prescription medicines only as told by your doctor. Follow directions carefully.  Do not skip doses of blood pressure medicine. The medicine does not work as well if you skip doses. Skipping doses also puts you at risk for problems.  Ask your doctor about side effects or  reactions to medicines that you should watch for. Contact a doctor if you:  Think you are having a reaction to the medicine you are taking.  Have headaches that keep coming back (recurring).  Feel dizzy.  Have swelling in your ankles.  Have trouble with your vision. Get help right away if you:  Get a very bad headache.  Start to feel mixed up (confused).  Feel weak or numb.  Feel faint.  Have very bad pain in your: ? Chest. ? Belly (abdomen).  Throw up more than once.  Have trouble breathing. Summary  Hypertension is another name for high blood pressure.  High blood pressure forces your heart to work harder to pump blood.  For most people, a normal blood pressure is less than 120/80.  Making healthy choices can help lower blood pressure. If your blood pressure does not get lower with healthy choices, you may need to take medicine. This information is not intended to replace advice given to you by your health care provider. Make sure you discuss any questions you have with your health care provider. Document Revised: 04/15/2018 Document Reviewed: 04/15/2018 Elsevier Patient Education  2021 [employer].

## 2020-10-20 NOTE — Assessment & Plan Note (Signed)
Worsening depression due to daughter being in ICU on life support.  Continue provide support and reassurance to patient.  Completed PHQ-9 and started patient on Lexapro 10 mg tablet daily.  Education provided with printed handouts given.   Follow-up in 6 weeks.

## 2020-10-20 NOTE — Progress Notes (Addendum)
Established Patient Office Visit  Subjective:  Patient ID: Anna Carney, female    DOB: 02-10-1951  Age: 70 y.o. MRN: [id number]  CC:  Chief Complaint  Patient presents with  . Medical Management of Chronic Issues    HPI Anna Carney  presents for follow up of hypertension. Patient was diagnosed in . The patient is tolerating the medication well without side effects. Compliance with treatment has been good; including taking medication as directed , maintains a healthy diet and regular exercise regimen , and following up as directed.  Current medication Cozaar 50 mg tablet by mouth daily    Depression: Patient complains of depression. She complains of depressed mood. Onset was approximately 1 month ago, gradually worsening since that time.  She denies current suicidal and homicidal plan or intent.   Family history significant for no psychiatric illness.Possible organic causes contributing are: current family situation.  Risk factors: none Previous treatment includes: patient has not yet started any medication for depression.     Past Medical History:  Diagnosis Date  . Anxiety   . Arthritis   . 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 Hypertension   . Shingles     Past Surgical History:  Procedure Laterality Date  . CHOLECYSTECTOMY    . MASTECTOMY W/ SENTINEL NODE BIOPSY Left 11/12/2018   Procedure: LEFT MASTECTOMY WITH LEFT AXILLARY SENTINEL LYMPH NODE BIOPSY;  Surgeon: Rolm Bookbinder, MD;  Location: [facility];  Service: General;  Laterality: Left;    Family History  Problem Relation Age of Onset  . Arthritis Mother   . Diabetes Mother   . Stroke Mother   . Asthma Father   . Colon cancer Brother   . Arthritis Daughter   . Thyroid disease Daughter   . Breast cancer Maternal Aunt     Social History   Socioeconomic History  . Marital status: Single    Spouse name: Not on file  . Number of children: Not on file  . Years of education: Not  on file  . Highest education level: Not on file  Occupational History  . Occupation: [employer]    Comment: in garden center  . Occupation: [employer]    Comment: cafeteria and bus driver  Tobacco Use  . Smoking status: Former Research scientist (life sciences)  . Smokeless tobacco: Never Used  Vaping Use  . Vaping Use: Never used  Substance and Sexual Activity  . Alcohol use: Yes    Comment: rarely  . Drug use: No  . Sexual activity: Not Currently    Birth control/protection: Post-menopausal  Other Topics Concern  . Not on file  Social History Narrative  . Not on file   Social Determinants of Health   Financial Resource Strain: Low Risk   . Difficulty of Paying Living Expenses: Not hard at all  Food Insecurity: No Food Insecurity  . Worried About Charity fundraiser in the Last Year: Never true  . Ran Out of Food in the Last Year: Never true  Transportation Needs: No Transportation Needs  . Lack of Transportation (Medical): No  . Lack of Transportation (Non-Medical): No  Physical Activity: Inactive  . Days of Exercise per Week: 0 days  . Minutes of Exercise per Session: 0 min  Stress: Stress Concern Present  . Feeling of Stress : To some extent  Social Connections: Socially Isolated  . Frequency of Communication with Friends and Family: More than three times a week  .  Frequency of Social Gatherings with Friends and Family: More than three times a week  . Attends Religious Services: Never  . Active Member of Clubs or Organizations: No  . Attends Archivist Meetings: Never  . Marital Status: Never married  Intimate Partner Violence: Not At Risk  . Fear of Current or Ex-Partner: No  . Emotionally Abused: No  . Physically Abused: No  . Sexually Abused: No    Outpatient Medications Prior to Visit  Medication Sig Dispense Refill  . acetaminophen (TYLENOL) 325 MG tablet Take 650 mg by mouth every 6 (six) hours as needed.    [name] anastrozole (ARIMIDEX) 1 MG tablet  Take 1 tablet (1 mg total) by mouth daily. 30 tablet 6  . Ascorbic Acid (VITAMIN C) 1000 MG tablet Take 2,000 mg by mouth daily.    . calcium carbonate (OS-CAL) 1250 (500 Ca) MG chewable tablet Chew 1 tablet by mouth 2 (two) times daily.     . celecoxib (CELEBREX) 200 MG capsule Take 200 mg by mouth daily.    . cholecalciferol (VITAMIN D3) 25 MCG (1000 UT) tablet Take 2,000 Units by mouth daily.    . diclofenac Sodium (VOLTAREN) 1 % GEL Apply 2 g topically 4 (four) times daily.     [name] Calcium (STOOL SOFTENER PO) Take by mouth.    . losartan (COZAAR) 50 MG tablet Take 1 tablet (50 mg total) by mouth daily. (Needs to be seen before next refill) 90 tablet 1  . Multiple Vitamin (MULTIVITAMIN) tablet Take 1 tablet by mouth daily.    . Zinc Sulfate (ZINC 15 PO) Take by mouth.     No facility-administered medications prior to visit.    Allergies  Allergen Reactions  . Sulfa Antibiotics Nausea Only    ROS Review of Systems  Constitutional: Negative.   HENT: Negative.   Eyes: Negative.   Respiratory: Negative.   Gastrointestinal: Negative.   Genitourinary: Negative.   Skin: Negative.   Neurological: Negative.   Psychiatric/Behavioral: Negative for self-injury, sleep disturbance and suicidal ideas. The patient is nervous/anxious.   All other systems reviewed and are negative.     Objective:    Physical Exam Vitals reviewed.  Constitutional:      Appearance: Normal appearance.  HENT:     Head: Normocephalic.     Nose: Nose normal. No congestion.  Eyes:     Conjunctiva/sclera: Conjunctivae normal.  Cardiovascular:     Rate and Rhythm: Normal rate.     Pulses: Normal pulses.     Heart sounds: Normal heart sounds.  Pulmonary:     Effort: Pulmonary effort is normal.     Breath sounds: Normal breath sounds.  Abdominal:     General: Bowel sounds are normal.  Musculoskeletal:        General: Normal range of motion.  Skin:    General: Skin is warm.  Neurological:      Mental Status: She is alert and oriented to person, place, and time.  Psychiatric:     Comments: Positive for depression and anxiety.     BP 128/68   Pulse 93   Temp 97.6 F (36.4 C)   Ht 5\' 4"  (1.626 m)   Wt 219 lb 12.8 oz (99.7 kg)   SpO2 95%   BMI 37.73 kg/m  Wt Readings from Last 3 Encounters:  10/20/20 219 lb 12.8 oz (99.7 kg)  07/26/20 219 lb 12.8 oz (99.7 kg)  06/20/20 219 lb 1.6 oz (99.4 kg)  Health Maintenance Due  Topic Date Due  . COVID-19 Vaccine (3 - Pfizer risk 4-dose series) 06/30/2020    There are no preventive care reminders to display for this patient.  Lab Results  Component Value Date   TSH 0.416 (L) 10/21/2018   Lab Results  Component Value Date   WBC 7.0 06/13/2020   HGB 13.2 06/13/2020   HCT 40.7 06/13/2020   MCV 96.7 06/13/2020   PLT 169 06/13/2020   Lab Results  Component Value Date   NA 141 06/13/2020   K 4.3 06/13/2020   CO2 29 06/13/2020   GLUCOSE 96 06/13/2020   BUN 26 (H) 06/13/2020   CREATININE 0.74 06/13/2020   BILITOT 0.5 06/13/2020   ALKPHOS 67 06/13/2020   AST 23 06/13/2020   ALT 18 06/13/2020   PROT 7.3 06/13/2020   ALBUMIN 4.0 06/13/2020   CALCIUM 10.1 06/13/2020   ANIONGAP 10 06/13/2020    Lab Results  Component Value Date   HGBA1C 5.1 10/21/2018   Flowsheet Row Office Visit from 10/20/2020 in [facility] [facility]  PHQ-9 Total Score 13     GAD 7 : Generalized Anxiety Score 10/20/2020  Nervous, Anxious, on Edge 3  Control/stop worrying 1  Worry too much - different things 1  Trouble relaxing 2  Restless 1  Easily annoyed or irritable 0  Afraid - awful might happen 2  Total GAD 7 Score 10  Anxiety Difficulty Not difficult at all      Assessment & Plan:   Problem List Items Addressed This Visit      Cardiovascular and Mediastinum   Essential hypertension - Primary    Blood pressure well controlled on Cozaar 50 mg tablet by mouth daily.  Continue low-sodium diet and exercise as  tolerated.  Labs completed today-CBC, CMP, lipid panel-results pending.  Education provided to patient with printed handouts given.      Relevant Orders   CBC with Differential   Comprehensive metabolic panel   Lipid Panel     Other   Anxiety disorder due to medical condition   Relevant Medications   escitalopram (LEXAPRO) 10 MG tablet   Anxiety    Uncontrolled anxiety due to daughter being sick.  Started Lexapro 10 mg tablet follow-up in 6 weeks.  Rx sent to pharmacy.      Relevant Medications   escitalopram (LEXAPRO) 10 MG tablet   Depression, major, single episode, moderate (HCC)    Worsening depression due to daughter being in ICU on life support.  Continue provide support and reassurance to patient.  Completed PHQ-9 and started patient on Lexapro 10 mg tablet daily.  Education provided with printed handouts given.   Follow-up in 6 weeks.      Relevant Medications   escitalopram (LEXAPRO) 10 MG tablet      Meds ordered this encounter  Medications  . escitalopram (LEXAPRO) 10 MG tablet    Sig: Take 1 tablet (10 mg total) by mouth daily.    Dispense:  60 tablet    Refill:  0    Order Specific Question:   Supervising Provider    Answer:   Janora Norlander [[id number]]    Follow-up: Return in about 6 weeks (around 12/01/2020).    Ivy Lynn, NP

## 2020-10-20 NOTE — Assessment & Plan Note (Signed)
Blood pressure well controlled on Cozaar 50 mg tablet by mouth daily.  Continue low-sodium diet and exercise as tolerated.  Labs completed today-CBC, CMP, lipid panel-results pending.  Education provided to patient with printed handouts given.

## 2020-10-20 NOTE — Assessment & Plan Note (Signed)
Uncontrolled anxiety due to daughter being sick.  Started Lexapro 10 mg tablet follow-up in 6 weeks.  Rx sent to pharmacy.

## 2020-10-24 ENCOUNTER — Ambulatory Visit: Payer: BC Managed Care – PPO | Admitting: Nurse Practitioner

## 2020-10-27 ENCOUNTER — Ambulatory Visit: Payer: BC Managed Care – PPO | Admitting: Nurse Practitioner

## 2020-12-04 ENCOUNTER — Ambulatory Visit: Payer: Medicare Other | Admitting: Nurse Practitioner

## 2020-12-04 ENCOUNTER — Encounter: Payer: Self-pay | Admitting: Nurse Practitioner

## 2020-12-04 ENCOUNTER — Other Ambulatory Visit: Payer: Self-pay

## 2020-12-04 VITALS — BP 129/64 | HR 62 | Temp 98.1°F | Ht 64.0 in | Wt 223.0 lb

## 2020-12-04 DIAGNOSIS — F419 Anxiety disorder, unspecified: Secondary | ICD-10-CM

## 2020-12-04 DIAGNOSIS — F321 Major depressive disorder, single episode, moderate: Secondary | ICD-10-CM

## 2020-12-04 DIAGNOSIS — F320 Major depressive disorder, single episode, mild: Secondary | ICD-10-CM | POA: Diagnosis not present

## 2020-12-04 NOTE — Assessment & Plan Note (Signed)
No worsening signs or symptoms of anxiety.  No changes to current medication dose.  Education provided to patient with printed handouts given.  We will see patient back in 3 months.  At that time we will reconsider decreasing Lexapro to 5 mg tablet.

## 2020-12-04 NOTE — Progress Notes (Signed)
Established Patient Office Visit  Subjective:  Patient ID: Anna Carney, female    DOB: 1951-05-26  Age: 70 y.o. MRN: [id number]  CC:  Chief Complaint  Patient presents with  . Depression    HPI Anna Carney presents for Depression: Patient complains of depression. She complains of depressed mood. Onset was approximately 2 years ago, gradually improving since that time.  She denies current suicidal and homicidal plan or intent.   Family history significant for no psychiatric illness.Possible organic causes contributing are: none.  Risk factors: previous episode of depression Previous treatment includes Lexapro. She complains of the following side effects from the treatment: none.  Anxiety: Patient complains of anxiety disorder.  She has the following symptoms: none. Onset of symptoms was approximately 2 years ago, completely resolved since that time. She denies current suicidal and homicidal ideation. Family history significant for no psychiatric illness.Possible organic causes contributing are: none. Risk factors: previous episode of depression Previous treatment includes Lexapro.  She complains of the following side effects from the treatment: none.   Past Medical History:  Diagnosis Date  . Anxiety   . Arthritis   . 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 Hypertension   . Shingles     Past Surgical History:  Procedure Laterality Date  . CHOLECYSTECTOMY    . MASTECTOMY W/ SENTINEL NODE BIOPSY Left 11/12/2018   Procedure: LEFT MASTECTOMY WITH LEFT AXILLARY SENTINEL LYMPH NODE BIOPSY;  Surgeon: Rolm Bookbinder, MD;  Location: [place];  Service: General;  Laterality: Left;    Family History  Problem Relation Age of Onset  . Arthritis Mother   . Diabetes Mother   . Stroke Mother   . Asthma Father   . Colon cancer Brother   . Arthritis Daughter   . Thyroid disease Daughter   . Breast cancer Maternal Aunt     Social History   Socioeconomic  History  . Marital status: Single    Spouse name: Not on file  . Number of children: Not on file  . Years of education: Not on file  . Highest education level: Not on file  Occupational History  . Occupation: [employer]    Comment: in garden center  . Occupation: [employer]    Comment: cafeteria and bus driver  Tobacco Use  . Smoking status: Former Research scientist (life sciences)  . Smokeless tobacco: Never Used  Vaping Use  . Vaping Use: Never used  Substance and Sexual Activity  . Alcohol use: Yes    Comment: rarely  . Drug use: No  . Sexual activity: Not Currently    Birth control/protection: Post-menopausal  Other Topics Concern  . Not on file  Social History Narrative  . Not on file   Social Determinants of Health   Financial Resource Strain: Low Risk   . Difficulty of Paying Living Expenses: Not hard at all  Food Insecurity: No Food Insecurity  . Worried About Charity fundraiser in the Last Year: Never true  . Ran Out of Food in the Last Year: Never true  Transportation Needs: No Transportation Needs  . Lack of Transportation (Medical): No  . Lack of Transportation (Non-Medical): No  Physical Activity: Inactive  . Days of Exercise per Week: 0 days  . Minutes of Exercise per Session: 0 min  Stress: Stress Concern Present  . Feeling of Stress : To some extent  Social Connections: Socially Isolated  . Frequency of Communication with Friends and Family: More  than three times a week  . Frequency of Social Gatherings with Friends and Family: More than three times a week  . Attends Religious Services: Never  . Active Member of Clubs or Organizations: No  . Attends Archivist Meetings: Never  . Marital Status: Never married  Intimate Partner Violence: Not At Risk  . Fear of Current or Ex-Partner: No  . Emotionally Abused: No  . Physically Abused: No  . Sexually Abused: No    Outpatient Medications Prior to Visit  Medication Sig Dispense Refill  .  acetaminophen (TYLENOL) 325 MG tablet Take 650 mg by mouth every 6 (six) hours as needed.    [name] anastrozole (ARIMIDEX) 1 MG tablet Take 1 tablet (1 mg total) by mouth daily. 30 tablet 6  . Ascorbic Acid (VITAMIN C) 1000 MG tablet Take 2,000 mg by mouth daily.    . calcium carbonate (OS-CAL) 1250 (500 Ca) MG chewable tablet Chew 1 tablet by mouth 2 (two) times daily.     . celecoxib (CELEBREX) 200 MG capsule Take 200 mg by mouth daily.    . cholecalciferol (VITAMIN D3) 25 MCG (1000 UT) tablet Take 2,000 Units by mouth daily.    . diclofenac Sodium (VOLTAREN) 1 % GEL Apply 2 g topically 4 (four) times daily.     [name] Calcium (STOOL SOFTENER PO) Take by mouth.    . escitalopram (LEXAPRO) 10 MG tablet Take 1 tablet (10 mg total) by mouth daily. 60 tablet 0  . losartan (COZAAR) 50 MG tablet Take 1 tablet (50 mg total) by mouth daily. (Needs to be seen before next refill) 90 tablet 1  . Multiple Vitamin (MULTIVITAMIN) tablet Take 1 tablet by mouth daily.    . Zinc Sulfate (ZINC 15 PO) Take by mouth.     No facility-administered medications prior to visit.    Allergies  Allergen Reactions  . Sulfa Antibiotics Nausea Only    ROS Review of Systems  HENT: Negative.   Eyes: Negative.   Respiratory: Negative.   Gastrointestinal: Negative.   Genitourinary: Negative.   Musculoskeletal: Negative.   Skin: Negative.   Psychiatric/Behavioral: Negative for self-injury, sleep disturbance and suicidal ideas. The patient is not nervous/anxious.   All other systems reviewed and are negative.     Objective:    Physical Exam Vitals reviewed.  Constitutional:      Appearance: Normal appearance.  HENT:     Head: Normocephalic.     Nose: Nose normal.  Eyes:     Conjunctiva/sclera: Conjunctivae normal.  Cardiovascular:     Rate and Rhythm: Normal rate and regular rhythm.     Pulses: Normal pulses.     Heart sounds: Normal heart sounds.  Pulmonary:     Effort: Pulmonary effort is normal.      Breath sounds: Normal breath sounds.  Abdominal:     General: Bowel sounds are normal.  Skin:    General: Skin is warm.  Neurological:     Mental Status: She is alert and oriented to person, place, and time.  Psychiatric:        Mood and Affect: Mood normal.        Behavior: Behavior normal.     BP 129/64   Pulse 62   Temp 98.1 F (36.7 C) (Temporal)   Ht 5\' 4"  (1.626 m)   Wt 223 lb (101.2 kg)   SpO2 95%   BMI 38.28 kg/m  Wt Readings from Last 3 Encounters:  12/04/20 223 lb (101.2  kg)  10/20/20 219 lb 12.8 oz (99.7 kg)  07/26/20 219 lb 12.8 oz (99.7 kg)     There are no preventive care reminders to display for this patient.  There are no preventive care reminders to display for this patient.  Lab Results  Component Value Date   TSH 0.416 (L) 10/21/2018   Lab Results  Component Value Date   WBC 5.1 10/20/2020   HGB 13.2 10/20/2020   HCT 40.2 10/20/2020   MCV 96 10/20/2020   PLT 173 10/20/2020   Lab Results  Component Value Date   NA 142 10/20/2020   K 3.8 10/20/2020   CO2 26 10/20/2020   GLUCOSE 99 10/20/2020   BUN 17 10/20/2020   CREATININE 0.78 10/20/2020   BILITOT 0.4 10/20/2020   ALKPHOS 91 10/20/2020   AST 20 10/20/2020   ALT 13 10/20/2020   PROT 6.6 10/20/2020   ALBUMIN 4.2 10/20/2020   CALCIUM 9.3 10/20/2020   ANIONGAP 10 06/13/2020   Lab Results  Component Value Date   CHOL 146 10/20/2020   Lab Results  Component Value Date   HDL 65 10/20/2020   Lab Results  Component Value Date   LDLCALC 70 10/20/2020   Lab Results  Component Value Date   TRIG 52 10/20/2020   Lab Results  Component Value Date   CHOLHDL 2.2 10/20/2020   Lab Results  Component Value Date   HGBA1C 5.1 10/21/2018   Flowsheet Row Office Visit from 12/04/2020 in [facility]  PHQ-9 Total Score 3     GAD 7 : Generalized Anxiety Score 12/04/2020 10/20/2020  Nervous, Anxious, on Edge 0 3  Control/stop worrying 0 1  Worry too much -  different things 0 1  Trouble relaxing 0 2  Restless 0 1  Easily annoyed or irritable 0 0  Afraid - awful might happen 0 2  Total GAD 7 Score 0 10  Anxiety Difficulty Not difficult at all Not difficult at all      Assessment & Plan:   Problem List Items Addressed This Visit      Other   Depression, major, single episode, mild ([place])    Depression rapidly improving.  Patient will continue Lexapro 10 mg tablet by mouth daily and will follow-up in 3 months. Will reassess and reconsider decreasing Lexapro to 5 mg tablet in 3 months.  Education provided with printed handouts given.      Anxiety    No worsening signs or symptoms of anxiety.  No changes to current medication dose.  Education provided to patient with printed handouts given.  We will see patient back in 3 months.  At that time we will reconsider decreasing Lexapro to 5 mg tablet.      Depression, major, single episode, moderate ([place]) - Primary      No orders of the defined types were placed in this encounter.   Follow-up: Return in about 3 months (around 03/05/2021).    Ivy Lynn, NP

## 2020-12-04 NOTE — Patient Instructions (Signed)
Signs and symptoms of anxiety and depression well-controlled on current medication Lexapro 10 mg tablet by mouth daily.  No changes to current dose.  Follow-up in 3 months.  Managing Anxiety, Adult After being diagnosed with an anxiety disorder, you may be relieved to know why you have felt or behaved a certain way. You may also feel overwhelmed about the treatment ahead and what it will mean for your life. With care and support, you can manage this condition and recover from it. How to manage lifestyle changes Managing stress and anxiety Stress is your body's reaction to life changes and events, both good and bad. Most stress will last just a few hours, but stress can be ongoing and can lead to more than just stress. Although stress can play a major role in anxiety, it is not the same as anxiety. Stress is usually caused by something external, such as a deadline, test, or competition. Stress normally passes after the triggering event has ended.  Anxiety is caused by something internal, such as imagining a terrible outcome or worrying that something will go wrong that will devastate you. Anxiety often does not go away even after the triggering event is over, and it can become long-term (chronic) worry. It is important to understand the differences between stress and anxiety and to manage your stress effectively so that it does not lead to an anxious response. Talk with your health care provider or a counselor to learn more about reducing anxiety and stress. He or she may suggest tension reduction techniques, such as:  Music therapy. This can include creating or listening to music that you enjoy and that inspires you.  Mindfulness-based meditation. This involves being aware of your normal breaths while not trying to control your breathing. It can be done while sitting or walking.  Centering prayer. This involves focusing on a word, phrase, or sacred image that means something to you and brings you  peace.  Deep breathing. To do this, expand your stomach and inhale slowly through your nose. Hold your breath for 3-5 seconds. Then exhale slowly, letting your stomach muscles relax.  Self-talk. This involves identifying thought patterns that lead to anxiety reactions and changing those patterns.  Muscle relaxation. This involves tensing muscles and then relaxing them. Choose a tension reduction technique that suits your lifestyle and personality. These techniques take time and practice. Set aside 5-15 minutes a day to do them. Therapists can offer counseling and training in these techniques. The training to help with anxiety may be covered by some insurance plans. Other things you can do to manage stress and anxiety include:  Keeping a stress/anxiety diary. This can help you learn what triggers your reaction and then learn ways to manage your response.  Thinking about how you react to certain situations. You may not be able to control everything, but you can control your response.  Making time for activities that help you relax and not feeling guilty about spending your time in this way.  Visual imagery and yoga can help you stay calm and relax.   Medicines Medicines can help ease symptoms. Medicines for anxiety include:  Anti-anxiety drugs.  Antidepressants. Medicines are often used as a primary treatment for anxiety disorder. Medicines will be prescribed by a health care provider. When used together, medicines, psychotherapy, and tension reduction techniques may be the most effective treatment. Relationships Relationships can play a big part in helping you recover. Try to spend more time connecting with trusted friends and family members.  Consider going to couples counseling, taking family education classes, or going to family therapy. Therapy can help you and others better understand your condition. How to recognize changes in your anxiety Everyone responds differently to treatment for  anxiety. Recovery from anxiety happens when symptoms decrease and stop interfering with your daily activities at home or work. This may mean that you will start to:  Have better concentration and focus. Worry will interfere less in your daily thinking.  Sleep better.  Be less irritable.  Have more energy.  Have improved memory. It is important to recognize when your condition is getting worse. Contact your health care provider if your symptoms interfere with home or work and you feel like your condition is not improving. Follow these instructions at home: Activity  Exercise. Most adults should do the following: ? Exercise for at least 150 minutes each week. The exercise should increase your heart rate and make you sweat (moderate-intensity exercise). ? Strengthening exercises at least twice a week.  Get the right amount and quality of sleep. Most adults need 7-9 hours of sleep each night. Lifestyle  Eat a healthy diet that includes plenty of vegetables, fruits, whole grains, low-fat dairy products, and lean protein. Do not eat a lot of foods that are high in solid fats, added sugars, or salt.  Make choices that simplify your life.  Do not use any products that contain nicotine or tobacco, such as cigarettes, e-cigarettes, and chewing tobacco. If you need help quitting, ask your health care provider.  Avoid caffeine, alcohol, and certain over-the-counter cold medicines. These may make you feel worse. Ask your pharmacist which medicines to avoid.   General instructions  Take over-the-counter and prescription medicines only as told by your health care provider.  Keep all follow-up visits as told by your health care provider. This is important. Where to find support You can get help and support from these sources:  Self-help groups.  Online and [employer].  A trusted spiritual leader.  Couples counseling.  Family education classes.  Family therapy. Where to  find more information You may find that joining a support group helps you deal with your anxiety. The following sources can help you locate counselors or support groups near you:  [place]: www.mentalhealthamerica.net  Anxiety and Depression Association of [place] (ADAA): https://www.clark.net/  National Alliance on Mental Illness (NAMI): www.nami.org Contact a health care provider if you:  Have a hard time staying focused or finishing daily tasks.  Spend many hours a day feeling worried about everyday life.  Become exhausted by worry.  Start to have headaches, feel tense, or have nausea.  Urinate more than normal.  Have diarrhea. Get help right away if you have:  A racing heart and shortness of breath.  Thoughts of hurting yourself or others. If you ever feel like you may hurt yourself or others, or have thoughts about taking your own life, get help right away. You can go to your nearest emergency department or call:  Your local emergency services (911 in the [place]  A suicide crisis helpline, such as the [place] at (629) 239-1459. This is open 24 hours a day. Summary  Taking steps to learn and use tension reduction techniques can help calm you and help prevent triggering an anxiety reaction.  When used together, medicines, psychotherapy, and tension reduction techniques may be the most effective treatment.  Family, friends, and partners can play a big part in helping you recover from an anxiety disorder.  This information is not intended to replace advice given to you by your health care provider. Make sure you discuss any questions you have with your health care provider. Document Revised: 01/05/2019 Document Reviewed: 01/05/2019 Elsevier Patient Education  [place]. http://APA.org/depression-guideline"> https://clinicalkey.com"> http://point-of-care.elsevierperformancemanager.com/skills/">  http://point-of-care.elsevierperformancemanager.com">  Managing Depression, Adult Depression is a mental health condition that affects your thoughts, feelings, and actions. Being diagnosed with depression can bring you relief if you did not know why you have felt or behaved a certain way. It could also leave you feeling overwhelmed with uncertainty about your future. Preparing yourself to manage your symptoms can help you feel more positive about your future. How to manage lifestyle changes Managing stress Stress is your body's reaction to life changes and events, both good and bad. Stress can add to your feelings of depression. Learning to manage your stress can help lessen your feelings of depression. Try some of the following approaches to reducing your stress (stress reduction techniques):  Listen to music that you enjoy and that inspires you.  Try using a meditation app or take a meditation class.  Develop a practice that helps you connect with your spiritual self. Walk in nature, pray, or go to a place of worship.  Do some deep breathing. To do this, inhale slowly through your nose. Pause at the top of your inhale for a few seconds and then exhale slowly, letting your muscles relax.  Practice yoga to help relax and work your muscles. Choose a stress reduction technique that suits your lifestyle and personality. These techniques take time and practice to develop. Set aside 5-15 minutes a day to do them. Therapists can offer training in these techniques. Other things you can do to manage stress include:  Keeping a stress diary.  Knowing your limits and saying no when you think something is too much.  Paying attention to how you react to certain situations. You may not be able to control everything, but you can change your reaction.  Adding humor to your life by watching funny films or TV shows.  Making time for activities that you enjoy and that relax you.   Medicines Medicines, such  as antidepressants, are often a part of treatment for depression.  Talk with your pharmacist or health care provider about all the medicines, supplements, and herbal products that you take, their possible side effects, and what medicines and other products are safe to take together.  Make sure to report any side effects you may have to your health care provider. Relationships Your health care provider may suggest family therapy, couples therapy, or individual therapy as part of your treatment. How to recognize changes Everyone responds differently to treatment for depression. As you recover from depression, you may start to:  Have more interest in doing activities.  Feel less hopeless.  Have more energy.  Overeat less often, or have a better appetite.  Have better mental focus. It is important to recognize if your depression is not getting better or is getting worse. The symptoms you had in the beginning may return, such as:  Tiredness (fatigue) or low energy.  Eating too much or too little.  Sleeping too much or too little.  Feeling restless, agitated, or hopeless.  Trouble focusing or making decisions.  Unexplained physical complaints.  Feeling irritable, angry, or aggressive. If you or your family members notice these symptoms coming back, let your health care provider know right away. Follow these instructions at home: Activity  Try to get some  form of exercise each day, such as walking, biking, swimming, or lifting weights.  Practice stress reduction techniques.  Engage your mind by taking a class or doing some volunteer work.   Lifestyle  Get the right amount and quality of sleep.  Cut down on using caffeine, tobacco, alcohol, and other potentially harmful substances.  Eat a healthy diet that includes plenty of vegetables, fruits, whole grains, low-fat dairy products, and lean protein. Do not eat a lot of foods that are high in solid fats, added sugars, or salt  (sodium). General instructions  Take over-the-counter and prescription medicines only as told by your health care provider.  Keep all follow-up visits as told by your health care provider. This is important. Where to find support Talking to others Friends and family members can be sources of support and guidance. Talk to trusted friends or family members about your condition. Explain your symptoms to them, and let them know that you are working with a health care provider to treat your depression. Tell friends and family members how they also can be helpful.   Finances  Find appropriate mental health providers that fit with your financial situation.  Talk with your health care provider about options to get reduced prices on your medicines. Where to find more information You can find support in your area from:  Anxiety and Depression Association of America (ADAA): www.adaa.org  Mental Health America: www.mentalhealthamerica.net  [employer] on Mental Illness: www.nami.org Contact a health care provider if:  You stop taking your antidepressant medicines, and you have any of these symptoms: ? Nausea. ? Headache. ? Light-headedness. ? Chills and body aches. ? Not being able to sleep (insomnia).  You or your friends and family think your depression is getting worse. Get help right away if:  You have thoughts of hurting yourself or others. If you ever feel like you may hurt yourself or others, or have thoughts about taking your own life, get help right away. Go to your nearest emergency department or:  Call your local emergency services (911 in the [place]  Call a suicide crisis helpline, such as the [place] at (316)197-9026. This is open 24 hours a day in the [place]  Text the Crisis Text Line at (515) 607-3536 (in the [place].). Summary  If you are diagnosed with depression, preparing yourself to manage your symptoms is a good way to feel positive about  your future.  Work with your health care provider on a management plan that includes stress reduction techniques, medicines (if applicable), therapy, and healthy lifestyle habits.  Keep talking with your health care provider about how your treatment is working.  If you have thoughts about taking your own life, call a suicide crisis helpline or text a crisis text line. This information is not intended to replace advice given to you by your health care provider. Make sure you discuss any questions you have with your health care provider. Document Revised: 06/16/2019 Document Reviewed: 06/16/2019 Elsevier Patient Education  2021 [employer].

## 2020-12-04 NOTE — Assessment & Plan Note (Addendum)
Depression rapidly improving.  Patient will continue Lexapro 10 mg tablet by mouth daily and will follow-up in 3 months. Will reassess and reconsider decreasing Lexapro to 5 mg tablet in 3 months.  Education provided with printed handouts given.

## 2020-12-26 ENCOUNTER — Ambulatory Visit (HOSPITAL_COMMUNITY): Payer: BC Managed Care – PPO | Admitting: Hematology

## 2020-12-29 ENCOUNTER — Other Ambulatory Visit: Payer: Self-pay | Admitting: *Deleted

## 2020-12-29 DIAGNOSIS — F321 Major depressive disorder, single episode, moderate: Secondary | ICD-10-CM

## 2020-12-29 DIAGNOSIS — F064 Anxiety disorder due to known physiological condition: Secondary | ICD-10-CM

## 2020-12-29 MED ORDER — ESCITALOPRAM OXALATE 10 MG PO TABS: 10.0000 mg | ORAL_TABLET | Freq: Every day | ORAL | 1 refills | Status: DC

## 2021-01-04 ENCOUNTER — Other Ambulatory Visit (HOSPITAL_COMMUNITY): Payer: Self-pay | Admitting: Hematology

## 2021-01-04 DIAGNOSIS — Z1231 Encounter for screening mammogram for malignant neoplasm of breast: Secondary | ICD-10-CM

## 2021-01-17 ENCOUNTER — Other Ambulatory Visit (HOSPITAL_COMMUNITY): Payer: Self-pay | Admitting: Hematology

## 2021-01-17 DIAGNOSIS — Z170 Estrogen receptor positive status [ER+]: Secondary | ICD-10-CM

## 2021-01-17 DIAGNOSIS — C50512 Malignant neoplasm of lower-outer quadrant of left female breast: Secondary | ICD-10-CM

## 2021-02-09 ENCOUNTER — Ambulatory Visit (HOSPITAL_COMMUNITY)
Admission: RE | Admit: 2021-02-09 | Discharge: 2021-02-09 | Disposition: A | Discharge status: Home / Self Care | Payer: BC Managed Care – PPO | Source: Ambulatory Visit | Attending: Hematology | Admitting: Hematology

## 2021-02-09 ENCOUNTER — Inpatient Hospital Stay (HOSPITAL_COMMUNITY): Payer: BC Managed Care – PPO | Attending: Hematology

## 2021-02-09 ENCOUNTER — Other Ambulatory Visit: Payer: Self-pay

## 2021-02-09 DIAGNOSIS — Z79811 Long term (current) use of aromatase inhibitors: Secondary | ICD-10-CM | POA: Diagnosis not present

## 2021-02-09 DIAGNOSIS — M8580 Other specified disorders of bone density and structure, unspecified site: Secondary | ICD-10-CM | POA: Diagnosis not present

## 2021-02-09 DIAGNOSIS — Z853 Personal history of malignant neoplasm of breast: Secondary | ICD-10-CM | POA: Insufficient documentation

## 2021-02-09 DIAGNOSIS — Z780 Asymptomatic menopausal state: Secondary | ICD-10-CM | POA: Diagnosis not present

## 2021-02-09 DIAGNOSIS — C50512 Malignant neoplasm of lower-outer quadrant of left female breast: Secondary | ICD-10-CM | POA: Diagnosis not present

## 2021-02-09 DIAGNOSIS — Z1231 Encounter for screening mammogram for malignant neoplasm of breast: Secondary | ICD-10-CM | POA: Diagnosis present

## 2021-02-09 DIAGNOSIS — Z170 Estrogen receptor positive status [ER+]: Secondary | ICD-10-CM | POA: Diagnosis not present

## 2021-02-09 DIAGNOSIS — Z13820 Encounter for screening for osteoporosis: Secondary | ICD-10-CM | POA: Diagnosis not present

## 2021-02-09 DIAGNOSIS — M8589 Other specified disorders of bone density and structure, multiple sites: Secondary | ICD-10-CM | POA: Diagnosis not present

## 2021-02-09 LAB — COMPREHENSIVE METABOLIC PANEL
ALT: 17 U/L (ref 0–44)
AST: 23 U/L (ref 15–41)
Albumin: 3.9 g/dL (ref 3.5–5.0)
Alkaline Phosphatase: 68 U/L (ref 38–126)
Anion gap: 6 (ref 5–15)
BUN: 19 mg/dL (ref 8–23)
CO2: 28 mmol/L (ref 22–32)
Calcium: 8.9 mg/dL (ref 8.9–10.3)
Chloride: 104 mmol/L (ref 98–111)
Creatinine, Ser: 0.66 mg/dL (ref 0.44–1.00)
GFR, Estimated: >60 mL/min (ref >60)
Glucose, Bld: 100 mg/dL — ABNORMAL HIGH (ref 70–99)
Potassium: 4 mmol/L (ref 3.5–5.1)
Sodium: 138 mmol/L (ref 135–145)
Total Bilirubin: 0.7 mg/dL (ref 0.3–1.2)
Total Protein: 6.8 g/dL (ref 6.5–8.1)

## 2021-02-09 LAB — CBC WITH DIFFERENTIAL/PLATELET
Abs Immature Granulocytes: 0.01 10*3/uL (ref 0.00–0.07)
Basophils Absolute: 0.1 10*3/uL (ref 0.0–0.1)
Basophils Relative: 1 %
Eosinophils Absolute: 0.3 10*3/uL (ref 0.0–0.5)
Eosinophils Relative: 5 %
HCT: 40.7 % (ref 36.0–46.0)
Hemoglobin: 13.1 g/dL (ref 12.0–15.0)
Immature Granulocytes: 0 %
Lymphocytes Relative: 23 %
Lymphs Abs: 1.2 10*3/uL (ref 0.7–4.0)
MCH: 31.4 pg (ref 26.0–34.0)
MCHC: 32.2 g/dL (ref 30.0–36.0)
MCV: 97.6 fL (ref 80.0–100.0)
Monocytes Absolute: 0.5 10*3/uL (ref 0.1–1.0)
Monocytes Relative: 10 %
Neutro Abs: 3.3 10*3/uL (ref 1.7–7.7)
Neutrophils Relative %: 61 %
Platelets: 147 10*3/uL — ABNORMAL LOW (ref 150–400)
RBC: 4.17 MIL/uL (ref 3.87–5.11)
RDW: 12.6 % (ref 11.5–15.5)
WBC: 5.4 10*3/uL (ref 4.0–10.5)
nRBC: 0 % (ref 0.0–0.2)

## 2021-02-09 LAB — VITAMIN D 25 HYDROXY (VIT D DEFICIENCY, FRACTURES): Vit D, 25-Hydroxy: 43.21 ng/mL (ref 30–100)

## 2021-02-10 LAB — CANCER ANTIGEN 15-3: CA 15-3: 21.2 U/mL (ref 0.0–25.0)

## 2021-02-14 NOTE — Progress Notes (Signed)
[name], [place] [address]   Patient Care Team: Ivy Lynn, NP as PCP - General (Nurse Practitioner)  SUMMARY OF ONCOLOGIC HISTORY: Oncology History  Malignant neoplasm of lower-outer quadrant of left breast of female, estrogen receptor positive ([place])  10/27/2018 Initial Diagnosis   Malignant neoplasm of lower-outer quadrant of left breast of female, estrogen receptor positive ([place])   12/10/2018 Cancer Staging   Staging form: Breast, AJCC 8th Edition - Clinical stage from 12/10/2018: Stage IIIB (cT4b, cN0, cM0, G2, ER+, PR+, HER2-, Oncotype DX score: 2) - Signed by Derek Jack, MD on 12/10/2018     CHIEF COMPLIANT: Follow up for left breast cancer   INTERVAL HISTORY: [name] is a 70 y.o. female here today for follow up of her left breast cancer.  Since her last visit, she denies any complaints Hot flashes are reasonably well controlled.  They mostly happen at night.  REVIEW OF SYSTEMS:    Review of Systems  Constitutional:  Positive for appetite change (70%) and fatigue (70%).  Endocrine: Positive for hot flashes (controlled with black cohosh).  All other systems reviewed and are negative.    ALLERGIES:   is allergic to sulfa antibiotics.   MEDICATIONS:  Current Outpatient Medications  Medication Sig Dispense Refill   acetaminophen (TYLENOL) 325 MG tablet Take 650 mg by mouth every 6 (six) hours as needed.     anastrozole (ARIMIDEX) 1 MG tablet Take 1 tablet by mouth once daily 30 tablet 6   Ascorbic Acid (VITAMIN C) 1000 MG tablet Take 2,000 mg by mouth daily.     calcium carbonate (OS-CAL) 1250 (500 Ca) MG chewable tablet Chew 1 tablet by mouth 2 (two) times daily.      celecoxib (CELEBREX) 200 MG capsule Take 200 mg by mouth daily.     cholecalciferol (VITAMIN D3) 25 MCG (1000 UT) tablet Take 2,000 Units by mouth daily.     diclofenac Sodium (VOLTAREN) 1 % GEL Apply 2 g topically 4 (four) times daily.       Docusate Calcium (STOOL SOFTENER PO) Take by mouth.     escitalopram (LEXAPRO) 10 MG tablet Take 1 tablet (10 mg total) by mouth daily. 60 tablet 1   losartan (COZAAR) 50 MG tablet Take 1 tablet (50 mg total) by mouth daily. (Needs to be seen before next refill) 90 tablet 1   Multiple Vitamin (MULTIVITAMIN) tablet Take 1 tablet by mouth daily.     Zinc Sulfate (ZINC 15 PO) Take by mouth.     No current facility-administered medications for this visit.     PHYSICAL EXAMINATION: Performance status (ECOG): 1 - Symptomatic but completely ambulatory  There were no vitals filed for this visit.  Wt Readings from Last 3 Encounters:  12/04/20 223 lb (101.2 kg)  10/20/20 219 lb 12.8 oz (99.7 kg)  07/26/20 219 lb 12.8 oz (99.7 kg)   Physical Exam Vitals reviewed.  Constitutional:      Appearance: Normal appearance. She is obese.  Cardiovascular:     Rate and Rhythm: Normal rate and regular rhythm.     Pulses: Normal pulses.     Heart sounds: Normal heart sounds.  Pulmonary:     Effort: Pulmonary effort is normal.     Breath sounds: Normal breath sounds.  Chest:  Breasts:    Right: Normal. No mass, skin change or tenderness.     Left: Absent. No mass, skin change or tenderness.  Abdominal:  Palpations: Abdomen is soft. There is no mass.     Tenderness: There is no abdominal tenderness.  Musculoskeletal:     Right lower leg: No edema.     Left lower leg: No edema.  Neurological:     General: No focal deficit present.     Mental Status: She is alert and oriented to person, place, and time.  Psychiatric:        Mood and Affect: Mood normal.        Behavior: Behavior normal.    Breast Exam Chaperone: Milinda Antis, MD     LABORATORY DATA:  I have reviewed the data as listed CMP Latest Ref Rng & Units 02/09/2021 10/20/2020 06/13/2020  Glucose 70 - 99 mg/dL 100(H) 99 96  BUN 8 - 23 mg/dL 19 17 26(H)  Creatinine 0.44 - 1.00 mg/dL 0.66 0.78 0.74  Sodium 135 - 145 mmol/L  138 142 141  Potassium 3.5 - 5.1 mmol/L 4.0 3.8 4.3  Chloride 98 - 111 mmol/L 104 103 102  CO2 22 - 32 mmol/L _0 Calcium 8.9 - 10.3 mg/dL 8.9 9.3 10.1  Total Protein 6.5 - 8.1 g/dL 6.8 6.6 7.3  Total Bilirubin 0.3 - 1.2 mg/dL 0.7 0.4 0.5  Alkaline Phos 38 - 126 U/L 68 91 67  AST 15 - 41 U/L _1 ALT 0 - 44 U/L _2 Lab Results  Component Value Date   CAN153 21.2 02/09/2021   CAN153 21.9 06/13/2020   CAN153 21.3 09/13/2019   Lab Results  Component Value Date   WBC 5.4 02/09/2021   HGB 13.1 02/09/2021   HCT 40.7 02/09/2021   MCV 97.6 02/09/2021   PLT 147 (L) 02/09/2021   NEUTROABS 3.3 02/09/2021    ASSESSMENT:   1.  Stage IIIb (T4N0) left breast IDC, ER/PR positive, HER-2 negative: -Biopsy of the left breast on 10/20/2018 showed IDC.  Left axillary core biopsy was negative. -Left mastectomy on 11/12/2018, 11 cm IDC, grade 2, margins negative, 0/14 lymph node positive, ER/PR positive, HER-2 2+ by IHC and negative by FISH, Ki-67 10%. -Oncotype DX recurrence score of 2. -Anastrozole started on 12/10/2018. -XRT from 02/01/2019-03/18/2019. -Right breast mammogram on 12/16/2019 shows BI-RADS 1.   2.  Osteopenia: -DEXA scan on 02/09/2019 shows T score of -2. 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   PLAN:  1.  Stage IIIb (T4N0) left breast IDC, ER/PR positive, HER-2 negative: No concern for breast cancer recurrence. No mammographic evidence of malignancy. Last mammogram BIRADS 1 neg. RTC in 6 months. Labs without concern for recurrence. CA 15-3 normal.   3.  Osteopenia:  Last bone density showed osteopenia. She should continue Ca and Vit D Cannot take bisphosphonates because of TMJ complications in the past.  Benay Pike MD

## 2021-02-15 ENCOUNTER — Inpatient Hospital Stay (HOSPITAL_COMMUNITY): Payer: BC Managed Care – PPO | Admitting: Hematology and Oncology

## 2021-02-15 ENCOUNTER — Encounter (HOSPITAL_COMMUNITY): Payer: Self-pay | Admitting: Hematology and Oncology

## 2021-02-15 ENCOUNTER — Other Ambulatory Visit: Payer: Self-pay

## 2021-02-15 VITALS — BP 157/80 | HR 71 | Temp 96.7°F | Resp 18 | Wt 223.8 lb

## 2021-02-15 DIAGNOSIS — C50512 Malignant neoplasm of lower-outer quadrant of left female breast: Secondary | ICD-10-CM

## 2021-02-15 DIAGNOSIS — Z170 Estrogen receptor positive status [ER+]: Secondary | ICD-10-CM | POA: Diagnosis not present

## 2021-02-16 ENCOUNTER — Other Ambulatory Visit (HOSPITAL_COMMUNITY): Payer: Self-pay

## 2021-02-16 DIAGNOSIS — Z170 Estrogen receptor positive status [ER+]: Secondary | ICD-10-CM

## 2021-02-20 ENCOUNTER — Other Ambulatory Visit: Payer: Self-pay

## 2021-02-20 DIAGNOSIS — I10 Essential (primary) hypertension: Secondary | ICD-10-CM

## 2021-02-20 MED ORDER — LOSARTAN POTASSIUM 50 MG PO TABS: 50.0000 mg | ORAL_TABLET | Freq: Every day | ORAL | 0 refills | Status: DC

## 2021-03-05 ENCOUNTER — Other Ambulatory Visit: Payer: Self-pay

## 2021-03-05 ENCOUNTER — Encounter: Payer: Self-pay | Admitting: Nurse Practitioner

## 2021-03-05 ENCOUNTER — Ambulatory Visit (INDEPENDENT_AMBULATORY_CARE_PROVIDER_SITE_OTHER): Payer: BC Managed Care – PPO | Admitting: Nurse Practitioner

## 2021-03-05 VITALS — BP 150/75 | HR 60 | Temp 97.4°F | Ht 64.0 in | Wt 224.0 lb

## 2021-03-05 DIAGNOSIS — F321 Major depressive disorder, single episode, moderate: Secondary | ICD-10-CM | POA: Diagnosis not present

## 2021-03-05 DIAGNOSIS — I10 Essential (primary) hypertension: Secondary | ICD-10-CM

## 2021-03-05 DIAGNOSIS — F419 Anxiety disorder, unspecified: Secondary | ICD-10-CM

## 2021-03-05 DIAGNOSIS — Z23 Encounter for immunization: Secondary | ICD-10-CM | POA: Diagnosis not present

## 2021-03-05 MED ORDER — ESCITALOPRAM OXALATE 5 MG PO TABS
5.0000 mg | ORAL_TABLET | Freq: Every day | ORAL | 1 refills | Status: DC
Start: 2021-03-05 — End: 2021-06-25

## 2021-03-05 NOTE — Assessment & Plan Note (Signed)
Blood pressure well controlled on current medication. Continue current diet, follow up in 3-6 months, labs completed at the cancer center 02/09/2021, results with  Normal range.

## 2021-03-05 NOTE — Progress Notes (Signed)
 Established Patient Office Visit  Subjective:  Patient ID: Anna Carney, female    DOB: 12/17/1950  Age: 70 y.o. MRN: [id number]  CC:  Chief Complaint  Patient presents with   Medical Management of Chronic Issues    HPI Tarita Andreoli for follow up of hypertension. Patient was diagnosed in 12/27/2019. The patient is tolerating the medication well without side effects. Compliance with treatment has been good; including taking medication as directed , maintains a healthy diet and regular exercise regimen , and following up as directed. Current medication, current medication Cozaar 50 mg tablet by mouth daily.  Anxiety, Follow-up  She was last seen for anxiety 4 months ago. Changes made at last visit include lexapro 10 mg by mouth daily   She reports good compliance with treatment. She reports excellent tolerance of treatment. She is not having side effects.   She feels her anxiety is mild and Improved since last visit.  Symptoms: No chest pain No difficulty concentrating  No dizziness No fatigue  No feelings of losing control Yes insomnia  No irritable No palpitations  No panic attacks No racing thoughts  No shortness of breath No sweating  No tremors/shakes    GAD-7 Results GAD-7 Generalized Anxiety Disorder Screening Tool 03/05/2021 12/04/2020 10/20/2020  1. Feeling Nervous, Anxious, or on Edge 0 0 3  2. Not Being Able to Stop or Control Worrying 0 0 1  3. Worrying Too Much About Different Things 0 0 1  4. Trouble Relaxing 0 0 2  5. Being So Restless it's Hard To Sit Still 0 0 1  6. Becoming Easily Annoyed or Irritable 0 0 0  7. Feeling Afraid As If Something Awful Might Happen 0 0 2  Total GAD-7 Score 0 0 10  Difficulty At Work, Home, or Getting  Along With Others? - Not difficult at all Not difficult at all    PHQ-9 Scores PHQ9 SCORE ONLY 03/05/2021 12/04/2020 12/04/2020  PHQ-9 Total Score 4 0 3   Anxiety, Follow-up  She was last seen for anxiety 4 months  ago. Changes made at last visit include Lexapro 10 mg by mouth daily.   She reports excellent compliance with treatment. She reports excellent tolerance of treatment. She is not having side effects.   She feels her anxiety is mild and Improved since last visit.  Symptoms: No chest pain No difficulty concentrating  No dizziness No fatigue  No feelings of losing control No insomnia  No irritable No palpitations  No panic attacks No racing thoughts  No shortness of breath No sweating  No tremors/shakes    GAD-7 Results GAD-7 Generalized Anxiety Disorder Screening Tool 03/05/2021 12/04/2020 10/20/2020  1. Feeling Nervous, Anxious, or on Edge 0 0 3  2. Not Being Able to Stop or Control Worrying 0 0 1  3. Worrying Too Much About Different Things 0 0 1  4. Trouble Relaxing 0 0 2  5. Being So Restless it's Hard To Sit Still 0 0 1  6. Becoming Easily Annoyed or Irritable 0 0 0  7. Feeling Afraid As If Something Awful Might Happen 0 0 2  Total GAD-7 Score 0 0 10  Difficulty At Work, Home, or Getting  Along With Others? - Not difficult at all Not difficult at all    PHQ-9 Scores PHQ9 SCORE ONLY 03/05/2021 12/04/2020 12/04/2020  PHQ-9 Total Score 4 0 3    ---------------------------------------------------------------------------------------------------   Past Medical History:  Diagnosis Date   Anxiety    Arthritis      Cancer ([place]) 10/2018   left breast IDC   Hypertension    Shingles     Past Surgical History:  Procedure Laterality Date   CHOLECYSTECTOMY     MASTECTOMY W/ SENTINEL NODE BIOPSY Left 11/12/2018   Procedure: LEFT MASTECTOMY WITH LEFT AXILLARY SENTINEL LYMPH NODE BIOPSY;  Surgeon: Rolm Bookbinder, MD;  Location: [facility];  Service: General;  Laterality: Left;    Family History  Problem Relation Age of Onset   Arthritis Mother    Diabetes Mother    Stroke Mother    Asthma Father    Colon cancer Brother    Arthritis Daughter    Thyroid disease  Daughter    Breast cancer Maternal Aunt     Social History   Socioeconomic History   Marital status: Single    Spouse name: Not on file   Number of children: Not on file   Years of education: Not on file   Highest education level: Not on file  Occupational History   Occupation: [employer]    Comment: in garden center   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  Tobacco Use   Smoking status: Former   Smokeless tobacco: Never  Scientific laboratory technician Use: Never used  Substance and Sexual Activity   Alcohol use: Yes    Comment: rarely   Drug use: No   Sexual activity: Not Currently    Birth control/protection: Post-menopausal  Other Topics Concern   Not on file  Social History Narrative   Not on file   Social Determinants of Health   Financial Resource Strain: Low Risk    Difficulty of Paying Living Expenses: Not hard at all  Food Insecurity: No Food Insecurity   Worried About Charity fundraiser in the Last Year: Never true   Arboriculturist in the Last Year: Never true  Transportation Needs: No Transportation Needs   Lack of Transportation (Medical): No   Lack of Transportation (Non-Medical): No  Physical Activity: Inactive   Days of Exercise per Week: 0 days   Minutes of Exercise per Session: 0 min  Stress: Stress Concern Present   Feeling of Stress : To some extent  Social Connections: Socially Isolated   Frequency of Communication with Friends and Family: More than three times a week   Frequency of Social Gatherings with Friends and Family: More than three times a week   Attends Religious Services: Never   Marine scientist or Organizations: No   Attends Music therapist: Never   Marital Status: Never married  Human resources officer Violence: Not At Risk   Fear of Current or Ex-Partner: No   Emotionally Abused: No   Physically Abused: No   Sexually Abused: No    Outpatient Medications Prior to Visit   Medication Sig Dispense Refill   acetaminophen (TYLENOL) 325 MG tablet Take 650 mg by mouth every 6 (six) hours as needed.     anastrozole (ARIMIDEX) 1 MG tablet Take 1 tablet by mouth once daily 30 tablet 6   Ascorbic Acid (VITAMIN C) 1000 MG tablet Take 2,000 mg by mouth daily.     calcium carbonate (OS-CAL) 1250 (500 Ca) MG chewable tablet Chew 1 tablet by mouth 2 (two) times daily.      celecoxib (CELEBREX) 200 MG capsule Take 200 mg by mouth daily.     cholecalciferol (VITAMIN D3) 25 MCG (1000 UT) tablet Take 2,000 Units  by mouth daily.     diclofenac Sodium (VOLTAREN) 1 % GEL Apply 2 g topically 4 (four) times daily.      Docusate Calcium (STOOL SOFTENER PO) Take by mouth.     losartan (COZAAR) 50 MG tablet Take 1 tablet (50 mg total) by mouth daily. (Needs to be seen before next refill) 30 tablet 0   Multiple Vitamin (MULTIVITAMIN) tablet Take 1 tablet by mouth daily.     escitalopram (LEXAPRO) 10 MG tablet Take 1 tablet (10 mg total) by mouth daily. 60 tablet 1   No facility-administered medications prior to visit.    Allergies  Allergen Reactions   Sulfa Antibiotics Nausea Only    ROS Review of Systems  Constitutional: Negative.   HENT: Negative.    Respiratory: Negative.    Gastrointestinal: Negative.   Musculoskeletal: Negative.   Skin:  Negative for rash.  Psychiatric/Behavioral:  The patient is nervous/anxious.   All other systems reviewed and are negative.    Objective:    Physical Exam Vitals and nursing note reviewed.  Constitutional:      Appearance: Normal appearance.  HENT:     Head: Normocephalic.     Nose: Nose normal.  Eyes:     Conjunctiva/sclera: Conjunctivae normal.  Cardiovascular:     Rate and Rhythm: Normal rate and regular rhythm.     Pulses: Normal pulses.     Heart sounds: Normal heart sounds.  Pulmonary:     Effort: Pulmonary effort is normal.     Breath sounds: Normal breath sounds.  Abdominal:     General: Bowel sounds are  normal.  Skin:    General: Skin is dry.     Findings: No rash.  Neurological:     Mental Status: She is alert and oriented to person, place, and time.  Psychiatric:        Attention and Perception: Attention and perception normal.        Mood and Affect: Mood is anxious and depressed.        Behavior: Behavior normal. Behavior is cooperative.    BP (!) 150/75   Pulse 60   Temp (!) 97.4 F (36.3 C) (Temporal)   Ht 5' 4" (1.626 m)   Wt 224 lb (101.6 kg)   SpO2 97%   BMI 38.45 kg/m  Wt Readings from Last 3 Encounters:  03/05/21 224 lb (101.6 kg)  02/15/21 223 lb 12.8 oz (101.5 kg)  12/04/20 223 lb (101.2 kg)     Health Maintenance Due  Topic Date Due   PNA vac Low Risk Adult (1 of 2 - PCV13) Never done   COVID-19 Vaccine (3 - Pfizer risk series) 06/30/2020    There are no preventive care reminders to display for this patient.  Lab Results  Component Value Date   TSH 0.416 (L) 10/21/2018   Lab Results  Component Value Date   WBC 5.4 02/09/2021   HGB 13.1 02/09/2021   HCT 40.7 02/09/2021   MCV 97.6 02/09/2021   PLT 147 (L) 02/09/2021   Lab Results  Component Value Date   NA 138 02/09/2021   K 4.0 02/09/2021   CO2 28 02/09/2021   GLUCOSE 100 (H) 02/09/2021   BUN 19 02/09/2021   CREATININE 0.66 02/09/2021   BILITOT 0.7 02/09/2021   ALKPHOS 68 02/09/2021   AST 23 02/09/2021   ALT 17 02/09/2021   PROT 6.8 02/09/2021   ALBUMIN 3.9 02/09/2021   CALCIUM 8.9 02/09/2021   ANIONGAP 6 02/09/2021     EGFR 83 10/20/2020   Lab Results  Component Value Date   CHOL 146 10/20/2020   Lab Results  Component Value Date   HDL 65 10/20/2020   Lab Results  Component Value Date   LDLCALC 70 10/20/2020   Lab Results  Component Value Date   TRIG 52 10/20/2020   Lab Results  Component Value Date   CHOLHDL 2.2 10/20/2020   Lab Results  Component Value Date   HGBA1C 5.1 10/21/2018      Assessment & Plan:   Problem List Items Addressed This Visit        Cardiovascular and Mediastinum   Essential hypertension - Primary    Blood pressure well controlled on current medication. Continue current diet, follow up in 3-6 months, labs completed at the cancer center 02/09/2021, results with  Normal range.         Other   Anxiety    Anxiety well controlled, Lexapro 10 mg decreased to 5 mg tablet by mouth daily. Follow up with changes to symptoms.        Relevant Medications   escitalopram (LEXAPRO) 5 MG tablet   Depression, major, single episode, moderate (HCC)    No worsening syptoms, symptoms well controlled, lexapro decreased to 5 mg tablet daily.       Relevant Medications   escitalopram (LEXAPRO) 5 MG tablet    Meds ordered this encounter  Medications   escitalopram (LEXAPRO) 5 MG tablet    Sig: Take 1 tablet (5 mg total) by mouth daily.    Dispense:  60 tablet    Refill:  1    Order Specific Question:   Supervising Provider    Answer:   Janora Norlander [[id number]]     Follow-up: Return in about 6 months (around 09/05/2021).    Ivy Lynn, NP

## 2021-03-05 NOTE — Patient Instructions (Signed)

## 2021-03-05 NOTE — Assessment & Plan Note (Signed)
Anxiety well controlled, Lexapro 10 mg decreased to 5 mg tablet by mouth daily. Follow up with changes to symptoms.

## 2021-03-05 NOTE — Assessment & Plan Note (Signed)
No worsening syptoms, symptoms well controlled, lexapro decreased to 5 mg tablet daily.

## 2021-03-22 ENCOUNTER — Other Ambulatory Visit: Payer: Self-pay | Admitting: *Deleted

## 2021-03-22 DIAGNOSIS — I10 Essential (primary) hypertension: Secondary | ICD-10-CM

## 2021-03-22 MED ORDER — LOSARTAN POTASSIUM 50 MG PO TABS: 50.0000 mg | ORAL_TABLET | Freq: Every day | ORAL | 1 refills | Status: DC

## 2021-06-24 ENCOUNTER — Other Ambulatory Visit: Payer: Self-pay | Admitting: Nurse Practitioner

## 2021-06-24 DIAGNOSIS — F321 Major depressive disorder, single episode, moderate: Secondary | ICD-10-CM

## 2021-06-24 DIAGNOSIS — F419 Anxiety disorder, unspecified: Secondary | ICD-10-CM

## 2021-07-11 ENCOUNTER — Ambulatory Visit: Payer: Medicare Other

## 2021-08-15 ENCOUNTER — Other Ambulatory Visit (HOSPITAL_COMMUNITY): Payer: Self-pay | Admitting: Hematology

## 2021-08-15 DIAGNOSIS — C50512 Malignant neoplasm of lower-outer quadrant of left female breast: Secondary | ICD-10-CM

## 2021-08-28 ENCOUNTER — Other Ambulatory Visit: Payer: Self-pay

## 2021-08-28 ENCOUNTER — Inpatient Hospital Stay (HOSPITAL_COMMUNITY): Payer: BC Managed Care – PPO | Attending: Hematology

## 2021-08-28 DIAGNOSIS — C50512 Malignant neoplasm of lower-outer quadrant of left female breast: Secondary | ICD-10-CM | POA: Diagnosis not present

## 2021-08-28 DIAGNOSIS — M8580 Other specified disorders of bone density and structure, unspecified site: Secondary | ICD-10-CM | POA: Diagnosis not present

## 2021-08-28 DIAGNOSIS — Z170 Estrogen receptor positive status [ER+]: Secondary | ICD-10-CM | POA: Insufficient documentation

## 2021-08-28 DIAGNOSIS — Z79811 Long term (current) use of aromatase inhibitors: Secondary | ICD-10-CM | POA: Insufficient documentation

## 2021-08-28 LAB — COMPREHENSIVE METABOLIC PANEL
ALT: 20 U/L (ref 0–44)
AST: 28 U/L (ref 15–41)
Albumin: 4.1 g/dL (ref 3.5–5.0)
Alkaline Phosphatase: 95 U/L (ref 38–126)
Anion gap: 8 (ref 5–15)
BUN: 17 mg/dL (ref 8–23)
CO2: 27 mmol/L (ref 22–32)
Calcium: 9.5 mg/dL (ref 8.9–10.3)
Chloride: 106 mmol/L (ref 98–111)
Creatinine, Ser: 0.82 mg/dL (ref 0.44–1.00)
GFR, Estimated: >60 mL/min (ref >60)
Glucose, Bld: 117 mg/dL — ABNORMAL HIGH (ref 70–99)
Potassium: 4 mmol/L (ref 3.5–5.1)
Sodium: 141 mmol/L (ref 135–145)
Total Bilirubin: 0.5 mg/dL (ref 0.3–1.2)
Total Protein: 7.2 g/dL (ref 6.5–8.1)

## 2021-08-28 LAB — CBC WITH DIFFERENTIAL/PLATELET
Abs Immature Granulocytes: 0.01 10*3/uL (ref 0.00–0.07)
Basophils Absolute: 0.1 10*3/uL (ref 0.0–0.1)
Basophils Relative: 1 %
Eosinophils Absolute: 0.3 10*3/uL (ref 0.0–0.5)
Eosinophils Relative: 5 %
HCT: 39.4 % (ref 36.0–46.0)
Hemoglobin: 13.3 g/dL (ref 12.0–15.0)
Immature Granulocytes: 0 %
Lymphocytes Relative: 22 %
Lymphs Abs: 1.3 10*3/uL (ref 0.7–4.0)
MCH: 32.7 pg (ref 26.0–34.0)
MCHC: 33.8 g/dL (ref 30.0–36.0)
MCV: 96.8 fL (ref 80.0–100.0)
Monocytes Absolute: 0.5 10*3/uL (ref 0.1–1.0)
Monocytes Relative: 8 %
Neutro Abs: 3.9 10*3/uL (ref 1.7–7.7)
Neutrophils Relative %: 64 %
Platelets: 160 10*3/uL (ref 150–400)
RBC: 4.07 MIL/uL (ref 3.87–5.11)
RDW: 12.5 % (ref 11.5–15.5)
WBC: 6 10*3/uL (ref 4.0–10.5)
nRBC: 0 % (ref 0.0–0.2)

## 2021-08-28 LAB — VITAMIN D 25 HYDROXY (VIT D DEFICIENCY, FRACTURES): Vit D, 25-Hydroxy: 61.92 ng/mL (ref 30–100)

## 2021-08-29 LAB — CANCER ANTIGEN 15-3: CA 15-3: 22 U/mL (ref 0.0–25.0)

## 2021-09-04 ENCOUNTER — Other Ambulatory Visit: Payer: Self-pay

## 2021-09-04 ENCOUNTER — Inpatient Hospital Stay (HOSPITAL_COMMUNITY): Payer: BC Managed Care – PPO | Admitting: Hematology

## 2021-09-04 VITALS — BP 141/71 | HR 93 | Temp 97.8°F | Resp 18 | Wt 222.4 lb

## 2021-09-04 DIAGNOSIS — C50512 Malignant neoplasm of lower-outer quadrant of left female breast: Secondary | ICD-10-CM | POA: Diagnosis not present

## 2021-09-04 DIAGNOSIS — Z170 Estrogen receptor positive status [ER+]: Secondary | ICD-10-CM

## 2021-09-04 NOTE — Patient Instructions (Addendum)
[place] at [facility] Discharge Instructions  You were seen and examined today by Dr. Delton Coombes. He reviewed your most recent labs and everything looks good. Continue taking Anastrozole, over the counter Vitamin D and Calcium. We will get you set up for Mammogram. Please keep follow up appointment as scheduled in 6 months.   Thank you for choosing [facility] at [facility] to provide your oncology and hematology care.  To afford each patient quality time with our provider, please arrive at least 15 minutes before your scheduled appointment time.   If you have a lab appointment with the [facility] please come in thru the Main Entrance and check in at the main information desk.  You need to re-schedule your appointment should you arrive 10 or more minutes late.  We strive to give you quality time with our providers, and arriving late affects you and other patients whose appointments are after yours.  Also, if you no show three or more times for appointments you may be dismissed from the clinic at the providers discretion.     Again, thank you for choosing [facility].  Our hope is that these requests will decrease the amount of time that you wait before being seen by our physicians.       _____________________________________________________________  Should you have questions after your visit to [facility], please contact our office at [phone] and follow the prompts.  Our office hours are 8:00 a.m. and 4:30 p.m. Monday - Friday.  Please note that voicemails left after 4:00 p.m. may not be returned until the following business day.  We are closed weekends and major holidays.  You do have access to a nurse 24-7, just call the main number to the clinic [phone] and do not press any options, hold on the line and a nurse will answer the phone.    For prescription refill requests, have your pharmacy contact our office  and allow 72 hours.    Due to Covid, you will need to wear a mask upon entering the hospital. If you do not have a mask, a mask will be given to you at the Main Entrance upon arrival. For doctor visits, patients may have 1 support person age 81 or older with them. For treatment visits, patients can not have anyone with them due to social distancing guidelines and our immunocompromised population.

## 2021-09-04 NOTE — Progress Notes (Signed)
Patient is taking Anastrozole as prescribed.  She has not missed any doses and reports no side effects at this time.   

## 2021-09-04 NOTE — Progress Notes (Signed)
[place],  [id number]   Patient Care Team: Ivy Lynn, NP as PCP - General (Nurse Practitioner)  SUMMARY OF ONCOLOGIC HISTORY: Oncology History  Malignant neoplasm of lower-outer quadrant of left breast of female, estrogen receptor positive ([place])  10/27/2018 Initial Diagnosis   Malignant neoplasm of lower-outer quadrant of left breast of female, estrogen receptor positive ([place])   12/10/2018 Cancer Staging   Staging form: Breast, AJCC 8th Edition - Clinical stage from 12/10/2018: Stage IIIB (cT4b, cN0, cM0, G2, ER+, PR+, HER2-, Oncotype DX score: 2) - Signed by Derek Jack, MD on 12/10/2018      CHIEF COMPLIANT: Follow up for left breast cancer   INTERVAL HISTORY: [name] is a 71 y.o. female here today for follow up of her left breast cancer. Her last visit was on 06/20/2020.   Today she reports feeling well. She is taking anastrozole and tolerating it well. She reports occasional hot flashes at night which are tolerable. She takes 1000 units of vitamin D3 once daily, and she takes calcium + vitamin D BID.   REVIEW OF SYSTEMS:   Review of Systems  Constitutional:  Positive for fatigue. Negative for appetite change.  Gastrointestinal:  Positive for constipation and diarrhea.  Endocrine: Positive for hot flashes (occasional).  Psychiatric/Behavioral:  Positive for sleep disturbance.   All other systems reviewed and are negative.  I have reviewed the past medical history, past surgical history, social history and family history with the patient and they are unchanged from previous note.   ALLERGIES:   is allergic to sulfa antibiotics.   MEDICATIONS:  Current Outpatient Medications  Medication Sig Dispense Refill   acetaminophen (TYLENOL) 325 MG tablet Take 650 mg by mouth every 6 (six) hours as needed.     anastrozole (ARIMIDEX) 1 MG tablet Take 1 tablet by mouth once daily 30 tablet 6   Ascorbic Acid  (VITAMIN C) 1000 MG tablet Take 2,000 mg by mouth daily.     calcium carbonate (OS-CAL) 1250 (500 Ca) MG chewable tablet Chew 1 tablet by mouth 2 (two) times daily.      celecoxib (CELEBREX) 200 MG capsule Take 200 mg by mouth daily.     cholecalciferol (VITAMIN D3) 25 MCG (1000 UT) tablet Take 2,000 Units by mouth daily.     diclofenac Sodium (VOLTAREN) 1 % GEL Apply 2 g topically 4 (four) times daily.      Docusate Calcium (STOOL SOFTENER PO) Take by mouth.     escitalopram (LEXAPRO) 5 MG tablet Take 1 tablet by mouth once daily 90 tablet 0   losartan (COZAAR) 50 MG tablet Take 1 tablet (50 mg total) by mouth daily. 90 tablet 1   Multiple Vitamin (MULTIVITAMIN) tablet Take 1 tablet by mouth daily.     No current facility-administered medications for this visit.     PHYSICAL EXAMINATION: Performance status (ECOG): 1 - Symptomatic but completely ambulatory  Vitals:   09/04/21 1456  BP: (!) 141/71  Pulse: 93  Resp: 18  Temp: 97.8 F (36.6 C)  SpO2: 94%   Wt Readings from Last 3 Encounters:  09/04/21 222 lb 6.4 oz (100.9 kg)  03/05/21 224 lb (101.6 kg)  02/15/21 223 lb 12.8 oz (101.5 kg)   Physical Exam Vitals reviewed.  Constitutional:      Appearance: Normal appearance. She is obese.  Cardiovascular:     Rate and Rhythm: Normal rate and regular rhythm.     Pulses: Normal  pulses.     Heart sounds: Normal heart sounds.  Pulmonary:     Effort: Pulmonary effort is normal.     Breath sounds: Normal breath sounds.  Chest:  Breasts:    Right: Normal. No swelling, bleeding, inverted nipple, mass, nipple discharge, skin change or tenderness.     Left: Absent. No swelling, bleeding, mass, skin change (mastecomy wihtin normal limits) or tenderness.  Abdominal:     Palpations: Abdomen is soft. There is no hepatomegaly, splenomegaly or mass.     Tenderness: There is no abdominal tenderness.  Musculoskeletal:     Right lower leg: No edema.     Left lower leg: No edema.   Lymphadenopathy:     Upper Body:     Right upper body: No supraclavicular, axillary or pectoral adenopathy.     Left upper body: No supraclavicular, axillary or pectoral adenopathy.  Neurological:     General: No focal deficit present.     Mental Status: She is alert and oriented to person, place, and time.  Psychiatric:        Mood and Affect: Mood normal.        Behavior: Behavior normal.    Breast Exam Chaperone: Thana Ates     LABORATORY DATA:  I have reviewed the data as listed CMP Latest Ref Rng & Units 08/28/2021 02/09/2021 10/20/2020  Glucose 70 - 99 mg/dL 117(H) 100(H) 99  BUN 8 - 23 mg/dL '17 19 17  ' Creatinine 0.44 - 1.00 mg/dL 0.82 0.66 0.78  Sodium 135 - 145 mmol/L 141 138 142  Potassium 3.5 - 5.1 mmol/L 4.0 4.0 3.8  Chloride 98 - 111 mmol/L 106 104 103  CO2 22 - 32 mmol/L '27 28 26  ' Calcium 8.9 - 10.3 mg/dL 9.5 8.9 9.3  Total Protein 6.5 - 8.1 g/dL 7.2 6.8 6.6  Total Bilirubin 0.3 - 1.2 mg/dL 0.5 0.7 0.4  Alkaline Phos 38 - 126 U/L 95 68 91  AST 15 - 41 U/L '28 23 20  ' ALT 0 - 44 U/L '20 17 13   ' Lab Results  Component Value Date   CAN153 22.0 08/28/2021   CAN153 21.2 02/09/2021   CAN153 21.9 06/13/2020   Lab Results  Component Value Date   WBC 6.0 08/28/2021   HGB 13.3 08/28/2021   HCT 39.4 08/28/2021   MCV 96.8 08/28/2021   PLT 160 08/28/2021   NEUTROABS 3.9 08/28/2021    ASSESSMENT:  1.  Stage IIIb (T4N0) left breast IDC, ER/PR positive, HER-2 negative: -Biopsy of the left breast on 10/20/2018 showed IDC.  Left axillary core biopsy was negative. -Left mastectomy on 11/12/2018, 11 cm IDC, grade 2, margins negative, 0/14 lymph node positive, ER/PR positive, HER-2 2+ by IHC and negative by FISH, Ki-67 10%. -Oncotype DX recurrence score of 2. -Anastrozole started on 12/10/2018. -XRT from 02/01/2019-03/18/2019. -Right breast mammogram on 12/16/2019 shows BI-RADS 1.   2.  Osteopenia: -DEXA scan on 02/09/2019 shows T score of -2. 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   PLAN:  1.  Stage IIIb (T4N0) left breast IDC, ER/PR positive, HER-2 negative: - Left mastectomy site is within normal limits.  Right breast has no palpable masses or adenopathy. - She is tolerating anastrozole very well.  She has occasional hot flashes which are well tolerable. - Reviewed labs which showed normal LFTs and CBC.  CA 15-3 was normal. - RTC 6 months for follow-up.  We will plan to arrange for right breast mammogram on 02/09/2022.  2.  Osteopenia: -DEXA scan on 02/09/2021 with T score -2.1. - Vitamin D 61.9. - Continue calcium and vitamin D twice daily.  She also takes vitamin D 1000 units daily.   Breast Cancer therapy associated bone loss: I have recommended calcium, Vitamin D and weight bearing exercises.  Orders placed this encounter:  No orders of the defined types were placed in this encounter.   The patient has a good understanding of the overall plan. She agrees with it. She will call with any problems that may develop before the next visit here.  Derek Jack, MD [place] [phone]   I, Thana Ates, am acting as a scribe for Dr. Derek Jack.  I, Derek Jack MD, have reviewed the above documentation for accuracy and completeness, and I agree with the above.

## 2021-09-05 ENCOUNTER — Encounter: Payer: Self-pay | Admitting: Nurse Practitioner

## 2021-09-05 ENCOUNTER — Ambulatory Visit: Payer: BC Managed Care – PPO | Admitting: Nurse Practitioner

## 2021-09-05 VITALS — BP 120/67 | HR 74 | Temp 97.5°F | Ht 64.0 in | Wt 223.4 lb

## 2021-09-05 DIAGNOSIS — F320 Major depressive disorder, single episode, mild: Secondary | ICD-10-CM | POA: Diagnosis not present

## 2021-09-05 DIAGNOSIS — F321 Major depressive disorder, single episode, moderate: Secondary | ICD-10-CM | POA: Diagnosis not present

## 2021-09-05 DIAGNOSIS — Z0000 Encounter for general adult medical examination without abnormal findings: Secondary | ICD-10-CM

## 2021-09-05 DIAGNOSIS — I10 Essential (primary) hypertension: Secondary | ICD-10-CM

## 2021-09-05 DIAGNOSIS — Z23 Encounter for immunization: Secondary | ICD-10-CM

## 2021-09-05 DIAGNOSIS — F419 Anxiety disorder, unspecified: Secondary | ICD-10-CM | POA: Diagnosis not present

## 2021-09-05 MED ORDER — ESCITALOPRAM OXALATE 5 MG PO TABS: 5.0000 mg | ORAL_TABLET | Freq: Every day | ORAL | 3 refills | Status: DC

## 2021-09-05 MED ORDER — LOSARTAN POTASSIUM 50 MG PO TABS: 50.0000 mg | ORAL_TABLET | Freq: Every day | ORAL | 3 refills | Status: DC

## 2021-09-05 NOTE — Assessment & Plan Note (Signed)
Anxiety continues to be well controlled on Lexapro 5 mg tablet by mouth daily.  Completed GAD-7.

## 2021-09-05 NOTE — Assessment & Plan Note (Signed)
Depression continues to be well controlled on Lexapro 5 mg tablet by mouth daily.  Completed PHQ-9.

## 2021-09-05 NOTE — Assessment & Plan Note (Addendum)
Hypertension well-controlled on Cozaar 50 mg tablet by mouth daily.  No changes necessary.  Follow-up in 6 months.  All lab work completed at the cancer center.

## 2021-09-05 NOTE — Addendum Note (Signed)
Addended by: Milas Hock on: 09/05/2021 09:42 AM   Modules accepted: Orders

## 2021-09-05 NOTE — Progress Notes (Signed)
Established Patient Office Visit  Subjective:  Patient ID: Anna Carney, female    DOB: June 15, 1951  Age: 71 y.o. MRN: [id number]  CC:  Chief Complaint  Patient presents with   Medical Management of Chronic Issues   Hypertension    HPI Anna Carney  presents for follow up of hypertension. Patient was diagnosed 12/26/2020,. The patient is tolerating the medication well without side effects. Compliance with treatment has been good; including taking medication as directed , maintains a healthy diet and regular exercise regimen , and following up as directed.  Current medication Cozaar 50 mg tablet by mouth daily.  Anxiety, Follow-up  She was last seen for anxiety 6 months ago. Changes made at last visit include Lexapro changed from 10 mg tablet to 5 mg tablet by mouth daily..   She reports excellent compliance with treatment. She reports good tolerance of treatment. She is not having side effects.  She feels her anxiety is mild and Improved since last visit  Symptoms: No chest pain No difficulty concentrating  No dizziness No fatigue  No feelings of losing control No insomnia  No irritable No palpitations  No panic attacks No racing thoughts  No shortness of breath No sweating  No tremors/shakes    GAD-7 Results GAD-7 Generalized Anxiety Disorder Screening Tool 09/05/2021 03/05/2021 12/04/2020  1. Feeling Nervous, Anxious, or on Edge 0 0 0  2. Not Being Able to Stop or Control Worrying 0 0 0  3. Worrying Too Much About Different Things 0 0 0  4. Trouble Relaxing 0 0 0  5. Being So Restless it's Hard To Sit Still 0 0 0  6. Becoming Easily Annoyed or Irritable 0 0 0  7. Feeling Afraid As If Something Awful Might Happen 0 0 0  Total GAD-7 Score 0 0 0  Difficulty At Work, Home, or Getting  Along With Others? 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   Hypertension    Shingles     Past Surgical History:  Procedure Laterality Date   CHOLECYSTECTOMY     MASTECTOMY W/ SENTINEL NODE BIOPSY Left 11/12/2018   Procedure: LEFT MASTECTOMY WITH LEFT AXILLARY SENTINEL LYMPH NODE BIOPSY;  Surgeon: Rolm Bookbinder, MD;  Location: [facility];  Service: General;  Laterality: Left;    Family History  Problem Relation Age of Onset   Arthritis Mother    Diabetes Mother    Stroke Mother    Asthma Father    Colon cancer Brother    Arthritis Daughter    Thyroid disease Daughter    Breast cancer Maternal Aunt     Social History   Socioeconomic History   Marital status: Single    Spouse name: Not on file   Number of children: Not on file   Years of education: Not on file   Highest education level: Not on file  Occupational History   Occupation: [employer]    Comment: in garden center   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  Tobacco Use   Smoking status: Former   Smokeless tobacco: Never  Scientific laboratory technician Use: Never used  Substance and Sexual Activity  Alcohol use: Yes    Comment: rarely   Drug use: No   Sexual activity: Not Currently    Birth control/protection: Post-menopausal  Other Topics Concern   Not on file  Social History Narrative   Not on file   Social Determinants of Health   Financial Resource Strain: Not on file  Food Insecurity: Not on file  Transportation Needs: Not on file  Physical Activity: Not on file  Stress: Not on file  Social Connections: Not on file  Intimate Partner Violence: Not on file    Outpatient Medications Prior to Visit  Medication Sig Dispense Refill   acetaminophen (TYLENOL) 325 MG tablet Take 650 mg by mouth every 6 (six) hours as needed.     anastrozole (ARIMIDEX) 1 MG tablet Take 1 tablet by mouth once daily 30 tablet 6    Ascorbic Acid (VITAMIN C) 1000 MG tablet Take 2,000 mg by mouth daily.     calcium carbonate (OS-CAL) 1250 (500 Ca) MG chewable tablet Chew 1 tablet by mouth 2 (two) times daily.      celecoxib (CELEBREX) 200 MG capsule Take 200 mg by mouth daily.     cholecalciferol (VITAMIN D3) 25 MCG (1000 UT) tablet Take 2,000 Units by mouth daily.     diclofenac Sodium (VOLTAREN) 1 % GEL Apply 2 g topically 4 (four) times daily.      Docusate Calcium (STOOL SOFTENER PO) Take by mouth.     Multiple Vitamin (MULTIVITAMIN) tablet Take 1 tablet by mouth daily.     escitalopram (LEXAPRO) 5 MG tablet Take 1 tablet by mouth once daily 90 tablet 0   losartan (COZAAR) 50 MG tablet Take 1 tablet (50 mg total) by mouth daily. 90 tablet 1   No facility-administered medications prior to visit.    Allergies  Allergen Reactions   Sulfa Antibiotics Nausea Only    ROS Review of Systems  Constitutional: Negative.   HENT: Negative.    Eyes: Negative.   Respiratory: Negative.    Cardiovascular: Negative.   Gastrointestinal: Negative.   Genitourinary: Negative.   Musculoskeletal: Negative.   All other systems reviewed and are negative.    Objective:    Physical Exam Constitutional:      Appearance: Normal appearance. She is well-groomed. She is obese.  HENT:     Right Ear: Ear canal and external ear normal.     Left Ear: Ear canal and external ear normal.     Nose: Nose normal.     Mouth/Throat:     Pharynx: Oropharynx is clear.  Eyes:     Conjunctiva/sclera: Conjunctivae normal.  Cardiovascular:     Rate and Rhythm: Normal rate and regular rhythm.  Abdominal:     General: Bowel sounds are normal.  Neurological:     Mental Status: She is alert and oriented to person, place, and time.  Psychiatric:        Behavior: Behavior normal. Behavior is cooperative.    BP 120/67    Pulse 74    Temp (!) 97.5 F (36.4 C) (Temporal)    Ht $R'5\' 4"'Qc$  (1.626 m)    Wt 223 lb 6 oz (101.3 kg)    BMI 38.34 kg/m  Wt  Readings from Last 3 Encounters:  09/05/21 223 lb 6 oz (101.3 kg)  09/04/21 222 lb 6.4 oz (100.9 kg)  03/05/21 224 lb (101.6 kg)     Health Maintenance Due  Topic Date Due   Pneumonia Vaccine 96+ Years old (1 - PCV)  Never done   COLONOSCOPY (Pts 45-21yr Insurance coverage will need to be confirmed)  Never done   INFLUENZA VACCINE  03/19/2021    There are no preventive care reminders to display for this patient.  Lab Results  Component Value Date   TSH 0.416 (L) 10/21/2018   Lab Results  Component Value Date   WBC 6.0 08/28/2021   HGB 13.3 08/28/2021   HCT 39.4 08/28/2021   MCV 96.8 08/28/2021   PLT 160 08/28/2021   Lab Results  Component Value Date   NA 141 08/28/2021   K 4.0 08/28/2021   CO2 27 08/28/2021   GLUCOSE 117 (H) 08/28/2021   BUN 17 08/28/2021   CREATININE 0.82 08/28/2021   BILITOT 0.5 08/28/2021   ALKPHOS 95 08/28/2021   AST 28 08/28/2021   ALT 20 08/28/2021   PROT 7.2 08/28/2021   ALBUMIN 4.1 08/28/2021   CALCIUM 9.5 08/28/2021   ANIONGAP 8 08/28/2021   EGFR 83 10/20/2020   Lab Results  Component Value Date   CHOL 146 10/20/2020   Lab Results  Component Value Date   HDL 65 10/20/2020   Lab Results  Component Value Date   LDLCALC 70 10/20/2020   Lab Results  Component Value Date   TRIG 52 10/20/2020   Lab Results  Component Value Date   CHOLHDL 2.2 10/20/2020   Lab Results  Component Value Date   HGBA1C 5.1 10/21/2018      Assessment & Plan:   Problem List Items Addressed This Visit       Cardiovascular and Mediastinum   Essential hypertension - Primary    Hypertension well-controlled on Cozaar 50 mg tablet by mouth daily.  No changes necessary.  Follow-up in 6 months.  All lab work completed at the cancer center.      Relevant Medications   losartan (COZAAR) 50 MG tablet     Other   Depression, major, single episode, mild (HCC)    Depression continues to be well controlled on Lexapro 5 mg tablet by mouth daily.   Completed PHQ-9.      Relevant Medications   escitalopram (LEXAPRO) 5 MG tablet   Anxiety    Anxiety continues to be well controlled on Lexapro 5 mg tablet by mouth daily.  Completed GAD-7.      Relevant Medications   escitalopram (LEXAPRO) 5 MG tablet   Depression, major, single episode, moderate (HCC)   Relevant Medications   escitalopram (LEXAPRO) 5 MG tablet   Other Visit Diagnoses     Health care maintenance       Relevant Orders   Cologuard       Meds ordered this encounter  Medications   escitalopram (LEXAPRO) 5 MG tablet    Sig: Take 1 tablet (5 mg total) by mouth daily.    Dispense:  90 tablet    Refill:  3   losartan (COZAAR) 50 MG tablet    Sig: Take 1 tablet (50 mg total) by mouth daily.    Dispense:  90 tablet    Refill:  3    Follow-up: Return in about 6 months (around 03/05/2022).    OIvy Lynn NP

## 2021-09-05 NOTE — Patient Instructions (Signed)
Managing Depression, Adult Depression is a mental health condition that affects your thoughts, feelings, and actions. Being diagnosed with depression can bring you relief if you did not know why you have felt or behaved a certain way. It could also leave you feeling overwhelmed with uncertainty about your future. Preparing yourself to manage your symptoms can help you feel more positive about your future. How to manage lifestyle changes Managing stress Stress is your body's reaction to life changes and events, both good and bad. Stress can add to your feelings of depression. Learning to manage your stress can help lessen your feelings of depression. Try some of the following approaches to reducing your stress (stress reduction techniques): Listen to music that you enjoy and that inspires you. Try using a meditation app or take a meditation class. Develop a practice that helps you connect with your spiritual self. Walk in nature, pray, or go to a place of worship. Do some deep breathing. To do this, inhale slowly through your nose. Pause at the top of your inhale for a few seconds and then exhale slowly, letting your muscles relax. Practice yoga to help relax and work your muscles. Choose a stress reduction technique that suits your lifestyle and personality. These techniques take time and practice to develop. Set aside 5-15 minutes a day to do them. Therapists can offer training in these techniques. Other things you can do to manage stress include: Keeping a stress diary. Knowing your limits and saying no when you think something is too much. Paying attention to how you react to certain situations. You may not be able to control everything, but you can change your reaction. Adding humor to your life by watching funny films or TV shows. Making time for activities that you enjoy and that relax you.  Medicines Medicines, such as antidepressants, are often a part of treatment for depression. Talk  with your pharmacist or health care provider about all the medicines, supplements, and herbal products that you take, their possible side effects, and what medicines and other products are safe to take together. Make sure to report any side effects you may have to your health care provider. Relationships Your health care provider may suggest family therapy, couples therapy, or individual therapy as part of your treatment. How to recognize changes Everyone responds differently to treatment for depression. As you recover from depression, you may start to: Have more interest in doing activities. Feel less hopeless. Have more energy. Overeat less often, or have a better appetite. Have better mental focus. It is important to recognize if your depression is not getting better or is getting worse. The symptoms you had in the beginning may return, such as: Tiredness (fatigue) or low energy. Eating too much or too little. Sleeping too much or too little. Feeling restless, agitated, or hopeless. Trouble focusing or making decisions. Unexplained physical complaints. Feeling irritable, angry, or aggressive. If you or your family members notice these symptoms coming back, let your health care provider know right away. Follow these instructions at home: Activity  Try to get some form of exercise each day, such as walking, biking, swimming, or lifting weights. Practice stress reduction techniques. Engage your mind by taking a class or doing some volunteer work. Lifestyle Get the right amount and quality of sleep. Cut down on using caffeine, tobacco, alcohol, and other potentially harmful substances. Eat a healthy diet that includes plenty of vegetables, fruits, whole grains, low-fat dairy products, and lean protein. Do not eat a lot  of foods that are high in solid fats, added sugars, or salt (sodium). General instructions Take over-the-counter and prescription medicines only as told by your health  care provider. Keep all follow-up visits as told by your health care provider. This is important. Where to find support Talking to others Friends and family members can be sources of support and guidance. Talk to trusted friends or family members about your condition. Explain your symptoms to them, and let them know that you are working with a health care provider to treat your depression. Tell friends and family members how they also can be helpful. Finances Find appropriate mental health providers that fit with your financial situation. Talk with your health care provider about options to get reduced prices on your medicines. Where to find more information You can find support in your area from: Anxiety and Depression Association of America (ADAA): www.adaa.org Mental Health America: www.mentalhealthamerica.net [employer] on Mental Illness: www.nami.org Contact a health care provider if: You stop taking your antidepressant medicines, and you have any of these symptoms: Nausea. Headache. Light-headedness. Chills and body aches. Not being able to sleep (insomnia). You or your friends and family think your depression is getting worse. Get help right away if: You have thoughts of hurting yourself or others. If you ever feel like you may hurt yourself or others, or have thoughts about taking your own life, get help right away. Go to your nearest emergency department or: Call your local emergency services (911 in the [place] Call a suicide crisis helpline, such as the [place] at 551-617-9600 or 988 in the [place]. This is open 24 hours a day in the [place] Text the Crisis Text Line at 623-261-2322 (in the [place].). Summary If you are diagnosed with depression, preparing yourself to manage your symptoms is a good way to feel positive about your future. Work with your health care provider on a management plan that includes stress reduction techniques, medicines (if  applicable), therapy, and healthy lifestyle habits. Keep talking with your health care provider about how your treatment is working. If you have thoughts about taking your own life, call a suicide crisis helpline or text a crisis text line. This information is not intended to replace advice given to you by your health care provider. Make sure you discuss any questions you have with your health care provider. Document Revised: 02/28/2021 Document Reviewed: 06/16/2019 Elsevier Patient Education  2022 [place]. Hypertension, Adult High blood pressure (hypertension) is when the force of blood pumping through the arteries is too strong. The arteries are the blood vessels that carry blood from the heart throughout the body. Hypertension forces the heart to work harder to pump blood and may cause arteries to become narrow or stiff. Untreated or uncontrolled hypertension can cause a heart attack, heart failure, a stroke, kidney disease, and other problems. A blood pressure reading consists of a higher number over a lower number. Ideally, your blood pressure should be below 120/80. The first ("top") number is called the systolic pressure. It is a measure of the pressure in your arteries as your heart beats. The second ("bottom") number is called the diastolic pressure. It is a measure of the pressure in your arteries as the heart relaxes. What are the causes? The exact cause of this condition is not known. There are some conditions that result in or are related to high blood pressure. What increases the risk? Some risk factors for high blood pressure are under your control. The  following factors may make you more likely to develop this condition: Smoking. Having type 2 diabetes mellitus, high cholesterol, or both. Not getting enough exercise or physical activity. Being overweight. Having too much fat, sugar, calories, or salt (sodium) in your diet. Drinking too much alcohol. Some risk factors for high  blood pressure may be difficult or impossible to change. Some of these factors include: Having chronic kidney disease. Having a family history of high blood pressure. Age. Risk increases with age. Race. You may be at higher risk if you are African American. Gender. Men are at higher risk than women before age 72. After age 44, women are at higher risk than men. Having obstructive sleep apnea. Stress. What are the signs or symptoms? High blood pressure may not cause symptoms. Very high blood pressure (hypertensive crisis) may cause: Headache. Anxiety. Shortness of breath. Nosebleed. Nausea and vomiting. Vision changes. Severe chest pain. Seizures. How is this diagnosed? This condition is diagnosed by measuring your blood pressure while you are seated, with your arm resting on a flat surface, your legs uncrossed, and your feet flat on the floor. The cuff of the blood pressure monitor will be placed directly against the skin of your upper arm at the level of your heart. It should be measured at least twice using the same arm. Certain conditions can cause a difference in blood pressure between your right and left arms. Certain factors can cause blood pressure readings to be lower or higher than normal for a short period of time: When your blood pressure is higher when you are in a health care provider's office than when you are at home, this is called white coat hypertension. Most people with this condition do not need medicines. When your blood pressure is higher at home than when you are in a health care provider's office, this is called masked hypertension. Most people with this condition may need medicines to control blood pressure. If you have a high blood pressure reading during one visit or you have normal blood pressure with other risk factors, you may be asked to: Return on a different day to have your blood pressure checked again. Monitor your blood pressure at home for 1 week or  longer. If you are diagnosed with hypertension, you may have other blood or imaging tests to help your health care provider understand your overall risk for other conditions. How is this treated? This condition is treated by making healthy lifestyle changes, such as eating healthy foods, exercising more, and reducing your alcohol intake. Your health care provider may prescribe medicine if lifestyle changes are not enough to get your blood pressure under control, and if: Your systolic blood pressure is above 130. Your diastolic blood pressure is above 80. Your personal target blood pressure may vary depending on your medical conditions, your age, and other factors. Follow these instructions at home: Eating and drinking  Eat a diet that is high in fiber and potassium, and low in sodium, added sugar, and fat. An example eating plan is called the DASH (Dietary Approaches to Stop Hypertension) diet. To eat this way: Eat plenty of fresh fruits and vegetables. Try to fill one half of your plate at each meal with fruits and vegetables. Eat whole grains, such as whole-wheat pasta, brown rice, or whole-grain bread. Fill about one fourth of your plate with whole grains. Eat or drink low-fat dairy products, such as skim milk or low-fat yogurt. Avoid fatty cuts of meat, processed or cured meats, and  poultry with skin. Fill about one fourth of your plate with lean proteins, such as fish, chicken without skin, beans, eggs, or tofu. Avoid pre-made and processed foods. These tend to be higher in sodium, added sugar, and fat. Reduce your daily sodium intake. Most people with hypertension should eat less than 1,500 mg of sodium a day. Do not drink alcohol if: Your health care provider tells you not to drink. You are pregnant, may be pregnant, or are planning to become pregnant. If you drink alcohol: Limit how much you use to: 0-1 drink a day for women. 0-2 drinks a day for men. Be aware of how much alcohol is  in your drink. In the [place] one drink equals one 12 oz bottle of beer (355 mL), one 5 oz glass of wine (148 mL), or one 1 oz glass of hard liquor (44 mL). Lifestyle  Work with your health care provider to maintain a healthy body weight or to lose weight. Ask what an ideal weight is for you. Get at least 30 minutes of exercise most days of the week. Activities may include walking, swimming, or biking. Include exercise to strengthen your muscles (resistance exercise), such as Pilates or lifting weights, as part of your weekly exercise routine. Try to do these types of exercises for 30 minutes at least 3 days a week. Do not use any products that contain nicotine or tobacco, such as cigarettes, e-cigarettes, and chewing tobacco. If you need help quitting, ask your health care provider. Monitor your blood pressure at home as told by your health care provider. Keep all follow-up visits as told by your health care provider. This is important. Medicines Take over-the-counter and prescription medicines only as told by your health care provider. Follow directions carefully. Blood pressure medicines must be taken as prescribed. Do not skip doses of blood pressure medicine. Doing this puts you at risk for problems and can make the medicine less effective. Ask your health care provider about side effects or reactions to medicines that you should watch for. Contact a health care provider if you: Think you are having a reaction to a medicine you are taking. Have headaches that keep coming back (recurring). Feel dizzy. Have swelling in your ankles. Have trouble with your vision. Get help right away if you: Develop a severe headache or confusion. Have unusual weakness or numbness. Feel faint. Have severe pain in your chest or abdomen. Vomit repeatedly. Have trouble breathing. Summary Hypertension is when the force of blood pumping through your arteries is too strong. If this condition is not controlled,  it may put you at risk for serious complications. Your personal target blood pressure may vary depending on your medical conditions, your age, and other factors. For most people, a normal blood pressure is less than 120/80. Hypertension is treated with lifestyle changes, medicines, or a combination of both. Lifestyle changes include losing weight, eating a healthy, low-sodium diet, exercising more, and limiting alcohol. This information is not intended to replace advice given to you by your health care provider. Make sure you discuss any questions you have with your health care provider. Document Revised: 04/15/2018 Document Reviewed: 04/15/2018 Elsevier Patient Education  [place].

## 2022-03-04 ENCOUNTER — Inpatient Hospital Stay (HOSPITAL_COMMUNITY): Payer: BC Managed Care – PPO | Attending: Hematology

## 2022-03-04 ENCOUNTER — Ambulatory Visit (HOSPITAL_COMMUNITY)
Admission: RE | Admit: 2022-03-04 | Discharge: 2022-03-04 | Disposition: A | Discharge status: Home / Self Care | Payer: BC Managed Care – PPO | Source: Ambulatory Visit | Attending: Hematology | Admitting: Hematology

## 2022-03-04 DIAGNOSIS — Z170 Estrogen receptor positive status [ER+]: Secondary | ICD-10-CM | POA: Insufficient documentation

## 2022-03-04 DIAGNOSIS — C50512 Malignant neoplasm of lower-outer quadrant of left female breast: Secondary | ICD-10-CM | POA: Insufficient documentation

## 2022-03-04 DIAGNOSIS — Z79811 Long term (current) use of aromatase inhibitors: Secondary | ICD-10-CM | POA: Insufficient documentation

## 2022-03-04 DIAGNOSIS — M8580 Other specified disorders of bone density and structure, unspecified site: Secondary | ICD-10-CM | POA: Insufficient documentation

## 2022-03-04 DIAGNOSIS — Z1231 Encounter for screening mammogram for malignant neoplasm of breast: Secondary | ICD-10-CM | POA: Diagnosis not present

## 2022-03-04 LAB — CBC WITH DIFFERENTIAL/PLATELET
Abs Immature Granulocytes: 0.01 10*3/uL (ref 0.00–0.07)
Basophils Absolute: 0 10*3/uL (ref 0.0–0.1)
Basophils Relative: 1 %
Eosinophils Absolute: 0.3 10*3/uL (ref 0.0–0.5)
Eosinophils Relative: 6 %
HCT: 38.9 % (ref 36.0–46.0)
Hemoglobin: 13 g/dL (ref 12.0–15.0)
Immature Granulocytes: 0 %
Lymphocytes Relative: 23 %
Lymphs Abs: 1.1 10*3/uL (ref 0.7–4.0)
MCH: 32.1 pg (ref 26.0–34.0)
MCHC: 33.4 g/dL (ref 30.0–36.0)
MCV: 96 fL (ref 80.0–100.0)
Monocytes Absolute: 0.4 10*3/uL (ref 0.1–1.0)
Monocytes Relative: 9 %
Neutro Abs: 3 10*3/uL (ref 1.7–7.7)
Neutrophils Relative %: 61 %
Platelets: 143 10*3/uL — ABNORMAL LOW (ref 150–400)
RBC: 4.05 MIL/uL (ref 3.87–5.11)
RDW: 12.6 % (ref 11.5–15.5)
WBC: 4.9 10*3/uL (ref 4.0–10.5)
nRBC: 0 % (ref 0.0–0.2)

## 2022-03-04 LAB — COMPREHENSIVE METABOLIC PANEL
ALT: 16 U/L (ref 0–44)
AST: 23 U/L (ref 15–41)
Albumin: 3.8 g/dL (ref 3.5–5.0)
Alkaline Phosphatase: 69 U/L (ref 38–126)
Anion gap: 6 (ref 5–15)
BUN: 22 mg/dL (ref 8–23)
CO2: 28 mmol/L (ref 22–32)
Calcium: 8.9 mg/dL (ref 8.9–10.3)
Chloride: 104 mmol/L (ref 98–111)
Creatinine, Ser: 0.63 mg/dL (ref 0.44–1.00)
GFR, Estimated: >60 mL/min (ref >60)
Glucose, Bld: 101 mg/dL — ABNORMAL HIGH (ref 70–99)
Potassium: 4 mmol/L (ref 3.5–5.1)
Sodium: 138 mmol/L (ref 135–145)
Total Bilirubin: 1 mg/dL (ref 0.3–1.2)
Total Protein: 6.9 g/dL (ref 6.5–8.1)

## 2022-03-04 LAB — VITAMIN D 25 HYDROXY (VIT D DEFICIENCY, FRACTURES): Vit D, 25-Hydroxy: 47.31 ng/mL (ref 30–100)

## 2022-03-05 ENCOUNTER — Ambulatory Visit: Payer: BC Managed Care – PPO | Admitting: Nurse Practitioner

## 2022-03-05 ENCOUNTER — Encounter: Payer: Self-pay | Admitting: Nurse Practitioner

## 2022-03-05 ENCOUNTER — Other Ambulatory Visit (HOSPITAL_COMMUNITY): Payer: BC Managed Care – PPO

## 2022-03-05 VITALS — BP 150/73 | HR 53 | Temp 97.7°F | Ht 64.0 in | Wt 222.4 lb

## 2022-03-05 DIAGNOSIS — I10 Essential (primary) hypertension: Secondary | ICD-10-CM | POA: Diagnosis not present

## 2022-03-05 DIAGNOSIS — F419 Anxiety disorder, unspecified: Secondary | ICD-10-CM

## 2022-03-05 DIAGNOSIS — F321 Major depressive disorder, single episode, moderate: Secondary | ICD-10-CM

## 2022-03-05 NOTE — Assessment & Plan Note (Signed)
Anxiety well controlled on Lexapro 10 mg tablet by mouth daily.  No changes necessary.  Completed GAD-7.=0 Education hand out provided, return in one year

## 2022-03-05 NOTE — Progress Notes (Signed)
Established Patient Office Visit  Subjective   Patient ID: Anna Carney, female    DOB: 10-23-1950  Age: 71 y.o. MRN: [id number]  Chief Complaint  Patient presents with   Medical Management of Chronic Issues    HPI   Pt presents for follow up of hypertension. Patient was diagnosed in 12/27/2019.. The patient is tolerating the medication well without side effects. Compliance with treatment has been good; including taking medication as directed , maintains a healthy diet and regular exercise regimen , and following up as directed.   Depression, Follow-up  She  was last seen for this 6 months ago. Changes made at last visit include Lexapro 10 mg tablet by mouth daily..   She reports good compliance with treatment. She is not having side effects.   She reports good tolerance of treatment. Current symptoms include:  phq-9 =3 She feels she is Improved since last visit.     03/05/2022    8:07 AM 09/05/2021    9:04 AM 03/05/2021    8:41 AM  Depression screen PHQ 2/9  Decreased Interest 0 0 0  Down, Depressed, Hopeless 0 0 0  PHQ - 2 Score 0 0 0  Altered sleeping '2 2 3  '$ Tired, decreased energy '1 2 1  '$ Change in appetite 0 0 0  Feeling bad or failure about yourself  0 0 0  Trouble concentrating 0 0 0  Moving slowly or fidgety/restless 0 0 0  Suicidal thoughts 0 0 0  PHQ-9 Score '3 4 4  '$ Difficult doing work/chores Not difficult at all Not difficult at all Not difficult at all    Anxiety, Follow-up  She was last seen for anxiety 6 months ago. Changes made at last visit include Lexapro 10 mg tablet by mouth daily..   She reports good compliance with treatment. She reports good tolerance of treatment. She is not having side effects.   She feels her anxiety is almost resolved and Improved since last visit.  Symptoms: No chest pain No difficulty concentrating  No dizziness No fatigue  No feelings of losing control No insomnia  No irritable No palpitations  No panic  attacks No racing thoughts  No shortness of breath No sweating  No tremors/shakes    GAD-7 Results    03/05/2022    8:07 AM 09/05/2021    9:04 AM 03/05/2021    8:41 AM  GAD-7 Generalized Anxiety Disorder Screening Tool  1. Feeling Nervous, Anxious, or on Edge 0 0 0  2. Not Being Able to Stop or Control Worrying 0 0 0  3. Worrying Too Much About Different Things 0 0 0  4. Trouble Relaxing 0 0 0  5. Being So Restless it's Hard To Sit Still 0 0 0  6. Becoming Easily Annoyed or Irritable 0 0 0  7. Feeling Afraid As If Something Awful Might Happen 0 0 0  Total GAD-7 Score 0 0 0  Difficulty At Work, Home, or Getting  Along With Others? Not difficult at all Not difficult at all     PHQ-9 Scores    03/05/2022    8:07 AM 09/05/2021    9:04 AM 03/05/2021    8:41 AM  PHQ9 SCORE ONLY  PHQ-9 Total Score '3 4 4     '$ Patient Active Problem List   Diagnosis Date Noted   Anxiety 10/20/2020   Depression, major, single episode, moderate ([place]) 10/20/2020   Edema leg 04/27/2020   Essential hypertension 12/27/2019   Prophylactic use of  agents affecting estrogen receptors or levels 05/17/2019   Long term current use of aromatase inhibitor 05/17/2019   Depression, major, single episode, mild ([place]) 12/25/2018   S/P mastectomy, right 11/12/2018   Malignant neoplasm of lower-outer quadrant of left breast of female, estrogen receptor positive ([place]) 10/27/2018   Acute non intractable tension-type headache 10/22/2018   Chronic fatigue 10/22/2018   Anxiety disorder due to medical condition 10/22/2018   Elevated BP without diagnosis of hypertension 04/01/2017   Past Medical History:  Diagnosis Date   Anxiety    Arthritis    Cancer ([place]) 10/2018   left breast IDC   Hypertension    Shingles    Past Surgical History:  Procedure Laterality Date   CHOLECYSTECTOMY     MASTECTOMY W/ SENTINEL NODE BIOPSY Left 11/12/2018   Procedure: LEFT MASTECTOMY WITH LEFT AXILLARY SENTINEL LYMPH NODE BIOPSY;   Surgeon: Rolm Bookbinder, MD;  Location: [facility];  Service: General;  Laterality: Left;   Social History   Tobacco Use   Smoking status: Former   Smokeless tobacco: Never  Scientific laboratory technician Use: Never used  Substance Use Topics   Alcohol use: Yes    Comment: rarely   Drug use: No   Social History   Socioeconomic History   Marital status: Single    Spouse name: Not on file   Number of children: Not on file   Years of education: Not on file   Highest education level: Not on file  Occupational History   Occupation: [employer]    Comment: in garden center   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  Tobacco Use   Smoking status: Former   Smokeless tobacco: Never  Scientific laboratory technician Use: Never used  Substance and Sexual Activity   Alcohol use: Yes    Comment: rarely   Drug use: No   Sexual activity: Not Currently    Birth control/protection: Post-menopausal  Other Topics Concern   Not on file  Social History Narrative   Not on file   Social Determinants of Health   Financial Resource Strain: Low Risk  (06/20/2020)   Overall Financial Resource Strain (CARDIA)    Difficulty of Paying Living Expenses: Not hard at all  Food Insecurity: No Food Insecurity (06/20/2020)   Hunger Vital Sign    Worried About Running Out of Food in the Last Year: Never true    Ran Out of Food in the Last Year: Never true  Transportation Needs: No Transportation Needs (06/20/2020)   PRAPARE - Hydrologist (Medical): No    Lack of Transportation (Non-Medical): No  Physical Activity: Inactive (06/20/2020)   Exercise Vital Sign    Days of Exercise per Week: 0 days    Minutes of Exercise per Session: 0 min  Stress: Stress Concern Present (06/20/2020)   [place]    Feeling of Stress : To some extent  Social Connections: Socially  Isolated (06/20/2020)   Social Connection and Isolation Panel [NHANES]    Frequency of Communication with Friends and Family: More than three times a week    Frequency of Social Gatherings with Friends and Family: More than three times a week    Attends Religious Services: Never    Marine scientist or Organizations: No    Attends Archivist Meetings: Never    Marital Status: Never married  Intimate Partner Violence: Not At Risk (06/20/2020)   Humiliation, Afraid, Rape, and Kick questionnaire    Fear of Current or Ex-Partner: No    Emotionally Abused: No    Physically Abused: No    Sexually Abused: No   Family Status  Relation Name Status   Mother  Deceased   Father  Deceased   Sister  Alive   Brother half Deceased   Brother  Alive   Daughter  Alive   Daughter step Alive   Mat Aunt  Alive   Family History  Problem Relation Age of Onset   Arthritis Mother    Diabetes Mother    Stroke Mother    Asthma Father    Colon cancer Brother    Arthritis Daughter    Thyroid disease Daughter    Breast cancer Maternal Aunt    Allergies  Allergen Reactions   Sulfa Antibiotics Nausea Only      Review of Systems  Constitutional: Negative.  Negative for chills and fever.  HENT: Negative.    Eyes: Negative.   Respiratory: Negative.    Cardiovascular: Negative.   Genitourinary: Negative.   Skin: Negative.  Negative for itching and rash.  Neurological: Negative.   Psychiatric/Behavioral:  Positive for depression.   All other systems reviewed and are negative.     Objective:     BP (!) 150/73   Pulse (!) 53   Temp 97.7 F (36.5 C) (Temporal)   Ht '5\' 4"'$  (1.626 m)   Wt 222 lb 6.4 oz (100.9 kg)   SpO2 97%   BMI 38.17 kg/m  BP Readings from Last 3 Encounters:  03/05/22 (!) 150/73  09/05/21 120/67  09/04/21 (!) 141/71   Wt Readings from Last 3 Encounters:  03/05/22 222 lb 6.4 oz (100.9 kg)  09/05/21 223 lb 6 oz (101.3 kg)  09/04/21 222 lb 6.4 oz (100.9  kg)      Physical Exam Vitals and nursing note reviewed.  Constitutional:      Appearance: Normal appearance.  HENT:     Head: Normocephalic.     Right Ear: External ear normal.     Left Ear: External ear normal.     Nose: Nose normal.  Cardiovascular:     Rate and Rhythm: Normal rate and regular rhythm.     Pulses: Normal pulses.     Heart sounds: Normal heart sounds.  Pulmonary:     Effort: Pulmonary effort is normal.     Breath sounds: Normal breath sounds.  Abdominal:     General: Bowel sounds are normal.  Skin:    General: Skin is warm.     Findings: No erythema or rash.  Neurological:     General: No focal deficit present.     Mental Status: She is alert and oriented to person, place, and time.  Psychiatric:        Mood and Affect: Mood is anxious and depressed.        Behavior: Behavior normal.      No results found for any visits on 03/05/22.  Last CBC Lab Results  Component Value Date   WBC 4.9 03/04/2022   HGB 13.0 03/04/2022   HCT 38.9 03/04/2022   MCV 96.0 03/04/2022   MCH 32.1 03/04/2022   RDW 12.6 03/04/2022   PLT 143 (L) 36/14/4315   Last metabolic panel Lab Results  Component Value Date   GLUCOSE 101 (H) 03/04/2022   NA 138 03/04/2022   K 4.0 03/04/2022  CL 104 03/04/2022   CO2 28 03/04/2022   BUN 22 03/04/2022   CREATININE 0.63 03/04/2022   GFRNONAA >60 03/04/2022   CALCIUM 8.9 03/04/2022   PROT 6.9 03/04/2022   ALBUMIN 3.8 03/04/2022   LABGLOB 2.4 10/20/2020   AGRATIO 1.8 10/20/2020   BILITOT 1.0 03/04/2022   ALKPHOS 69 03/04/2022   AST 23 03/04/2022   ALT 16 03/04/2022   ANIONGAP 6 03/04/2022   Last lipids Lab Results  Component Value Date   CHOL 146 10/20/2020   HDL 65 10/20/2020   LDLCALC 70 10/20/2020   TRIG 52 10/20/2020   CHOLHDL 2.2 10/20/2020   Last hemoglobin A1c Lab Results  Component Value Date   HGBA1C 5.1 10/21/2018   Last thyroid functions Lab Results  Component Value Date   TSH 0.416 (L)  10/21/2018   T4TOTAL 9.5 10/21/2018      The 10-year ASCVD risk score (Arnett DK, et al., 2019) is: 11.7%    Assessment & Plan:   Problem List Items Addressed This Visit       Cardiovascular and Mediastinum   Essential hypertension - Primary    Patient is hypertension is not controlled in clinic today. Blood pressure value 175/80, repeat 150/73.  Patient reports she took her blood pressure medication about an hour prior to visit.  Education provided to patient to take blood pressure values daily for the next 7 days and return to clinic.  Patient will benefit from increasing losartan from 50 mg tablet 100 mg tablet by mouth daily. Advised to decrease sodium in diet, increase hydration weight loss and exercise as tolerated        Other   Anxiety    Anxiety well controlled on Lexapro 10 mg tablet by mouth daily.  No changes necessary.  Completed GAD-7.=0 Education hand out provided, return in one year      Depression, major, single episode, moderate ([place])    Depression well controlled on current medication no changes necessary. Completed PHQ-9.=3 follow-up in 1 year. Education hand out given to patient       Return in about 6 months (around 09/05/2022) for Chronic disease management.    Ivy Lynn, NP

## 2022-03-05 NOTE — Patient Instructions (Signed)
Hypertension, Adult ?Hypertension is another name for high blood pressure. High blood pressure forces your heart to work harder to pump blood. This can cause problems over time. ?There are two numbers in a blood pressure reading. There is a top number (systolic) over a bottom number (diastolic). It is best to have a blood pressure that is below 120/80. ?What are the causes? ?The cause of this condition is not known. Some other conditions can lead to high blood pressure. ?What increases the risk? ?Some lifestyle factors can make you more likely to develop high blood pressure: ?Smoking. ?Not getting enough exercise or physical activity. ?Being overweight. ?Having too much fat, sugar, calories, or salt (sodium) in your diet. ?Drinking too much alcohol. ?Other risk factors include: ?Having any of these conditions: ?Heart disease. ?Diabetes. ?High cholesterol. ?Kidney disease. ?Obstructive sleep apnea. ?Having a family history of high blood pressure and high cholesterol. ?Age. The risk increases with age. ?Stress. ?What are the signs or symptoms? ?High blood pressure may not cause symptoms. Very high blood pressure (hypertensive crisis) may cause: ?Headache. ?Fast or uneven heartbeats (palpitations). ?Shortness of breath. ?Nosebleed. ?Vomiting or feeling like you may vomit (nauseous). ?Changes in how you see. ?Very bad chest pain. ?Feeling dizzy. ?Seizures. ?How is this treated? ?This condition is treated by making healthy lifestyle changes, such as: ?Eating healthy foods. ?Exercising more. ?Drinking less alcohol. ?Your doctor may prescribe medicine if lifestyle changes do not help enough and if: ?Your top number is above 130. ?Your bottom number is above 80. ?Your personal target blood pressure may vary. ?Follow these instructions at home: ?Eating and drinking ? ?If told, follow the DASH eating plan. To follow this plan: ?Fill one half of your plate at each meal with fruits and vegetables. ?Fill one fourth of your plate  at each meal with whole grains. Whole grains include whole-wheat pasta, brown rice, and whole-grain bread. ?Eat or drink low-fat dairy products, such as skim milk or low-fat yogurt. ?Fill one fourth of your plate at each meal with low-fat (lean) proteins. Low-fat proteins include fish, chicken without skin, eggs, beans, and tofu. ?Avoid fatty meat, cured and processed meat, or chicken with skin. ?Avoid pre-made or processed food. ?Limit the amount of salt in your diet to less than 1,500 mg each day. ?Do not drink alcohol if: ?Your doctor tells you not to drink. ?You are pregnant, may be pregnant, or are planning to become pregnant. ?If you drink alcohol: ?Limit how much you have to: ?0-1 drink a day for women. ?0-2 drinks a day for men. ?Know how much alcohol is in your drink. In the [place] one drink equals one 12 oz bottle of beer (355 mL), one 5 oz glass of wine (148 mL), or one 1? oz glass of hard liquor (44 mL). ?Lifestyle ? ?Work with your doctor to stay at a healthy weight or to lose weight. Ask your doctor what the best weight is for you. ?Get at least 30 minutes of exercise that causes your heart to beat faster (aerobic exercise) most days of the week. This may include walking, swimming, or biking. ?Get at least 30 minutes of exercise that strengthens your muscles (resistance exercise) at least 3 days a week. This may include lifting weights or doing Pilates. ?Do not smoke or use any products that contain nicotine or tobacco. If you need help quitting, ask your doctor. ?Check your blood pressure at home as told by your doctor. ?Keep all follow-up visits. ?Medicines ?Take over-the-counter and prescription medicines   only as told by your doctor. Follow directions carefully. ?Do not skip doses of blood pressure medicine. The medicine does not work as well if you skip doses. Skipping doses also puts you at risk for problems. ?Ask your doctor about side effects or reactions to medicines that you should watch  for. ?Contact a doctor if: ?You think you are having a reaction to the medicine you are taking. ?You have headaches that keep coming back. ?You feel dizzy. ?You have swelling in your ankles. ?You have trouble with your vision. ?Get help right away if: ?You get a very bad headache. ?You start to feel mixed up (confused). ?You feel weak or numb. ?You feel faint. ?You have very bad pain in your: ?Chest. ?Belly (abdomen). ?You vomit more than once. ?You have trouble breathing. ?These symptoms may be an emergency. Get help right away. Call 911. ?Do not wait to see if the symptoms will go away. ?Do not drive yourself to the hospital. ?Summary ?Hypertension is another name for high blood pressure. ?High blood pressure forces your heart to work harder to pump blood. ?For most people, a normal blood pressure is less than 120/80. ?Making healthy choices can help lower blood pressure. If your blood pressure does not get lower with healthy choices, you may need to take medicine. ?This information is not intended to replace advice given to you by your health care provider. Make sure you discuss any questions you have with your health care provider. ?Document Revised: 05/24/2021 Document Reviewed: 05/24/2021 ?Elsevier Patient Education ? 2023 [employer] ? ?

## 2022-03-05 NOTE — Assessment & Plan Note (Signed)
Depression well controlled on current medication no changes necessary. Completed PHQ-9.=3 follow-up in 1 year. Education hand out given to patient

## 2022-03-05 NOTE — Assessment & Plan Note (Signed)
Patient is hypertension is not controlled in clinic today. Blood pressure value 175/80, repeat 150/73.  Patient reports she took her blood pressure medication about an hour prior to visit.  Education provided to patient to take blood pressure values daily for the next 7 days and return to clinic.  Patient will benefit from increasing losartan from 50 mg tablet 100 mg tablet by mouth daily. Advised to decrease sodium in diet, increase hydration weight loss and exercise as tolerated

## 2022-03-12 ENCOUNTER — Inpatient Hospital Stay (HOSPITAL_BASED_OUTPATIENT_CLINIC_OR_DEPARTMENT_OTHER): Payer: BC Managed Care – PPO | Admitting: Hematology

## 2022-03-12 VITALS — BP 162/87 | HR 81 | Temp 98.4°F | Resp 18 | Wt 221.0 lb

## 2022-03-12 DIAGNOSIS — Z170 Estrogen receptor positive status [ER+]: Secondary | ICD-10-CM | POA: Diagnosis not present

## 2022-03-12 DIAGNOSIS — C50512 Malignant neoplasm of lower-outer quadrant of left female breast: Secondary | ICD-10-CM | POA: Diagnosis not present

## 2022-03-12 NOTE — Patient Instructions (Addendum)
[place] at [facility] Discharge Instructions  You were seen and examined today by Dr. Delton Coombes.  Dr. Delton Coombes discussed your most recent lab work and most recent mammogram and everything looks good.  Continue taking Anastrozole, Calcium and Vitamin D as prescribed.  Follow-up as scheduled in 6 months.    Thank you for choosing [place] at [facility] to provide your oncology and hematology care.  To afford each patient quality time with our provider, please arrive at least 15 minutes before your scheduled appointment time.   If you have a lab appointment with the [facility] please come in thru the Main Entrance and check in at the main information desk.  You need to re-schedule your appointment should you arrive 10 or more minutes late.  We strive to give you quality time with our providers, and arriving late affects you and other patients whose appointments are after yours.  Also, if you no show three or more times for appointments you may be dismissed from the clinic at the providers discretion.     Again, thank you for choosing [facility].  Our hope is that these requests will decrease the amount of time that you wait before being seen by our physicians.       _____________________________________________________________  Should you have questions after your visit to [facility], please contact our office at [phone] and follow the prompts.  Our office hours are 8:00 a.m. and 4:30 p.m. Monday - Friday.  Please note that voicemails left after 4:00 p.m. may not be returned until the following business day.  We are closed weekends and major holidays.  You do have access to a nurse 24-7, just call the main number to the clinic [phone] and do not press any options, hold on the line and a nurse will answer the phone.    For prescription refill requests, have your pharmacy contact our office and allow  72 hours.

## 2022-03-12 NOTE — Progress Notes (Signed)
[address] [place], [place] [address]   Patient Care Team: Ivy Lynn, NP as PCP - General (Nurse Practitioner)  SUMMARY OF ONCOLOGIC HISTORY: Oncology History  Malignant neoplasm of lower-outer quadrant of left breast of female, estrogen receptor positive ([facility])  10/27/2018 Initial Diagnosis   Malignant neoplasm of lower-outer quadrant of left breast of female, estrogen receptor positive ([place])   12/10/2018 Cancer Staging   Staging form: Breast, AJCC 8th Edition - Clinical stage from 12/10/2018: Stage IIIB (cT4b, cN0, cM0, G2, ER+, PR+, HER2-, Oncotype DX score: 2) - Signed by Derek Jack, MD on 12/10/2018     CHIEF COMPLIANT: Follow up for left breast cancer   INTERVAL HISTORY: [name] is a 71 y.o. female here today for follow up of her left breast cancer. Her last visit was on 09/04/2021.   Today she reports feeling good. She is taking anastrozole and reports tolerating it well. She reports hot flashes at night. She denies new joint pains. She has knee pains due to a history of arthritis. She reports continued numbness under her left arm. She is taking calcium and vitamin D.   REVIEW OF SYSTEMS:   Review of Systems  Constitutional:  Negative for appetite change and fatigue.  Gastrointestinal:  Positive for constipation.  Endocrine: Positive for hot flashes.  Musculoskeletal:  Positive for arthralgias (knees).  Neurological:  Positive for headaches.  Psychiatric/Behavioral:  Positive for sleep disturbance.   All other systems reviewed and are negative.   I have reviewed the past medical history, past surgical history, social history and family history with the patient and they are unchanged from previous note.   ALLERGIES:   is allergic to sulfa antibiotics.   MEDICATIONS:  Current Outpatient Medications  Medication Sig Dispense Refill   acetaminophen (TYLENOL) 325 MG tablet Take 650 mg by mouth every 6 (six) hours as  needed.     anastrozole (ARIMIDEX) 1 MG tablet Take 1 tablet by mouth once daily 30 tablet 6   Ascorbic Acid (VITAMIN C) 1000 MG tablet Take 2,000 mg by mouth daily.     calcium carbonate (OS-CAL) 1250 (500 Ca) MG chewable tablet Chew 1 tablet by mouth 2 (two) times daily.      celecoxib (CELEBREX) 200 MG capsule Take 200 mg by mouth daily.     cholecalciferol (VITAMIN D3) 25 MCG (1000 UT) tablet Take 2,000 Units by mouth daily.     diclofenac Sodium (VOLTAREN) 1 % GEL Apply 2 g topically 4 (four) times daily.      Docusate Calcium (STOOL SOFTENER PO) Take by mouth.     escitalopram (LEXAPRO) 5 MG tablet Take 1 tablet (5 mg total) by mouth daily. 90 tablet 3   losartan (COZAAR) 50 MG tablet Take 1 tablet (50 mg total) by mouth daily. 90 tablet 3   Multiple Vitamin (MULTIVITAMIN) tablet Take 1 tablet by mouth daily.     No current facility-administered medications for this visit.     PHYSICAL EXAMINATION: Performance status (ECOG): 1 - Symptomatic but completely ambulatory  There were no vitals filed for this visit. Wt Readings from Last 3 Encounters:  03/05/22 222 lb 6.4 oz (100.9 kg)  09/05/21 223 lb 6 oz (101.3 kg)  09/04/21 222 lb 6.4 oz (100.9 kg)   Physical Exam Vitals reviewed.  Constitutional:      Appearance: Normal appearance.  Cardiovascular:     Rate and Rhythm: Normal rate and regular rhythm.  Pulses: Normal pulses.     Heart sounds: Normal heart sounds.  Pulmonary:     Effort: Pulmonary effort is normal.     Breath sounds: Normal breath sounds.  Chest:  Breasts:    Right: Normal. No swelling, bleeding, inverted nipple, mass, nipple discharge, skin change or tenderness.     Left: Absent. No swelling, bleeding, mass, skin change (mastectomy site WNL) or tenderness.  Neurological:     General: No focal deficit present.     Mental Status: She is alert and oriented to person, place, and time.  Psychiatric:        Mood and Affect: Mood normal.        Behavior:  Behavior normal.     Breast Exam Chaperone: Thana Ates     LABORATORY DATA:  I have reviewed the data as listed    Latest Ref Rng & Units 03/04/2022   10:49 AM 08/28/2021    2:01 PM 02/09/2021   11:46 AM  CMP  Glucose 70 - 99 mg/dL 101  117  100   BUN 8 - 23 mg/dL '22  17  19   ' Creatinine 0.44 - 1.00 mg/dL 0.63  0.82  0.66   Sodium 135 - 145 mmol/L 138  141  138   Potassium 3.5 - 5.1 mmol/L 4.0  4.0  4.0   Chloride 98 - 111 mmol/L 104  106  104   CO2 22 - 32 mmol/L '28  27  28   ' Calcium 8.9 - 10.3 mg/dL 8.9  9.5  8.9   Total Protein 6.5 - 8.1 g/dL 6.9  7.2  6.8   Total Bilirubin 0.3 - 1.2 mg/dL 1.0  0.5  0.7   Alkaline Phos 38 - 126 U/L 69  95  68   AST 15 - 41 U/L '23  28  23   ' ALT 0 - 44 U/L '16  20  17    ' Lab Results  Component Value Date   CAN153 22.0 08/28/2021   CAN153 21.2 02/09/2021   CAN153 21.9 06/13/2020   Lab Results  Component Value Date   WBC 4.9 03/04/2022   HGB 13.0 03/04/2022   HCT 38.9 03/04/2022   MCV 96.0 03/04/2022   PLT 143 (L) 03/04/2022   NEUTROABS 3.0 03/04/2022    ASSESSMENT:  1.  Stage IIIb (T4N0) left breast IDC, ER/PR positive, HER-2 negative: -Biopsy of the left breast on 10/20/2018 showed IDC.  Left axillary core biopsy was negative. -Left mastectomy on 11/12/2018, 11 cm IDC, grade 2, margins negative, 0/14 lymph node positive, ER/PR positive, HER-2 2+ by IHC and negative by FISH, Ki-67 10%. -Oncotype DX recurrence score of 2. -Anastrozole started on 12/10/2018. -XRT from 02/01/2019-03/18/2019. -Right breast mammogram on 12/16/2019 shows BI-RADS 1.   2.  Osteopenia: -DEXA scan on 02/09/2019 shows T score of -2. 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   PLAN:  1.  Stage IIIb (T4N0) left breast IDC, ER/PR positive, HER-2 negative: - Physical exam today shows left mastectomy site is within normal limits.  Right breast has no palpable masses. - She is tolerating anastrozole very well.  She has occasional hot flashes which are at  nighttime and tolerable. - Reviewed labs today which showed normal LFTs.  CBC was grossly normal. - Mammogram of the right breast on 03/04/2022 was BI-RADS Category 1. - RTC 6 months for follow-up with repeat labs and exam.   2.  Osteopenia: - DEXA scan on 02/09/2021 with T score -2.1. -  Continue calcium and vitamin D supplements.  Vitamin D level is 47.  Breast Cancer therapy associated bone loss: I have recommended calcium, Vitamin D and weight bearing exercises.  Orders placed this encounter:  No orders of the defined types were placed in this encounter.   The patient has a good understanding of the overall plan. She agrees with it. She will call with any problems that may develop before the next visit here.  Derek Jack, MD [place] [phone]   I, Thana Ates, am acting as a scribe for Dr. Derek Jack.  I, Derek Jack MD, have reviewed the above documentation for accuracy and completeness, and I agree with the above.

## 2022-03-21 ENCOUNTER — Other Ambulatory Visit: Payer: Self-pay | Admitting: *Deleted

## 2022-03-21 ENCOUNTER — Other Ambulatory Visit (HOSPITAL_COMMUNITY): Payer: Self-pay | Admitting: Hematology

## 2022-03-21 DIAGNOSIS — C50512 Malignant neoplasm of lower-outer quadrant of left female breast: Secondary | ICD-10-CM

## 2022-03-21 NOTE — Telephone Encounter (Signed)
Anastrozole refill approved.  Per last ovn, patient is tolerating and is to continue on therapy.

## 2022-04-03 ENCOUNTER — Encounter: Payer: Self-pay | Admitting: Nurse Practitioner

## 2022-04-03 NOTE — Telephone Encounter (Signed)
Blood pressure is controlled and within acceptable limits

## 2022-04-10 LAB — COLOGUARD: COLOGUARD: NEGATIVE

## 2022-04-26 ENCOUNTER — Other Ambulatory Visit (HOSPITAL_COMMUNITY): Payer: Self-pay | Admitting: Hematology

## 2022-04-26 DIAGNOSIS — C50512 Malignant neoplasm of lower-outer quadrant of left female breast: Secondary | ICD-10-CM

## 2022-05-28 ENCOUNTER — Other Ambulatory Visit (HOSPITAL_COMMUNITY): Payer: Self-pay | Admitting: Physician Assistant

## 2022-05-28 DIAGNOSIS — C50512 Malignant neoplasm of lower-outer quadrant of left female breast: Secondary | ICD-10-CM

## 2022-06-03 ENCOUNTER — Other Ambulatory Visit: Payer: Self-pay

## 2022-06-03 DIAGNOSIS — Z170 Estrogen receptor positive status [ER+]: Secondary | ICD-10-CM

## 2022-06-03 MED ORDER — ANASTROZOLE 1 MG PO TABS: 1.0000 mg | ORAL_TABLET | Freq: Every day | ORAL | 3 refills | Status: DC

## 2022-06-05 ENCOUNTER — Other Ambulatory Visit: Payer: Self-pay

## 2022-06-05 DIAGNOSIS — Z170 Estrogen receptor positive status [ER+]: Secondary | ICD-10-CM

## 2022-06-05 MED ORDER — ANASTROZOLE 1 MG PO TABS: 1.0000 mg | ORAL_TABLET | Freq: Every day | ORAL | 3 refills | Status: DC

## 2022-06-07 ENCOUNTER — Other Ambulatory Visit: Payer: Self-pay | Admitting: *Deleted

## 2022-06-07 DIAGNOSIS — Z170 Estrogen receptor positive status [ER+]: Secondary | ICD-10-CM

## 2022-09-03 ENCOUNTER — Ambulatory Visit: Payer: BC Managed Care – PPO | Admitting: Nurse Practitioner

## 2022-09-06 ENCOUNTER — Ambulatory Visit: Payer: BC Managed Care – PPO | Admitting: Nurse Practitioner

## 2022-09-10 ENCOUNTER — Ambulatory Visit: Payer: BC Managed Care – PPO | Admitting: Family Medicine

## 2022-09-10 ENCOUNTER — Encounter: Payer: Self-pay | Admitting: Family Medicine

## 2022-09-10 ENCOUNTER — Inpatient Hospital Stay: Payer: BC Managed Care – PPO | Attending: Hematology

## 2022-09-10 VITALS — BP 135/77 | HR 74 | Temp 97.7°F | Ht 64.0 in | Wt 228.4 lb

## 2022-09-10 DIAGNOSIS — F321 Major depressive disorder, single episode, moderate: Secondary | ICD-10-CM | POA: Diagnosis not present

## 2022-09-10 DIAGNOSIS — Z170 Estrogen receptor positive status [ER+]: Secondary | ICD-10-CM | POA: Diagnosis not present

## 2022-09-10 DIAGNOSIS — M8580 Other specified disorders of bone density and structure, unspecified site: Secondary | ICD-10-CM | POA: Diagnosis not present

## 2022-09-10 DIAGNOSIS — F419 Anxiety disorder, unspecified: Secondary | ICD-10-CM | POA: Diagnosis not present

## 2022-09-10 DIAGNOSIS — C50512 Malignant neoplasm of lower-outer quadrant of left female breast: Secondary | ICD-10-CM

## 2022-09-10 DIAGNOSIS — Z923 Personal history of irradiation: Secondary | ICD-10-CM

## 2022-09-10 DIAGNOSIS — Z79811 Long term (current) use of aromatase inhibitors: Secondary | ICD-10-CM | POA: Insufficient documentation

## 2022-09-10 DIAGNOSIS — I10 Essential (primary) hypertension: Secondary | ICD-10-CM

## 2022-09-10 LAB — COMPREHENSIVE METABOLIC PANEL
ALT: 17 U/L (ref 0–44)
AST: 26 U/L (ref 15–41)
Albumin: 4.1 g/dL (ref 3.5–5.0)
Alkaline Phosphatase: 72 U/L (ref 38–126)
Anion gap: 9 (ref 5–15)
BUN: 17 mg/dL (ref 8–23)
CO2: 26 mmol/L (ref 22–32)
Calcium: 9 mg/dL (ref 8.9–10.3)
Chloride: 101 mmol/L (ref 98–111)
Creatinine, Ser: 0.77 mg/dL (ref 0.44–1.00)
GFR, Estimated: >60 mL/min (ref >60)
Glucose, Bld: 109 mg/dL — ABNORMAL HIGH (ref 70–99)
Potassium: 3.4 mmol/L — ABNORMAL LOW (ref 3.5–5.1)
Sodium: 136 mmol/L (ref 135–145)
Total Bilirubin: 0.7 mg/dL (ref 0.3–1.2)
Total Protein: 7.4 g/dL (ref 6.5–8.1)

## 2022-09-10 LAB — CBC WITH DIFFERENTIAL/PLATELET
Abs Immature Granulocytes: 0.01 10*3/uL (ref 0.00–0.07)
Basophils Absolute: 0.1 10*3/uL (ref 0.0–0.1)
Basophils Relative: 1 %
Eosinophils Absolute: 0.4 10*3/uL (ref 0.0–0.5)
Eosinophils Relative: 5 %
HCT: 41.3 % (ref 36.0–46.0)
Hemoglobin: 13.7 g/dL (ref 12.0–15.0)
Immature Granulocytes: 0 %
Lymphocytes Relative: 24 %
Lymphs Abs: 1.6 10*3/uL (ref 0.7–4.0)
MCH: 31.5 pg (ref 26.0–34.0)
MCHC: 33.2 g/dL (ref 30.0–36.0)
MCV: 94.9 fL (ref 80.0–100.0)
Monocytes Absolute: 0.5 10*3/uL (ref 0.1–1.0)
Monocytes Relative: 8 %
Neutro Abs: 4.2 10*3/uL (ref 1.7–7.7)
Neutrophils Relative %: 62 %
Platelets: 163 10*3/uL (ref 150–400)
RBC: 4.35 MIL/uL (ref 3.87–5.11)
RDW: 12.3 % (ref 11.5–15.5)
WBC: 6.8 10*3/uL (ref 4.0–10.5)
nRBC: 0 % (ref 0.0–0.2)

## 2022-09-10 MED ORDER — ESCITALOPRAM OXALATE 5 MG PO TABS: 5.0000 mg | ORAL_TABLET | Freq: Every day | ORAL | 1 refills | Status: DC

## 2022-09-10 MED ORDER — LOSARTAN POTASSIUM 50 MG PO TABS: 50.0000 mg | ORAL_TABLET | Freq: Every day | ORAL | 1 refills | Status: DC

## 2022-09-10 NOTE — Patient Instructions (Signed)

## 2022-09-10 NOTE — Progress Notes (Signed)
Subjective:  Patient ID: Anna Carney, female    DOB: 10/29/1950, 72 y.o.   MRN: [id number]  Patient Care Team: Baruch Gouty, FNP as PCP - General (Family Medicine)   Chief Complaint:  Establish Care ([facility] patient )   HPI: Anna Carney is a 72 y.o. female presenting on 09/10/2022 for Establish Care ([facility] patient )   Pt presents today for management of chronic medical conditions and to establish care with new provider. States overall she is doing well. No specific complaints or concerns today.  1. Anxiety 2. Depression, major, single episode, moderate ([facility]) Doing well on current regimen. No SI or HI.     09/10/2022    9:21 AM 03/05/2022    8:07 AM 09/05/2021    9:04 AM 03/05/2021    8:41 AM  GAD 7 : Generalized Anxiety Score  Nervous, Anxious, on Edge 0 0 0 0  Control/stop worrying 0 0 0 0  Worry too much - different things 0 0 0 0  Trouble relaxing 0 0 0 0  Restless 0 0 0 0  Easily annoyed or irritable 0 0 0 0  Afraid - awful might happen 0 0 0 0  Total GAD 7 Score 0 0 0 0  Anxiety Difficulty Not difficult at all Not difficult at all Not difficult at all        09/10/2022    9:20 AM 03/05/2022    8:07 AM 09/05/2021    9:04 AM 03/05/2021    8:41 AM 12/04/2020   10:48 AM  Depression screen PHQ 2/9  Decreased Interest 0 0 0 0   Down, Depressed, Hopeless 0 0 0 0   PHQ - 2 Score 0 0 0 0   Altered sleeping 0 '2 2 3   '$ Tired, decreased energy '2 1 2 1   '$ Change in appetite 2 0 0 0   Feeling bad or failure about yourself  0 0 0 0   Trouble concentrating 0 0 0 0   Moving slowly or fidgety/restless 0 0 0 0   Suicidal thoughts 0 0 0 0 0  PHQ-9 Score '4 3 4 4   '$ Difficult doing work/chores Not difficult at all Not difficult at all Not difficult at all Not difficult at all Not difficult at all    3. Essential hypertension Complaint with meds - Yes Current Medications - losartan Checking BP at home - no Exercising Regularly - No Watching Salt intake - Yes Pertinent ROS:   Headache - No Fatigue - No Visual Disturbances - No Chest pain - No Dyspnea - No Palpitations - No LE edema - No They report good compliance with medications and can restate their regimen by memory. No medication side effects.  BP Readings from Last 3 Encounters:  09/10/22 135/77  03/12/22 (!) 162/87  03/05/22 (!) 150/73     4. Morbid obesity ([place]) Does not exercise on a regular basis due to osteoarthritis pain. Does try to follow a healthier diet.   5. Malignant neoplasm of lower-outer quadrant of left breast of female, estrogen receptor positive ([place]) 6. History of radiation therapy Followed by Dr. Delton Coombes on a regular basis. Underwent radiation therapy and is on Anastrozole. Tolerating well. Has follow up with oncology next week.    Relevant past medical, surgical, family, and social history reviewed and updated as indicated.  Allergies and medications reviewed and updated. Data reviewed: Chart in Epic.   Past Medical History:  Diagnosis Date   Anxiety  Arthritis    Cancer ([facility]) 10/2018   left breast IDC   Hypertension    Shingles     Past Surgical History:  Procedure Laterality Date   CHOLECYSTECTOMY     MASTECTOMY W/ SENTINEL NODE BIOPSY Left 11/12/2018   Procedure: LEFT MASTECTOMY WITH LEFT AXILLARY SENTINEL LYMPH NODE BIOPSY;  Surgeon: Rolm Bookbinder, MD;  Location: [facility];  Service: General;  Laterality: Left;    Social History   Socioeconomic History   Marital status: Single    Spouse name: Not on file   Number of children: Not on file   Years of education: Not on file   Highest education level: Not on file  Occupational History   Occupation: [employer]    Comment: in garden center   Occupation: [employer]    Comment: cafeteria and bus driver  Tobacco Use   Smoking status: Former    Types: Cigarettes    Quit date: 1994    Years since quitting: 30.0   Smokeless tobacco: Never  Vaping Use    Vaping Use: Never used  Substance and Sexual Activity   Alcohol use: Yes    Comment: rarely   Drug use: No   Sexual activity: Not Currently    Birth control/protection: Post-menopausal  Other Topics Concern   Not on file  Social History Narrative   Not on file   Social Determinants of Health   Financial Resource Strain: Low Risk  (06/20/2020)   Overall Financial Resource Strain (CARDIA)    Difficulty of Paying Living Expenses: Not hard at all  Food Insecurity: No Food Insecurity (06/20/2020)   Hunger Vital Sign    Worried About Running Out of Food in the Last Year: Never true    [place] in the Last Year: Never true  Transportation Needs: No Transportation Needs (06/20/2020)   PRAPARE - Hydrologist (Medical): No    Lack of Transportation (Non-Medical): No  Physical Activity: Inactive (06/20/2020)   Exercise Vital Sign    Days of Exercise per Week: 0 days    Minutes of Exercise per Session: 0 min  Stress: Stress Concern Present (06/20/2020)   [place]    Feeling of Stress : To some extent  Social Connections: Socially Isolated (06/20/2020)   Social Connection and Isolation Panel [NHANES]    Frequency of Communication with Friends and Family: More than three times a week    Frequency of Social Gatherings with Friends and Family: More than three times a week    Attends Religious Services: Never    Marine scientist or Organizations: No    Attends Archivist Meetings: Never    Marital Status: Never married  Intimate Partner Violence: Not At Risk (06/20/2020)   Humiliation, Afraid, Rape, and Kick questionnaire    Fear of Current or Ex-Partner: No    Emotionally Abused: No    Physically Abused: No    Sexually Abused: No    Outpatient Encounter Medications as of 09/10/2022  Medication Sig   acetaminophen (TYLENOL) 325 MG tablet Take 650 mg by mouth every 6 (six)  hours as needed.   anastrozole (ARIMIDEX) 1 MG tablet Take 1 tablet (1 mg total) by mouth daily.   Ascorbic Acid (VITAMIN C) 1000 MG tablet Take 2,000 mg by mouth daily.   calcium carbonate (OS-CAL) 1250 (500 Ca) MG chewable tablet Chew 1 tablet by mouth  2 (two) times daily.    cholecalciferol (VITAMIN D3) 25 MCG (1000 UT) tablet Take 2,000 Units by mouth daily.   diclofenac Sodium (VOLTAREN) 1 % GEL Apply 2 g topically 4 (four) times daily.    Docusate Calcium (STOOL SOFTENER PO) Take by mouth.   Multiple Vitamin (MULTIVITAMIN) tablet Take 1 tablet by mouth daily.   [DISCONTINUED] escitalopram (LEXAPRO) 5 MG tablet Take 1 tablet (5 mg total) by mouth daily.   [DISCONTINUED] losartan (COZAAR) 50 MG tablet Take 1 tablet (50 mg total) by mouth daily.   escitalopram (LEXAPRO) 5 MG tablet Take 1 tablet (5 mg total) by mouth daily.   losartan (COZAAR) 50 MG tablet Take 1 tablet (50 mg total) by mouth daily.   [DISCONTINUED] celecoxib (CELEBREX) 200 MG capsule Take 200 mg by mouth daily.   No facility-administered encounter medications on file as of 09/10/2022.    Allergies  Allergen Reactions   Sulfa Antibiotics Nausea Only    Review of Systems  Constitutional:  Positive for appetite change and fatigue. Negative for activity change, chills, diaphoresis, fever and unexpected weight change.  HENT: Negative.    Eyes: Negative.  Negative for photophobia and visual disturbance.  Respiratory:  Negative for cough, chest tightness and shortness of breath.   Cardiovascular:  Negative for chest pain, palpitations and leg swelling.  Gastrointestinal:  Negative for abdominal pain, blood in stool, constipation, diarrhea, nausea and vomiting.  Endocrine: Negative.  Negative for polydipsia, polyphagia and polyuria.  Genitourinary:  Negative for decreased urine volume, difficulty urinating, dysuria, frequency and urgency.  Musculoskeletal:  Positive for arthralgias, back pain and joint swelling. Negative  for gait problem, myalgias, neck pain and neck stiffness.  Skin: Negative.   Allergic/Immunologic: Negative.   Neurological:  Negative for dizziness, tremors, seizures, syncope, facial asymmetry, speech difficulty, weakness, light-headedness, numbness and headaches.  Hematological: Negative.   Psychiatric/Behavioral:  Negative for confusion, hallucinations, sleep disturbance and suicidal ideas.   All other systems reviewed and are negative.       Objective:  BP 135/77   Pulse 74   Temp 97.7 F (36.5 C) (Temporal)   Ht '5\' 4"'$  (1.626 m)   Wt 228 lb 6.4 oz (103.6 kg)   SpO2 94%   BMI 39.20 kg/m    Wt Readings from Last 3 Encounters:  09/10/22 228 lb 6.4 oz (103.6 kg)  03/12/22 221 lb (100.2 kg)  03/05/22 222 lb 6.4 oz (100.9 kg)    Physical Exam Vitals and nursing note reviewed.  Constitutional:      General: She is not in acute distress.    Appearance: Normal appearance. She is well-developed and well-groomed. She is morbidly obese. She is not ill-appearing, toxic-appearing or diaphoretic.  HENT:     Head: Normocephalic and atraumatic.     Jaw: There is normal jaw occlusion.     Right Ear: Hearing normal.     Left Ear: Hearing normal.     Nose: Nose normal.     Mouth/Throat:     Lips: Pink.     Mouth: Mucous membranes are moist.     Pharynx: Uvula midline.  Eyes:     General: Lids are normal.     Conjunctiva/sclera: Conjunctivae normal.     Pupils: Pupils are equal, round, and reactive to light.  Neck:     Thyroid: No thyroid mass, thyromegaly or thyroid tenderness.     Vascular: No carotid bruit or JVD.     Trachea: Trachea and phonation normal.  Cardiovascular:  Rate and Rhythm: Normal rate and regular rhythm.     Chest Wall: PMI is not displaced.     Pulses: Normal pulses.     Heart sounds: Normal heart sounds. No murmur heard.    No friction rub. No gallop.  Pulmonary:     Effort: Pulmonary effort is normal. No respiratory distress.     Breath sounds:  Normal breath sounds. No wheezing.  Abdominal:     General: There is no abdominal bruit.     Palpations: There is no hepatomegaly or splenomegaly.  Musculoskeletal:     Cervical back: Normal range of motion and neck supple.     Right lower leg: No edema.     Left lower leg: No edema.  Lymphadenopathy:     Cervical: No cervical adenopathy.  Skin:    General: Skin is warm and dry.     Capillary Refill: Capillary refill takes less than 2 seconds.     Coloration: Skin is not cyanotic, jaundiced or pale.     Findings: No rash.  Neurological:     General: No focal deficit present.     Mental Status: She is alert and oriented to person, place, and time.     Sensory: Sensation is intact.     Motor: Motor function is intact.     Coordination: Coordination is intact.     Gait: Gait is intact.     Deep Tendon Reflexes: Reflexes are normal and symmetric.  Psychiatric:        Attention and Perception: Attention and perception normal.        Mood and Affect: Mood and affect normal.        Speech: Speech normal.        Behavior: Behavior normal. Behavior is cooperative.        Thought Content: Thought content normal.        Cognition and Memory: Cognition and memory normal.        Judgment: Judgment normal.     Results for orders placed or performed in visit on 03/04/22  CBC with Differential/Platelet  Result Value Ref Range   WBC 4.9 4.0 - 10.5 K/uL   RBC 4.05 3.87 - 5.11 MIL/uL   Hemoglobin 13.0 12.0 - 15.0 g/dL   HCT 38.9 36.0 - 46.0 %   MCV 96.0 80.0 - 100.0 fL   MCH 32.1 26.0 - 34.0 pg   MCHC 33.4 30.0 - 36.0 g/dL   RDW 12.6 11.5 - 15.5 %   Platelets 143 (L) 150 - 400 K/uL   nRBC 0.0 0.0 - 0.2 %   Neutrophils Relative % 61 %   Neutro Abs 3.0 1.7 - 7.7 K/uL   Lymphocytes Relative 23 %   Lymphs Abs 1.1 0.7 - 4.0 K/uL   Monocytes Relative 9 %   Monocytes Absolute 0.4 0.1 - 1.0 K/uL   Eosinophils Relative 6 %   Eosinophils Absolute 0.3 0.0 - 0.5 K/uL   Basophils Relative 1 %    Basophils Absolute 0.0 0.0 - 0.1 K/uL   Immature Granulocytes 0 %   Abs Immature Granulocytes 0.01 0.00 - 0.07 K/uL  Comprehensive metabolic panel  Result Value Ref Range   Sodium 138 135 - 145 mmol/L   Potassium 4.0 3.5 - 5.1 mmol/L   Chloride 104 98 - 111 mmol/L   CO2 28 22 - 32 mmol/L   Glucose, Bld 101 (H) 70 - 99 mg/dL   BUN 22 8 - 23 mg/dL   Creatinine,  Ser 0.63 0.44 - 1.00 mg/dL   Calcium 8.9 8.9 - 10.3 mg/dL   Total Protein 6.9 6.5 - 8.1 g/dL   Albumin 3.8 3.5 - 5.0 g/dL   AST 23 15 - 41 U/L   ALT 16 0 - 44 U/L   Alkaline Phosphatase 69 38 - 126 U/L   Total Bilirubin 1.0 0.3 - 1.2 mg/dL   GFR, Estimated >60 >60 mL/min   Anion gap 6 5 - 15  VITAMIN D 25 Hydroxy (Vit-D Deficiency, Fractures)  Result Value Ref Range   Vit D, 25-Hydroxy 47.31 30 - 100 ng/mL       Pertinent labs & imaging results that were available during my care of the patient were reviewed by me and considered in my medical decision making.  Assessment & Plan:  Anna Carney was seen today for establish care.  Diagnoses and all orders for this visit:  Anxiety Depression, major, single episode, moderate ([place]) Doing well on current regimen, will continue. No SI or HI.  -     escitalopram (LEXAPRO) 5 MG tablet; Take 1 tablet (5 mg total) by mouth daily. -     Thyroid Panel With TSH; Future -     CMP14+EGFR; Future  Essential hypertension BP fairly controlled. Changes were not made in regimen today. Goal BP is 130/80. Pt aware to report any persistent high or low readings. DASH diet and exercise encouraged. Exercise at least 150 minutes per week and increase as tolerated. Goal BMI > 25. Stress management encouraged. Avoid nicotine and tobacco product use. Avoid excessive alcohol and NSAID's. Avoid more than 2000 mg of sodium daily. Medications as prescribed. Follow up as scheduled.  -     losartan (COZAAR) 50 MG tablet; Take 1 tablet (50 mg total) by mouth daily. -     Thyroid Panel With TSH; Future -      CMP14+EGFR; Future -     Lipid panel; Future  Morbid obesity ([place]) Diet and exercise encouraged.  -     Thyroid Panel With TSH; Future -     CMP14+EGFR; Future -     Lipid panel; Future  Malignant neoplasm of lower-outer quadrant of left breast of female, estrogen receptor positive ([place]) History of radiation therapy Followed by Dr. Delton Coombes on a regular basis. Has not had post radiation echo, will order today.  -     ECHOCARDIOGRAM COMPLETE; Future     Continue all other maintenance medications.  Follow up plan: Return in about 6 months (around 03/11/2023), or if symptoms worsen or fail to improve, for CPE.   Continue healthy lifestyle choices, including diet (rich in fruits, vegetables, and lean proteins, and low in salt and simple carbohydrates) and exercise (at least 30 minutes of moderate physical activity daily).  Educational handout given for DASH diet, HTN  The above assessment and management plan was discussed with the patient. The patient verbalized understanding of and has agreed to the management plan. Patient is aware to call the clinic if they develop any new symptoms or if symptoms persist or worsen. Patient is aware when to return to the clinic for a follow-up visit. Patient educated on when it is appropriate to go to the emergency department.   Monia Pouch, FNP-C [facility] [phone]

## 2022-09-12 LAB — VITAMIN D 25 HYDROXY (VIT D DEFICIENCY, FRACTURES)

## 2022-09-17 ENCOUNTER — Inpatient Hospital Stay: Payer: BC Managed Care – PPO | Admitting: Hematology

## 2022-09-17 VITALS — BP 152/82 | HR 71 | Temp 98.3°F | Resp 18 | Ht 64.0 in | Wt 227.0 lb

## 2022-09-17 DIAGNOSIS — Z170 Estrogen receptor positive status [ER+]: Secondary | ICD-10-CM | POA: Diagnosis not present

## 2022-09-17 DIAGNOSIS — C50512 Malignant neoplasm of lower-outer quadrant of left female breast: Secondary | ICD-10-CM | POA: Diagnosis not present

## 2022-09-17 NOTE — Progress Notes (Signed)
[facility] [address], [place] [address]   Patient Care Team: Baruch Gouty, FNP as PCP - General (Family Medicine)  SUMMARY OF ONCOLOGIC HISTORY: Oncology History  Malignant neoplasm of lower-outer quadrant of left breast of female, estrogen receptor positive ([place])  10/27/2018 Initial Diagnosis   Malignant neoplasm of lower-outer quadrant of left breast of female, estrogen receptor positive ([place])   12/10/2018 Cancer Staging   Staging form: Breast, AJCC 8th Edition - Clinical stage from 12/10/2018: Stage IIIB (cT4b, cN0, cM0, G2, ER+, PR+, HER2-, Oncotype DX score: 2) - Signed by Derek Jack, MD on 12/10/2018     CURRENT THERAPY: Anastrozole '1mg'$  daily  CHIEF COMPLIANT: Follow up for left breast cancer  INTERVAL HISTORY: Anna Carney is a 72 y.o. female here today for follow up of her left breast cancer. Her last visit with me was on 03/12/22. Her next mammogram is due in July 2024.  Today, she states that she is doing well overall. She has been compliant with Anastrozole '1mg'$  daily. She reports that she has some hot flashes at night but these do not keep her up at night for the most part. She notes that she does feel fatigued most of the time. She wonders if this is related to her hypokalemia. Her potassium was 3.4 on 09/10/22. Her appetite level is at 50%. She denies any new pain but continues to have chronic bilateral knee pain which she rates as a 5/10 in severity today. She denies any leg swelling.  She is taking daily calcium and vitamin D supplementation. Her last bone density scan was on 02/09/2021.   REVIEW OF SYSTEMS:   Review of Systems  Constitutional:  Positive for fatigue. Negative for appetite change, chills and fever.  HENT:   Negative for lump/mass, mouth sores, nosebleeds, sore throat and trouble swallowing.   Eyes:  Negative for eye problems.  Respiratory:  Negative for cough.   Cardiovascular:  Negative for chest pain, leg  swelling and palpitations.  Gastrointestinal:  Negative for abdominal pain, constipation, diarrhea, nausea and vomiting.  Endocrine: Positive for hot flashes.  Genitourinary:  Negative for bladder incontinence, difficulty urinating, dysuria, frequency, hematuria and nocturia.   Musculoskeletal:  Positive for arthralgias (chronic, bilateral knees). Negative for back pain, flank pain, myalgias and neck pain.  Skin:  Negative for itching and rash.  Neurological:  Positive for headaches. Negative for dizziness and numbness.  Hematological:  Does not bruise/bleed easily.  Psychiatric/Behavioral:  Negative for depression, sleep disturbance and suicidal ideas. The patient is not nervous/anxious.   All other systems reviewed and are negative.   I have reviewed the past medical history, past surgical history, social history and family history with the patient and they are unchanged from previous note.   ALLERGIES:   is allergic to sulfa antibiotics.   MEDICATIONS:  Current Outpatient Medications  Medication Sig Dispense Refill   acetaminophen (TYLENOL) 325 MG tablet Take 650 mg by mouth every 6 (six) hours as needed.     anastrozole (ARIMIDEX) 1 MG tablet Take 1 tablet (1 mg total) by mouth daily. 30 tablet 3   Ascorbic Acid (VITAMIN C) 1000 MG tablet Take 2,000 mg by mouth daily.     calcium carbonate (OS-CAL) 1250 (500 Ca) MG chewable tablet Chew 1 tablet by mouth 2 (two) times daily.      cholecalciferol (VITAMIN D3) 25 MCG (1000 UT) tablet Take 2,000 Units by mouth daily.     Cyanocobalamin ([employer] B12  GUMMIES) [place] by mouth.     Docusate Calcium (STOOL SOFTENER PO) Take by mouth.     escitalopram (LEXAPRO) 5 MG tablet Take 1 tablet (5 mg total) by mouth daily. 90 tablet 1   losartan (COZAAR) 50 MG tablet Take 1 tablet (50 mg total) by mouth daily. 90 tablet 1   Multiple Vitamin (MULTIVITAMIN) tablet Take 1 tablet by mouth daily.     No current facility-administered medications  for this visit.     PHYSICAL EXAMINATION: Performance status (ECOG): 1 - Symptomatic but completely ambulatory  Vitals:   09/17/22 1446  BP: (!) 152/82  Pulse: 71  Resp: 18  Temp: 98.3 F (36.8 C)  SpO2: 96%   Wt Readings from Last 3 Encounters:  09/17/22 103 kg (227 lb)  09/10/22 103.6 kg (228 lb 6.4 oz)  03/12/22 100.2 kg (221 lb)   Physical Exam Vitals reviewed. Exam conducted with a chaperone present.  Constitutional:      Appearance: Normal appearance.  Cardiovascular:     Rate and Rhythm: Normal rate and regular rhythm.     Pulses: Normal pulses.     Heart sounds: Normal heart sounds.  Pulmonary:     Effort: Pulmonary effort is normal.     Breath sounds: Normal breath sounds.  Chest:  Breasts:    Right: Normal. No swelling, mass, nipple discharge, skin change or tenderness.     Left: Absent. No swelling, mass, skin change or tenderness.     Comments: Left mastectomy site is WNL Right breast has no palpable masses Abdominal:     Palpations: Abdomen is soft. There is no hepatomegaly, splenomegaly or mass.     Tenderness: There is no abdominal tenderness.  Lymphadenopathy:     Cervical:     Right cervical: No superficial, deep or posterior cervical adenopathy.    Left cervical: No superficial, deep or posterior cervical adenopathy.     Upper Body:     Right upper body: No supraclavicular, axillary or pectoral adenopathy.     Left upper body: No supraclavicular, axillary or pectoral adenopathy.  Neurological:     General: No focal deficit present.     Mental Status: She is alert and oriented to person, place, and time.  Psychiatric:        Mood and Affect: Mood normal.        Behavior: Behavior normal.     Breast Exam Chaperone: Thana Ates     LABORATORY DATA:  I have reviewed the data as listed    Latest Ref Rng & Units 09/10/2022    1:54 PM 03/04/2022   10:49 AM 08/28/2021    2:01 PM  CMP  Glucose 70 - 99 mg/dL 109  101  117   BUN 8 - 23 mg/dL  '17  22  17   '$ Creatinine 0.44 - 1.00 mg/dL 0.77  0.63  0.82   Sodium 135 - 145 mmol/L 136  138  141   Potassium 3.5 - 5.1 mmol/L 3.4  4.0  4.0   Chloride 98 - 111 mmol/L 101  104  106   CO2 22 - 32 mmol/L '26  28  27   '$ Calcium 8.9 - 10.3 mg/dL 9.0  8.9  9.5   Total Protein 6.5 - 8.1 g/dL 7.4  6.9  7.2   Total Bilirubin 0.3 - 1.2 mg/dL 0.7  1.0  0.5   Alkaline Phos 38 - 126 U/L 72  69  95   AST 15 -  41 U/L '26  23  28   '$ ALT 0 - 44 U/L '17  16  20    '$ Lab Results  Component Value Date   CAN153 22.0 08/28/2021   CAN153 21.2 02/09/2021   CAN153 21.9 06/13/2020   Lab Results  Component Value Date   WBC 6.8 09/10/2022   HGB 13.7 09/10/2022   HCT 41.3 09/10/2022   MCV 94.9 09/10/2022   PLT 163 09/10/2022   NEUTROABS 4.2 09/10/2022    ASSESSMENT:  1.  Stage IIIb (T4N0) left breast IDC, ER/PR positive, HER-2 negative: -Biopsy of the left breast on 10/20/2018 showed IDC.  Left axillary core biopsy was negative. -Left mastectomy on 11/12/2018, 11 cm IDC, grade 2, margins negative, 0/14 lymph node positive, ER/PR positive, HER-2 2+ by IHC and negative by FISH, Ki-67 10%. -Oncotype DX recurrence score of 2. -Anastrozole started on 12/10/2018. -XRT from 02/01/2019-03/18/2019. -Right breast mammogram on 12/16/2019 shows BI-RADS 1.   2.  Osteopenia: -DEXA scan on 02/09/2019 shows T score of -2. 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   PLAN:  1.  Stage IIIb (T4N0) left breast IDC, ER/PR positive, HER-2 negative: - Physical examination shows left mastectomy site is within normal limits.  Right breast has no palpable masses or adenopathy. - She is tolerating anastrozole very well. - Labs today shows normal chemistries and CBC. - RTC 6 months for follow-up.  Will schedule right breast mammogram in July.   2.  Osteopenia: - DEXA scan on 02/09/2021 with T-score -2.1. - Continue calcium and vitamin D supplements.  Vitamin D level is normal. - Will schedule for another DEXA scan prior to next  visit.  Breast Cancer therapy associated bone loss: I have recommended calcium, Vitamin D and weight bearing exercises.  Orders placed this encounter:  No orders of the defined types were placed in this encounter.   The patient has a good understanding of the overall plan. She agrees with it. She will call with any problems that may develop before the next visit here.   I,Alexis Herring,acting as a Education administrator for [employer], MD.,have documented all relevant documentation on the behalf of Derek Jack, MD,as directed by  Derek Jack, MD while in the presence of Derek Jack, MD.  I, Derek Jack MD, have reviewed the above documentation for accuracy and completeness, and I agree with the above.   Derek Jack, MD [place] [phone]

## 2022-09-17 NOTE — Patient Instructions (Addendum)
[facility]  Discharge Instructions  You were seen and examined today by Dr. Delton Coombes.  Dr. Delton Coombes discussed your most recent lab work which revealed that everything looks good and stable.  Dr. Delton Coombes is ordering mammogram and bone density to be done in 02/2023.  Follow-up as scheduled in 6 months with labs.     Thank you for choosing [facility] to provide your oncology and hematology care.   To afford each patient quality time with our provider, please arrive at least 15 minutes before your scheduled appointment time. You may need to reschedule your appointment if you arrive late (10 or more minutes). Arriving late affects you and other patients whose appointments are after yours.  Also, if you miss three or more appointments without notifying the office, you may be dismissed from the clinic at the provider's discretion.    Again, thank you for choosing [facility].  Our hope is that these requests will decrease the amount of time that you wait before being seen by our physicians.   If you have a lab appointment with the [facility] please come in thru the Main Entrance and check in at the main information desk.           _____________________________________________________________  Should you have questions after your visit to [facility], please contact our office at [phone] and follow the prompts.  Our office hours are 8:00 a.m. to 4:30 p.m. Monday - Thursday and 8:00 a.m. to 2:30 p.m. Friday.  Please note that voicemails left after 4:00 p.m. may not be returned until the following business day.  We are closed weekends and all major holidays.  You do have access to a nurse 24-7, just call the main number to the clinic [phone] and do not press any options, hold on the line and a nurse will answer the phone.    For prescription refill requests, have your pharmacy contact our office  and allow 72 hours.    Masks are optional in the cancer centers. If you would like for your care team to wear a mask while they are taking care of you, please let them know. You may have one support person who is at least 70 years old accompany you for your appointments.

## 2022-09-26 ENCOUNTER — Other Ambulatory Visit: Payer: Self-pay | Admitting: *Deleted

## 2022-09-26 NOTE — Telephone Encounter (Signed)
Anastrozole refill approved.  Patient is tolerating and is to continue therapy.  

## 2022-11-27 ENCOUNTER — Ambulatory Visit (HOSPITAL_COMMUNITY): Payer: BC Managed Care – PPO

## 2023-01-27 ENCOUNTER — Ambulatory Visit (HOSPITAL_COMMUNITY)
Admission: RE | Admit: 2023-01-27 | Discharge: 2023-01-27 | Disposition: A | Discharge status: Home / Self Care | Payer: BC Managed Care – PPO | Source: Ambulatory Visit | Attending: Family Medicine | Admitting: Family Medicine

## 2023-01-27 DIAGNOSIS — Z170 Estrogen receptor positive status [ER+]: Secondary | ICD-10-CM | POA: Diagnosis not present

## 2023-01-27 DIAGNOSIS — Z923 Personal history of irradiation: Secondary | ICD-10-CM

## 2023-01-27 DIAGNOSIS — C50512 Malignant neoplasm of lower-outer quadrant of left female breast: Secondary | ICD-10-CM | POA: Diagnosis not present

## 2023-01-27 DIAGNOSIS — Z0189 Encounter for other specified special examinations: Secondary | ICD-10-CM

## 2023-01-27 LAB — ECHOCARDIOGRAM COMPLETE
AR max vel: 2.18 cm2
AV Area VTI: 2.36 cm2
AV Area mean vel: 2.07 cm2
AV Mean grad: 4.3 mmHg
AV Peak grad: 8.1 mmHg
Ao pk vel: 1.42 m/s
Area-P 1/2: 3.21 cm2
S' Lateral: 2.9 cm

## 2023-01-27 NOTE — Progress Notes (Signed)
*  PRELIMINARY RESULTS* Echocardiogram 2D Echocardiogram has been performed.  Stacey Drain 01/27/2023, 3:08 PM

## 2023-03-10 ENCOUNTER — Ambulatory Visit (HOSPITAL_COMMUNITY)
Admission: RE | Admit: 2023-03-10 | Discharge: 2023-03-10 | Disposition: A | Discharge status: Home / Self Care | Payer: Medicare Other | Source: Ambulatory Visit | Attending: Hematology | Admitting: Hematology

## 2023-03-10 ENCOUNTER — Inpatient Hospital Stay: Payer: Medicare Other | Attending: Hematology

## 2023-03-10 DIAGNOSIS — Z79811 Long term (current) use of aromatase inhibitors: Secondary | ICD-10-CM | POA: Diagnosis not present

## 2023-03-10 DIAGNOSIS — M810 Age-related osteoporosis without current pathological fracture: Secondary | ICD-10-CM | POA: Insufficient documentation

## 2023-03-10 DIAGNOSIS — Z170 Estrogen receptor positive status [ER+]: Secondary | ICD-10-CM

## 2023-03-10 DIAGNOSIS — C50512 Malignant neoplasm of lower-outer quadrant of left female breast: Secondary | ICD-10-CM | POA: Insufficient documentation

## 2023-03-10 DIAGNOSIS — Z853 Personal history of malignant neoplasm of breast: Secondary | ICD-10-CM | POA: Insufficient documentation

## 2023-03-10 DIAGNOSIS — Z780 Asymptomatic menopausal state: Secondary | ICD-10-CM | POA: Diagnosis not present

## 2023-03-10 DIAGNOSIS — Z1231 Encounter for screening mammogram for malignant neoplasm of breast: Secondary | ICD-10-CM | POA: Diagnosis not present

## 2023-03-10 DIAGNOSIS — Z13820 Encounter for screening for osteoporosis: Secondary | ICD-10-CM | POA: Diagnosis not present

## 2023-03-10 LAB — CBC WITH DIFFERENTIAL/PLATELET
Abs Immature Granulocytes: 0.01 10*3/uL (ref 0.00–0.07)
Basophils Absolute: 0.1 10*3/uL (ref 0.0–0.1)
Basophils Relative: 1 %
Eosinophils Absolute: 0.2 10*3/uL (ref 0.0–0.5)
Eosinophils Relative: 3 %
HCT: 44 % (ref 36.0–46.0)
Hemoglobin: 14.3 g/dL (ref 12.0–15.0)
Immature Granulocytes: 0 %
Lymphocytes Relative: 24 %
Lymphs Abs: 1.5 10*3/uL (ref 0.7–4.0)
MCH: 31.3 pg (ref 26.0–34.0)
MCHC: 32.5 g/dL (ref 30.0–36.0)
MCV: 96.3 fL (ref 80.0–100.0)
Monocytes Absolute: 0.5 10*3/uL (ref 0.1–1.0)
Monocytes Relative: 8 %
Neutro Abs: 3.8 10*3/uL (ref 1.7–7.7)
Neutrophils Relative %: 64 %
Platelets: 168 10*3/uL (ref 150–400)
RBC: 4.57 MIL/uL (ref 3.87–5.11)
RDW: 12.3 % (ref 11.5–15.5)
WBC: 6 10*3/uL (ref 4.0–10.5)
nRBC: 0 % (ref 0.0–0.2)

## 2023-03-10 LAB — COMPREHENSIVE METABOLIC PANEL
ALT: 21 U/L (ref 0–44)
AST: 26 U/L (ref 15–41)
Albumin: 3.9 g/dL (ref 3.5–5.0)
Alkaline Phosphatase: 72 U/L (ref 38–126)
Anion gap: 8 (ref 5–15)
BUN: 17 mg/dL (ref 8–23)
CO2: 27 mmol/L (ref 22–32)
Calcium: 9.5 mg/dL (ref 8.9–10.3)
Chloride: 102 mmol/L (ref 98–111)
Creatinine, Ser: 0.76 mg/dL (ref 0.44–1.00)
GFR, Estimated: >60 mL/min (ref >60)
Glucose, Bld: 98 mg/dL (ref 70–99)
Potassium: 4.2 mmol/L (ref 3.5–5.1)
Sodium: 137 mmol/L (ref 135–145)
Total Bilirubin: 1 mg/dL (ref 0.3–1.2)
Total Protein: 7.6 g/dL (ref 6.5–8.1)

## 2023-03-10 LAB — IRON AND TIBC
Iron: 99 ug/dL (ref 28–170)
Saturation Ratios: 30 % (ref 10.4–31.8)
TIBC: 334 ug/dL (ref 250–450)
UIBC: 235 ug/dL

## 2023-03-10 LAB — FERRITIN: Ferritin: 162 ng/mL (ref 11–307)

## 2023-03-11 ENCOUNTER — Encounter: Payer: Self-pay | Admitting: Family Medicine

## 2023-03-11 ENCOUNTER — Ambulatory Visit (INDEPENDENT_AMBULATORY_CARE_PROVIDER_SITE_OTHER): Payer: Medicare Other | Admitting: Family Medicine

## 2023-03-11 VITALS — BP 129/73 | HR 89 | Temp 97.2°F | Ht 64.0 in | Wt 236.8 lb

## 2023-03-11 DIAGNOSIS — R5381 Other malaise: Secondary | ICD-10-CM | POA: Diagnosis not present

## 2023-03-11 DIAGNOSIS — Z170 Estrogen receptor positive status [ER+]: Secondary | ICD-10-CM

## 2023-03-11 DIAGNOSIS — E559 Vitamin D deficiency, unspecified: Secondary | ICD-10-CM | POA: Insufficient documentation

## 2023-03-11 DIAGNOSIS — F419 Anxiety disorder, unspecified: Secondary | ICD-10-CM

## 2023-03-11 DIAGNOSIS — C50512 Malignant neoplasm of lower-outer quadrant of left female breast: Secondary | ICD-10-CM | POA: Diagnosis not present

## 2023-03-11 DIAGNOSIS — R5383 Other fatigue: Secondary | ICD-10-CM

## 2023-03-11 DIAGNOSIS — I10 Essential (primary) hypertension: Secondary | ICD-10-CM | POA: Diagnosis not present

## 2023-03-11 DIAGNOSIS — F320 Major depressive disorder, single episode, mild: Secondary | ICD-10-CM

## 2023-03-11 MED ORDER — ESCITALOPRAM OXALATE 10 MG PO TABS: 10.0000 mg | ORAL_TABLET | Freq: Every day | ORAL | 1 refills | Status: DC

## 2023-03-11 NOTE — Patient Instructions (Addendum)
If your symptoms worsen or you have thoughts of suicide/homicide, PLEASE SEEK IMMEDIATE MEDICAL ATTENTION.  You may always call the National Suicide Hotline.  This is available 24 hours a day, 7 days a week.  Their number is: 1-800-273-8255  Taking the medicine as directed and not missing any doses is one of the best things you can do to treat your depression.  Here are some things to keep in mind:  Side effects (stomach upset, some increased anxiety) may happen before you notice a benefit.  These side effects typically go away over time. Changes to your dose of medicine or a change in medication all together is sometimes necessary Most people need to be on medication at least 12 months Many people will notice an improvement within two weeks but the full effect of the medication can take up to 4-6 weeks Stopping the medication when you start feeling better often results in a return of symptoms Never discontinue your medication without contacting a health care professional first.  Some medications require gradual discontinuation/ taper and can make you sick if you stop them abruptly.  If your symptoms worsen or you have thoughts of suicide/homicide, PLEASE SEEK IMMEDIATE MEDICAL ATTENTION.  You may always call:  National Suicide Hotline: 800-273-8255 [place] Crisis Line: 336-832-9700 Crisis Recovery in [place]: 800-939-5911  These are available 24 hours a day, 7 days a week.   Goal BP:  For patients younger than 60: Goal BP < 140/90. For patients 60 and older: Goal BP < 150/90. For patients with diabetes: Goal BP < 140/90.  Take your medications faithfully as prescribed. Maintain a healthy weight. Get at least 150 minutes of aerobic exercise per week. Minimize salt intake, less than 2000 mg per day. Minimize alcohol intake.  DASH Eating Plan DASH stands for "Dietary Approaches to Stop Hypertension." The DASH eating plan is a healthy eating plan that has been shown to  reduce high blood pressure (hypertension). Additional health benefits may include reducing the risk of type 2 diabetes mellitus, heart disease, and stroke. The DASH eating plan may also help with weight loss.  WHAT DO I NEED TO KNOW ABOUT THE DASH EATING PLAN? For the DASH eating plan, you will follow these general guidelines: Choose foods with a percent daily value for sodium of less than 5% (as listed on the food label). Use salt-free seasonings or herbs instead of table salt or sea salt. Check with your health care provider or pharmacist before using salt substitutes. Eat lower-sodium products, often labeled as "lower sodium" or "no salt added." Eat fresh foods. Eat more vegetables, fruits, and low-fat dairy products. Choose whole grains. Look for the word "whole" as the first word in the ingredient list. Choose fish and skinless chicken or turkey more often than red meat. Limit fish, poultry, and meat to 6 oz (170 g) each day. Limit sweets, desserts, sugars, and sugary drinks. Choose heart-healthy fats. Limit cheese to 1 oz (28 g) per day. Eat more home-cooked food and less restaurant, buffet, and fast food. Limit fried foods. Cook foods using methods other than frying. Limit canned vegetables. If you do use them, rinse them well to decrease the sodium. When eating at a restaurant, ask that your food be prepared with less salt, or no salt if possible.  WHAT FOODS CAN I EAT? Seek help from a dietitian for individual calorie needs.  Grains Whole grain or whole wheat bread. Brown rice. Whole grain or whole wheat pasta. Quinoa, bulgur, and whole   grain cereals. Low-sodium cereals. Corn or whole wheat flour tortillas. Whole grain cornbread. Whole grain crackers. Low-sodium crackers.  Vegetables Fresh or frozen vegetables (raw, steamed, roasted, or grilled). Low-sodium or reduced-sodium tomato and vegetable juices. Low-sodium or reduced-sodium tomato sauce and paste. Low-sodium or  reduced-sodium canned vegetables.   Fruits All fresh, canned (in natural juice), or frozen fruits.  Meat and Other Protein Products Ground beef (85% or leaner), grass-fed beef, or beef trimmed of fat. Skinless chicken or turkey. Ground chicken or turkey. Pork trimmed of fat. All fish and seafood. Eggs. Dried beans, peas, or lentils. Unsalted nuts and seeds. Unsalted canned beans.  Dairy Low-fat dairy products, such as skim or 1% milk, 2% or reduced-fat cheeses, low-fat ricotta or cottage cheese, or plain low-fat yogurt. Low-sodium or reduced-sodium cheeses.  Fats and Oils Tub margarines without trans fats. Light or reduced-fat mayonnaise and salad dressings (reduced sodium). Avocado. Safflower, olive, or canola oils. Natural peanut or almond butter.  Other Unsalted popcorn and pretzels. The items listed above may not be a complete list of recommended foods or beverages. Contact your dietitian for more options.  WHAT FOODS ARE NOT RECOMMENDED?  Grains White bread. White pasta. White rice. Refined cornbread. Bagels and croissants. Crackers that contain trans fat.  Vegetables Creamed or fried vegetables. Vegetables in a cheese sauce. Regular canned vegetables. Regular canned tomato sauce and paste. Regular tomato and vegetable juices.  Fruits Dried fruits. Canned fruit in light or heavy syrup. Fruit juice.  Meat and Other Protein Products Fatty cuts of meat. Ribs, chicken wings, bacon, sausage, bologna, salami, chitterlings, fatback, hot dogs, bratwurst, and packaged luncheon meats. Salted nuts and seeds. Canned beans with salt.  Dairy Whole or 2% milk, cream, half-and-half, and cream cheese. Whole-fat or sweetened yogurt. Full-fat cheeses or blue cheese. Nondairy creamers and whipped toppings. Processed cheese, cheese spreads, or cheese curds.  Condiments Onion and garlic salt, seasoned salt, table salt, and sea salt. Canned and packaged gravies. Worcestershire sauce. Tartar sauce.  Barbecue sauce. Teriyaki sauce. Soy sauce, including reduced sodium. Steak sauce. Fish sauce. Oyster sauce. Cocktail sauce. Horseradish. Ketchup and mustard. Meat flavorings and tenderizers. Bouillon cubes. Hot sauce. Tabasco sauce. Marinades. Taco seasonings. Relishes.  Fats and Oils Butter, stick margarine, lard, shortening, ghee, and bacon fat. Coconut, palm kernel, or palm oils. Regular salad dressings.  Other Pickles and olives. Salted popcorn and pretzels.  The items listed above may not be a complete list of foods and beverages to avoid. Contact your dietitian for more information.  WHERE CAN I FIND MORE INFORMATION? National Heart, Lung, and Blood Institute: www.nhlbi.nih.gov/health/health-topics/topics/dash/ Document Released: 07/25/2011 Document Revised: 12/20/2013 Document Reviewed: 06/09/2013 ExitCare Patient Information 2015 [employer]. This information is not intended to replace advice given to you by your health care provider. Make sure you discuss any questions you have with your health care provider.   I think that you would greatly benefit from seeing a nutritionist.  If you are interested, please call Dr. Sykes at [phone] to schedule an appointment.    

## 2023-03-11 NOTE — Progress Notes (Addendum)
Subjective:  Patient ID: Anna Carney, female    DOB: 02-16-1951, 72 y.o.   MRN: [id number]  Patient Care Team: Sonny Masters, FNP as PCP - General (Family Medicine)   Chief Complaint:  Annual Exam and Depression (Worse that last few months )   HPI: Anna Carney is a 72 y.o. female presenting on 03/11/2023 for Annual Exam and Depression (Worse that last few months )   1. Essential hypertension Complaint with meds - Yes Current Medications - cozaar Checking BP at home ranging 120-130 systolic Exercising Regularly - Yes Watching Salt intake - No Pertinent ROS:  Headache - No Fatigue - Yes Visual Disturbances - No Chest pain - No Dyspnea - No Palpitations - No LE edema - No They report good compliance with medications and can restate their regimen by memory. No medication side effects.  BP Readings from Last 3 Encounters:  03/11/23 129/73  09/17/22 (!) 152/82  09/10/22 135/77     2. Morbid obesity (HCC) Pt reports weight gain of 8 pounds. Pt states that she eats even when she is not Anna Carney. Pt reports difficulty ambulating and pain in her knees with weight gain. Reports decreased energy and inability to maintain any dietary restrictions.   3. Malignant neoplasm of lower-outer quadrant of left breast of female, estrogen receptor positive (HCC) Being followed by Dr Ellin Saba oncology takes Anastrozole 1 mg PO daily as prescribed.  4. Depression, major, single episode, mild (HCC) Pt reports a 8 lb weight gain and eats when not hungry. Taking Lexapro as prescribed Pt states that it isnt working. Pt aslo reports not having energy to do projects around the house. Denies SI/HI    03/11/2023    2:07 PM 09/10/2022    9:20 AM 03/05/2022    8:07 AM 09/05/2021    9:04 AM 03/05/2021    8:41 AM  Depression screen PHQ 2/9  Decreased Interest 2 0 0 0 0  Down, Depressed, Hopeless 0 0 0 0 0  PHQ - 2 Score 2 0 0 0 0  Altered sleeping 0 0 2 2 3   Tired, decreased energy 2 2 1 2  1   Change in appetite 2 2 0 0 0  Feeling bad or failure about yourself  0 0 0 0 0  Trouble concentrating 0 0 0 0 0  Moving slowly or fidgety/restless 0 0 0 0 0  Suicidal thoughts 0 0 0 0 0  PHQ-9 Score 6 4 3 4 4   Difficult doing work/chores Not difficult at all Not difficult at all Not difficult at all Not difficult at all Not difficult at all    5. Anxiety Denies any anxiety, chest palpitations, excesive sweating, dizziness lightheadedness. Admits to difficulty getting back to sleep because her mind "runs away" in middle of night    03/11/2023    2:07 PM 09/10/2022    9:21 AM 03/05/2022    8:07 AM 09/05/2021    9:04 AM  GAD 7 : Generalized Anxiety Score  Nervous, Anxious, on Edge 0 0 0 0  Control/stop worrying 0 0 0 0  Worry too much - different things 0 0 0 0  Trouble relaxing 2 0 0 0  Restless 0 0 0 0  Easily annoyed or irritable 0 0 0 0  Afraid - awful might happen 0 0 0 0  Total GAD 7 Score 2 0 0 0  Anxiety Difficulty Not difficult at all Not difficult at all Not difficult at all Not difficult  at all     6. Vitamin D deficiency Pt is taking oral repletion therapy. Denies bone pain and tenderness, muscle weakness, fracture, and difficulty walking. Lab Results  Component Value Date   VD25OH See Scanned report in [facility] 09/10/2022   VD25OH 47.31 03/04/2022   VD25OH 61.92 08/28/2021   Lab Results  Component Value Date   CALCIUM 9.5 03/10/2023      Relevant past medical, surgical, family, and social history reviewed and updated as indicated.  Allergies and medications reviewed and updated. Data reviewed: Chart in Epic.   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   Hypertension    Shingles     Past Surgical History:  Procedure Laterality Date   CHOLECYSTECTOMY     MASTECTOMY W/ SENTINEL NODE BIOPSY Left 11/12/2018   Procedure: LEFT MASTECTOMY WITH LEFT AXILLARY SENTINEL LYMPH NODE BIOPSY;   Surgeon: Emelia Loron, MD;  Location: [facility];  Service: General;  Laterality: Left;    Social History   Socioeconomic History   Marital status: Single    Spouse name: Not on file   Number of children: Not on file   Years of education: Not on file   Highest education level: Not on file  Occupational History   Occupation: [employer]    Comment: in [employer]   Occupation: [employer]    Comment: cafeteria and bus driver  Tobacco Use   Smoking status: Former    Current packs/day: 0.00    Types: Cigarettes    Quit date: 1994    Years since quitting: 30.5   Smokeless tobacco: Never  Vaping Use   Vaping status: Never Used  Substance and Sexual Activity   Alcohol use: Yes    Comment: rarely   Drug use: No   Sexual activity: Not Currently    Birth control/protection: Post-menopausal  Other Topics Concern   Not on file  Social History Narrative   Not on file   Social Determinants of Health   Financial Resource Strain: Low Risk  (06/20/2020)   Overall Financial Resource Strain (CARDIA)    Difficulty of Paying Living Expenses: Not hard at all  Food Insecurity: No Food Insecurity (06/20/2020)   Hunger Vital Sign    Worried About Running Out of Food in the Last Year: Never true    Ran Out of Food in the Last Year: Never true  Transportation Needs: No Transportation Needs (06/20/2020)   PRAPARE - Administrator, Civil Service (Medical): No    Lack of Transportation (Non-Medical): No  Physical Activity: Inactive (06/20/2020)   Exercise Vital Sign    Days of Exercise per Week: 0 days    Minutes of Exercise per Session: 0 min  Stress: Stress Concern Present (06/20/2020)   [employer] of Occupational Health - Occupational Stress Questionnaire    Feeling of Stress : To some extent  Social Connections: Socially Isolated (06/20/2020)   Social Connection and Isolation Panel [NHANES]    Frequency of Communication with  Friends and Family: More than three times a week    Frequency of Social Gatherings with Friends and Family: More than three times a week    Attends Religious Services: Never    Database administrator or Organizations: No    Attends Banker Meetings: Never    Marital Status: Never married  Catering manager  Violence: Not At Risk (06/20/2020)   Humiliation, Afraid, Rape, and Kick questionnaire    Fear of Current or Ex-Partner: No    Emotionally Abused: No    Physically Abused: No    Sexually Abused: No    Outpatient Encounter Medications as of 03/11/2023  Medication Sig   acetaminophen (TYLENOL) 325 MG tablet Take 650 mg by mouth every 6 (six) hours as needed.   anastrozole (ARIMIDEX) 1 MG tablet Take 1 tablet by mouth once daily   Ascorbic Acid (VITAMIN C) 1000 MG tablet Take 2,000 mg by mouth daily.   calcium carbonate (OS-CAL) 1250 (500 Ca) MG chewable tablet Chew 1 tablet by mouth 2 (two) times daily.    cholecalciferol (VITAMIN D3) 25 MCG (1000 UT) tablet Take 2,000 Units by mouth daily.   Cyanocobalamin ([employer] B12 GUMMIES) 500 MCG CHEW Chew by mouth.   Docusate Calcium (STOOL SOFTENER PO) Take by mouth.   escitalopram (LEXAPRO) 10 MG tablet Take 1 tablet (10 mg total) by mouth daily.   losartan (COZAAR) 50 MG tablet Take 1 tablet (50 mg total) by mouth daily.   Multiple Vitamin (MULTIVITAMIN) tablet Take 1 tablet by mouth daily.   [DISCONTINUED] escitalopram (LEXAPRO) 5 MG tablet Take 1 tablet (5 mg total) by mouth daily.   No facility-administered encounter medications on file as of 03/11/2023.    Allergies  Allergen Reactions   Sulfa Antibiotics Nausea Only    Review of Systems  Constitutional:  Positive for activity change and fatigue. Negative for appetite change, chills, diaphoresis, fever and unexpected weight change.  HENT: Negative.    Eyes:  Negative for photophobia and visual disturbance.  Respiratory:  Negative for chest tightness, shortness of breath and  wheezing.   Cardiovascular:  Negative for chest pain, palpitations and leg swelling.  Gastrointestinal:  Negative for abdominal pain.       Pt states she eats even when not humgary  Endocrine: Negative for polydipsia, polyphagia and polyuria.  Genitourinary:  Negative for decreased urine volume and difficulty urinating.  Musculoskeletal: Negative.  Negative for back pain.       R knee >L knee burning ache  Neurological:  Negative for dizziness, weakness, light-headedness and headaches.  Psychiatric/Behavioral:  Positive for depression and sleep disturbance. Negative for agitation, behavioral problems, confusion, decreased concentration, dysphoric mood, hallucinations, self-injury and suicidal ideas. The patient is nervous/anxious. The patient is not hyperactive.   All other systems reviewed and are negative.       Objective:  BP 129/73   Pulse 89   Temp (!) 97.2 F (36.2 C) (Temporal)   Ht 5\' 4"  (1.626 m)   Wt 236 lb 12.8 oz (107.4 kg)   SpO2 94%   BMI 40.65 kg/m    Wt Readings from Last 3 Encounters:  03/11/23 236 lb 12.8 oz (107.4 kg)  09/17/22 227 lb (103 kg)  09/10/22 228 lb 6.4 oz (103.6 kg)    Physical Exam Vitals and nursing note reviewed.  Constitutional:      General: She is not in acute distress.    Appearance: Normal appearance. She is well-developed and well-groomed. She is obese. She is not ill-appearing, toxic-appearing or diaphoretic.  HENT:     Head: Normocephalic and atraumatic.     Jaw: There is normal jaw occlusion.     Right Ear: Hearing and tympanic membrane normal.     Left Ear: Hearing and tympanic membrane normal.     Nose: Nose normal.     Mouth/Throat:  Lips: Pink.     Mouth: Mucous membranes are moist.     Pharynx: Oropharynx is clear. Uvula midline.  Eyes:     General: Lids are normal.     Extraocular Movements: Extraocular movements intact.     Conjunctiva/sclera: Conjunctivae normal.     Pupils: Pupils are equal, round, and reactive  to light.  Neck:     Thyroid: No thyroid mass, thyromegaly or thyroid tenderness.     Vascular: No carotid bruit or JVD.     Trachea: Trachea and phonation normal.  Cardiovascular:     Rate and Rhythm: Normal rate and regular rhythm.     Chest Wall: PMI is not displaced.     Pulses: Normal pulses.     Heart sounds: Normal heart sounds. No murmur heard.    No friction rub. No gallop.  Pulmonary:     Effort: Pulmonary effort is normal. No respiratory distress.     Breath sounds: Normal breath sounds. No wheezing.  Abdominal:     General: Abdomen is flat. Bowel sounds are normal. There is no distension or abdominal bruit.     Palpations: Abdomen is soft. There is no hepatomegaly or splenomegaly.     Tenderness: There is no abdominal tenderness. There is no right CVA tenderness or left CVA tenderness.     Hernia: No hernia is present.  Genitourinary:    General: Normal vulva.  Musculoskeletal:        General: Normal range of motion.     Cervical back: Normal range of motion and neck supple.     Right lower leg: No edema.     Left lower leg: No edema.  Lymphadenopathy:     Cervical: No cervical adenopathy.  Skin:    General: Skin is warm and dry.     Capillary Refill: Capillary refill takes less than 2 seconds.     Coloration: Skin is not cyanotic, jaundiced or pale.     Findings: No rash.  Neurological:     General: No focal deficit present.     Mental Status: She is alert and oriented to person, place, and time.     Sensory: Sensation is intact.     Motor: Motor function is intact.     Coordination: Coordination is intact.     Gait: Gait is intact.     Deep Tendon Reflexes: Reflexes are normal and symmetric.  Psychiatric:        Attention and Perception: Attention and perception normal.        Mood and Affect: Mood and affect normal.        Speech: Speech normal.        Behavior: Behavior normal. Behavior is cooperative.        Thought Content: Thought content normal.         Cognition and Memory: Cognition and memory normal.        Judgment: Judgment normal.     Results for orders placed or performed in visit on 03/10/23  CBC with Differential/Platelet  Result Value Ref Range   WBC 6.0 4.0 - 10.5 K/uL   RBC 4.57 3.87 - 5.11 MIL/uL   Hemoglobin 14.3 12.0 - 15.0 g/dL   HCT 65.7 84.6 - 96.2 %   MCV 96.3 80.0 - 100.0 fL   MCH 31.3 26.0 - 34.0 pg   MCHC 32.5 30.0 - 36.0 g/dL   RDW 95.2 84.1 - 32.4 %   Platelets 168 150 - 400 K/uL   nRBC  0.0 0.0 - 0.2 %   Neutrophils Relative % 64 %   Neutro Abs 3.8 1.7 - 7.7 K/uL   Lymphocytes Relative 24 %   Lymphs Abs 1.5 0.7 - 4.0 K/uL   Monocytes Relative 8 %   Monocytes Absolute 0.5 0.1 - 1.0 K/uL   Eosinophils Relative 3 %   Eosinophils Absolute 0.2 0.0 - 0.5 K/uL   Basophils Relative 1 %   Basophils Absolute 0.1 0.0 - 0.1 K/uL   Immature Granulocytes 0 %   Abs Immature Granulocytes 0.01 0.00 - 0.07 K/uL  Comprehensive metabolic panel  Result Value Ref Range   Sodium 137 135 - 145 mmol/L   Potassium 4.2 3.5 - 5.1 mmol/L   Chloride 102 98 - 111 mmol/L   CO2 27 22 - 32 mmol/L   Glucose, Bld 98 70 - 99 mg/dL   BUN 17 8 - 23 mg/dL   Creatinine, Ser 4.09 0.44 - 1.00 mg/dL   Calcium 9.5 8.9 - 81.1 mg/dL   Total Protein 7.6 6.5 - 8.1 g/dL   Albumin 3.9 3.5 - 5.0 g/dL   AST 26 15 - 41 U/L   ALT 21 0 - 44 U/L   Alkaline Phosphatase 72 38 - 126 U/L   Total Bilirubin 1.0 0.3 - 1.2 mg/dL   GFR, Estimated >91 >47 mL/min   Anion gap 8 5 - 15  Ferritin  Result Value Ref Range   Ferritin 162 11 - 307 ng/mL  Iron and TIBC  Result Value Ref Range   Iron 99 28 - 170 ug/dL   TIBC 829 562 - 130 ug/dL   Saturation Ratios 30 10.4 - 31.8 %   UIBC 235 ug/dL       Pertinent labs & imaging results that were available during my care of the patient were reviewed by me and considered in my medical decision making.  Assessment & Plan:  Anna Carney was seen today for annual exam and depression.  Diagnoses and all orders  for this visit:  Essential hypertension -     Thyroid Panel With TSH -     Lipid panel BP is well controlled. Changes not made in regimen today. Goal BP is 130/80. Pt aware to report any persistent high or low readings. DASH diet and exercise encouraged. Exercise at least 150 minutes per week and increase as tolerated. Goal BMI > 25. Stress management encouraged. Avoid nicotine and tobacco product use. Avoid excessive alcohol and NSAID's. Avoid more than 2000 mg of sodium daily. Medications as prescribed. Follow up as scheduled.    Morbid obesity (HCC) -     Thyroid Panel With TSH -     Lipid panel -     VITAMIN D 25 Hydroxy (Vit-D Deficiency, Fractures) Confirmed weight gain of 8 lbs from previous visit.  Filed Weights   03/11/23 1405  Weight: 236 lb 12.8 oz (107.4 kg)   The patient is asked to make an attempt to improve diet and exercise patterns to aid in medical management of this problem.   Malignant neoplasm of lower-outer quadrant of left breast of female, estrogen receptor positive (HCC) Being followed by Dr Ellin Saba oncology  continue all prescriptions and medications as prescribed. Keep all appointments as scheduled.  Depression, major, single episode, mild (HCC) -     escitalopram (LEXAPRO) 10 MG tablet; Take 1 tablet (10 mg total) by mouth daily. -     Thyroid Panel With TSH Pt educated on Side effects (stomach upset, some increased anxiety)  may happen before you notice a benefit.  These side effects typically go away over time. Changes to your dose of medicine or a change in medication all together is sometimes necessary. Most people need to be on medication at least 12 months. Many people will notice an improvement within two weeks but the full effect of the medication can take up to 4-6 weeks. Stopping the medication when you start feeling better often results in a return of symptoms.  Do Not discontinue medication without contacting a health care professional first.  Some  medications require gradual discontinuation/ taper and can make you sick if you stop them abruptly. Pt directed to seek medical attention If symptoms worsen or you have thoughts of suicide/homicide. Pt given numbers for: --National Suicide Hotline: 603-575-1375 --[place] Crisis Line: (215) 606-6759 --Crisis Recovery in [place]: 931-019-8887 These are available 24 hours a day, 7 days a week.  Anxiety  Vitamin D deficiency Labs pending. Continue repletion therapy. If indicated, will change repletion dosage. Eat foods rich in Vit D including milk, orange juice, yogurt with vitamin D added, salmon or mackerel, canned tuna fish, cereals with vitamin D added, and cod liver oil. Get out in the sun but make sure to wear at least SPF 30 sunscreen.  -     VITAMIN D 25 Hydroxy (Vit-D Deficiency, Fractures)  Continue all other maintenance medications.  Follow up plan: Return in 3 months (on 06/11/2023), or if symptoms worsen or fail to improve, for schedule AWV, chronic follow up.   Continue healthy lifestyle choices, including diet (rich in fruits, vegetables, and lean proteins, and low in salt and simple carbohydrates) and exercise (at least 30 minutes of moderate physical activity daily).  Educational handout given for dash diet and depression, SI/HI.  The above assessment and management plan was discussed with the patient. The patient verbalized understanding of and has agreed to the management plan. Patient is aware to call the clinic if they develop any new symptoms or if symptoms persist or worsen. Patient is aware when to return to the clinic for a follow-up visit. Patient educated on when it is appropriate to go to the emergency department.   Maryelizabeth Kaufmann NP student [facility] [phone]  I personally was present during the history, physical exam, and medical decision-making activities of this visit and have verified that the services and findings are  accurately documented in the nurse practitioner student's note.  Kari Baars, FNP-C [facility] [address] [place], [place] [address] [phone]

## 2023-03-12 ENCOUNTER — Other Ambulatory Visit: Payer: Self-pay | Admitting: Hematology

## 2023-03-12 ENCOUNTER — Other Ambulatory Visit: Payer: Self-pay | Admitting: *Deleted

## 2023-03-12 DIAGNOSIS — Z170 Estrogen receptor positive status [ER+]: Secondary | ICD-10-CM

## 2023-03-12 LAB — LIPID PANEL
Chol/HDL Ratio: 2.6 ratio (ref 0.0–4.4)
Cholesterol, Total: 159 mg/dL (ref 100–199)
HDL: 62 mg/dL (ref >39)
LDL Chol Calc (NIH): 81 mg/dL (ref 0–99)
Triglycerides: 86 mg/dL (ref 0–149)
VLDL Cholesterol Cal: 16 mg/dL (ref 5–40)

## 2023-03-12 LAB — VITAMIN D 25 HYDROXY (VIT D DEFICIENCY, FRACTURES): Vit D, 25-Hydroxy: 63.9 ng/mL (ref 30.0–100.0)

## 2023-03-12 LAB — THYROID PANEL WITH TSH
T4, Total: 7.3 ug/dL (ref 4.5–12.0)
TSH: 0.859 u[IU]/mL (ref 0.450–4.500)

## 2023-03-12 LAB — VITAMIN B12: Vitamin B-12: 1226 pg/mL (ref 232–1245)

## 2023-03-12 NOTE — Telephone Encounter (Signed)
Anastrozole refill approved.  Patient is tolerating and is to continue therapy.

## 2023-03-14 ENCOUNTER — Other Ambulatory Visit: Payer: Self-pay | Admitting: Family Medicine

## 2023-03-14 DIAGNOSIS — I10 Essential (primary) hypertension: Secondary | ICD-10-CM

## 2023-03-16 NOTE — Progress Notes (Signed)
[facility] [address] [place] [address]    Clinic Day:  03/16/2023  Referring physician: Sonny Masters, FNP  Patient Care Team: Sonny Masters, FNP as PCP - General (Family Medicine)   ASSESSMENT & PLAN:   Assessment:  1.  Stage IIIb (T4N0) left breast IDC, ER/PR positive, HER-2 negative: -Biopsy of the left breast on 10/20/2018 showed IDC.  Left axillary core biopsy was negative. -Left mastectomy on 11/12/2018, 11 cm IDC, grade 2, margins negative, 0/14 lymph node positive, ER/PR positive, HER-2 2+ by IHC and negative by FISH, Ki-67 10%. -Oncotype DX recurrence score of 2. -Anastrozole started on 12/10/2018. -XRT from 02/01/2019-03/18/2019. -Right breast mammogram on 12/16/2019 shows BI-RADS 1.   2.  Osteopenia: -DEXA scan on 02/09/2019 shows T score of -2. 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    Plan:  1.  Stage IIIb (T4N0) left breast IDC, ER/PR positive, HER-2 negative: - Physical examination shows left mastectomy site is within normal limits.  Right breast has no palpable masses or adenopathy. - She is tolerating anastrozole very well. - Labs today shows normal chemistries and CBC. - RTC 6 months for follow-up.  Will schedule right breast mammogram in July.   2.  Osteopenia: - DEXA scan on 02/09/2021 with T-score -2.1. - Continue calcium and vitamin D supplements.  Vitamin D level is normal. - Will schedule for another DEXA scan prior to next visit.   Breast Cancer therapy associated bone loss: I have recommended calcium, Vitamin D and weight bearing exercises.  No orders of the defined types were placed in this encounter.     Alben Deeds Teague,acting as a Neurosurgeon for Doreatha Massed, MD.,have documented all relevant documentation on the behalf of Doreatha Massed, MD,as directed by  Doreatha Massed, MD while in the presence of Doreatha Massed, MD.   ***  Silverton R Teague   7/28/202411:33 PM  CHIEF COMPLAINT:    Diagnosis: Malignant neoplasm of lower-outer quadrant of left breast of female, estrogen receptor positive   Cancer Staging  Malignant neoplasm of lower-outer quadrant of left breast of female, estrogen receptor positive (HCC) Staging form: Breast, AJCC 8th Edition - Clinical stage from 12/10/2018: Stage IIIB (cT4b, cN0, cM0, G2, ER+, PR+, HER2-, Oncotype DX score: 2) - Signed by Doreatha Massed, MD on 12/10/2018 - Pathologic: Stage IIIA (pT4b, pN0, cM0, G2, ER+, PR+, HER2-, Oncotype DX score: 2) - Unsigned    Prior Therapy: Left mastectomy on 11/12/2018  Current Therapy:  Anastrozole 1mg  daily    HISTORY OF PRESENT ILLNESS:   Oncology History  Malignant neoplasm of lower-outer quadrant of left breast of female, estrogen receptor positive (HCC)  10/27/2018 Initial Diagnosis   Malignant neoplasm of lower-outer quadrant of left breast of female, estrogen receptor positive (HCC)   12/10/2018 Cancer Staging   Staging form: Breast, AJCC 8th Edition - Clinical stage from 12/10/2018: Stage IIIB (cT4b, cN0, cM0, G2, ER+, PR+, HER2-, Oncotype DX score: 2) - Signed by Doreatha Massed, MD on 12/10/2018      INTERVAL HISTORY:   Jarita is a 72 y.o. female presenting to clinic today for follow up of left breast cancer. She was last seen by me on 09/17/22.  Patient underwent a DB Bone Density on 7/22 that found: The BMD measured at Forearm Radius 33% is 0.505 g/cm2 with a T-score of -2.8. She had an MM of the right breast on 7/22 that found: no mammographic evidence of malignancy.  Today, she states  that she is doing well overall. Her appetite level is at ***%. Her energy level is at ***%.  PAST MEDICAL HISTORY:   Past Medical History: 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   Hypertension    Shingles     Surgical History: Past Surgical History:  Procedure Laterality Date   CHOLECYSTECTOMY     MASTECTOMY W/  SENTINEL NODE BIOPSY Left 11/12/2018   Procedure: LEFT MASTECTOMY WITH LEFT AXILLARY SENTINEL LYMPH NODE BIOPSY;  Surgeon: Emelia Loron, MD;  Location: [facility];  Service: General;  Laterality: Left;    Social History: Social History   Socioeconomic History   Marital status: Single    Spouse name: Not on file   Number of children: Not on file   Years of education: Not on file   Highest education level: Not on file  Occupational History   Occupation: [employer]    Comment: in garden center   Occupation: [employer]    Comment: cafeteria and bus driver  Tobacco Use   Smoking status: Former    Current packs/day: 0.00    Types: Cigarettes    Quit date: 1994    Years since quitting: 30.5   Smokeless tobacco: Never  Vaping Use   Vaping status: Never Used  Substance and Sexual Activity   Alcohol use: Yes    Comment: rarely   Drug use: No   Sexual activity: Not Currently    Birth control/protection: Post-menopausal  Other Topics Concern   Not on file  Social History Narrative   Not on file   Social Determinants of Health   Financial Resource Strain: Low Risk  (06/20/2020)   Overall Financial Resource Strain (CARDIA)    Difficulty of Paying Living Expenses: Not hard at all  Food Insecurity: No Food Insecurity (06/20/2020)   Hunger Vital Sign    Worried About Running Out of Food in the Last Year: Never true    Ran Out of Food in the Last Year: Never true  Transportation Needs: No Transportation Needs (06/20/2020)   PRAPARE - Administrator, Civil Service (Medical): No    Lack of Transportation (Non-Medical): No  Physical Activity: Inactive (06/20/2020)   Exercise Vital Sign    Days of Exercise per Week: 0 days    Minutes of Exercise per Session: 0 min  Stress: Stress Concern Present (06/20/2020)   [employer] of Occupational Health - Occupational Stress Questionnaire    Feeling of Stress : To some extent   Social Connections: Socially Isolated (06/20/2020)   Social Connection and Isolation Panel [NHANES]    Frequency of Communication with Friends and Family: More than three times a week    Frequency of Social Gatherings with Friends and Family: More than three times a week    Attends Religious Services: Never    Database administrator or Organizations: No    Attends Banker Meetings: Never    Marital Status: Never married  Intimate Partner Violence: Not At Risk (06/20/2020)   Humiliation, Afraid, Rape, and Kick questionnaire    Fear of Current or Ex-Partner: No    Emotionally Abused: No    Physically Abused: No    Sexually Abused: No    Family History: Family History  Problem Relation Age of Onset   Arthritis Mother    Diabetes Mother    Stroke Mother  Asthma Father    Colon cancer Brother    Arthritis Daughter    Thyroid disease Daughter    Breast cancer Maternal Aunt     Current Medications:  Current Outpatient Medications:    acetaminophen (TYLENOL) 325 MG tablet, Take 650 mg by mouth every 6 (six) hours as needed., Disp: , Rfl:    anastrozole (ARIMIDEX) 1 MG tablet, Take 1 tablet by mouth once daily, Disp: 30 tablet, Rfl: 0   Ascorbic Acid (VITAMIN C) 1000 MG tablet, Take 2,000 mg by mouth daily., Disp: , Rfl:    calcium carbonate (OS-CAL) 1250 (500 Ca) MG chewable tablet, Chew 1 tablet by mouth 2 (two) times daily. , Disp: , Rfl:    cholecalciferol (VITAMIN D3) 25 MCG (1000 UT) tablet, Take 2,000 Units by mouth daily., Disp: , Rfl:    Cyanocobalamin ([employer] B12 GUMMIES) 500 MCG CHEW, Chew by mouth., Disp: , Rfl:    Docusate Calcium (STOOL SOFTENER PO), Take by mouth., Disp: , Rfl:    escitalopram (LEXAPRO) 10 MG tablet, Take 1 tablet (10 mg total) by mouth daily., Disp: 90 tablet, Rfl: 1   losartan (COZAAR) 50 MG tablet, Take 1 tablet by mouth once daily, Disp: 90 tablet, Rfl: 0   Multiple Vitamin (MULTIVITAMIN) tablet, Take 1 tablet by mouth daily., Disp: ,  Rfl:    Allergies: Allergies  Allergen Reactions   Sulfa Antibiotics Nausea Only    REVIEW OF SYSTEMS:   Review of Systems  Constitutional:  Negative for chills, fatigue and fever.  HENT:   Negative for lump/mass, mouth sores, nosebleeds, sore throat and trouble swallowing.   Eyes:  Negative for eye problems.  Respiratory:  Negative for cough and shortness of breath.   Cardiovascular:  Negative for chest pain, leg swelling and palpitations.  Gastrointestinal:  Negative for abdominal pain, constipation, diarrhea, nausea and vomiting.  Genitourinary:  Negative for bladder incontinence, difficulty urinating, dysuria, frequency, hematuria and nocturia.   Musculoskeletal:  Negative for arthralgias, back pain, flank pain, myalgias and neck pain.  Skin:  Negative for itching and rash.  Neurological:  Negative for dizziness, headaches and numbness.  Hematological:  Does not bruise/bleed easily.  Psychiatric/Behavioral:  Negative for depression, sleep disturbance and suicidal ideas. The patient is not nervous/anxious.   All other systems reviewed and are negative.    VITALS:   There were no vitals taken for this visit.  Wt Readings from Last 3 Encounters:  03/11/23 236 lb 12.8 oz (107.4 kg)  09/17/22 227 lb (103 kg)  09/10/22 228 lb 6.4 oz (103.6 kg)    There is no height or weight on file to calculate BMI.  Performance status (ECOG): {CHL ONC [id number]  PHYSICAL EXAM:   Physical Exam Vitals and nursing note reviewed. Exam conducted with a chaperone present.  Constitutional:      Appearance: Normal appearance.  Cardiovascular:     Rate and Rhythm: Normal rate and regular rhythm.     Pulses: Normal pulses.     Heart sounds: Normal heart sounds.  Pulmonary:     Effort: Pulmonary effort is normal.     Breath sounds: Normal breath sounds.  Abdominal:     Palpations: Abdomen is soft. There is no hepatomegaly, splenomegaly or mass.     Tenderness: There is no abdominal  tenderness.  Musculoskeletal:     Right lower leg: No edema.     Left lower leg: No edema.  Lymphadenopathy:     Cervical: No cervical adenopathy.  Right cervical: No superficial, deep or posterior cervical adenopathy.    Left cervical: No superficial, deep or posterior cervical adenopathy.     Upper Body:     Right upper body: No supraclavicular or axillary adenopathy.     Left upper body: No supraclavicular or axillary adenopathy.  Neurological:     General: No focal deficit present.     Mental Status: She is alert and oriented to person, place, and time.  Psychiatric:        Mood and Affect: Mood normal.        Behavior: Behavior normal.     LABS:      Latest Ref Rng & Units 03/10/2023    9:55 AM 09/10/2022    1:54 PM 03/04/2022   10:49 AM  CBC  WBC 4.0 - 10.5 K/uL 6.0  6.8  4.9   Hemoglobin 12.0 - 15.0 g/dL 95.6  21.3  08.6   Hematocrit 36.0 - 46.0 % 44.0  41.3  38.9   Platelets 150 - 400 K/uL 168  163  143       Latest Ref Rng & Units 03/10/2023    9:55 AM 09/10/2022    1:54 PM 03/04/2022   10:49 AM  CMP  Glucose 70 - 99 mg/dL 98  578  469   BUN 8 - 23 mg/dL 17  17  22    Creatinine 0.44 - 1.00 mg/dL 6.29  5.28  4.13   Sodium 135 - 145 mmol/L 137  136  138   Potassium 3.5 - 5.1 mmol/L 4.2  3.4  4.0   Chloride 98 - 111 mmol/L 102  101  104   CO2 22 - 32 mmol/L 27  26  28    Calcium 8.9 - 10.3 mg/dL 9.5  9.0  8.9   Total Protein 6.5 - 8.1 g/dL 7.6  7.4  6.9   Total Bilirubin 0.3 - 1.2 mg/dL 1.0  0.7  1.0   Alkaline Phos 38 - 126 U/L 72  72  69   AST 15 - 41 U/L 26  26  23    ALT 0 - 44 U/L 21  17  16       No results found for: "CEA1", "CEA" / No results found for: "CEA1", "CEA" No results found for: "PSA1" No results found for: "KGM010" No results found for: "CAN125"  No results found for: "TOTALPROTELP", "ALBUMINELP", "A1GS", "A2GS", "BETS", "BETA2SER", "GAMS", "MSPIKE", "SPEI" Lab Results  Component Value Date   TIBC 334 03/10/2023   FERRITIN 162  03/10/2023   IRONPCTSAT 30 03/10/2023   No results found for: "LDH"   STUDIES:   MM 3D SCREENING MAMMOGRAM UNILATERAL RIGHT BREAST  Result Date: 03/11/2023 CLINICAL DATA:  Screening. EXAM: DIGITAL SCREENING UNILATERAL RIGHT MAMMOGRAM WITH CAD AND TOMOSYNTHESIS TECHNIQUE: Right screening digital craniocaudal and mediolateral oblique mammograms were obtained. Right screening digital breast tomosynthesis was performed. The images were evaluated with computer-aided detection. COMPARISON:  Previous exam(s). ACR Breast Density Category c: The breasts are heterogeneously dense, which may obscure small masses. FINDINGS: The patient has had a left mastectomy. There are no findings suspicious for malignancy. IMPRESSION: No mammographic evidence of malignancy. A result letter of this screening mammogram will be mailed directly to the patient. RECOMMENDATION: Screening mammogram in one year.  (Code:SM-R-88M) BI-RADS CATEGORY  1: Negative. Electronically Signed   By: Frederico Hamman M.D.   On: 03/11/2023 14:30   DG Bone Density  Result Date: 03/10/2023 EXAM: DUAL X-RAY ABSORPTIOMETRY (DXA) FOR BONE MINERAL  DENSITY IMPRESSION: Your patient Anna Carney completed a BMD test on 03/10/2023 using the [employer] DXA System (software version: 14.10) manufactured by [employer]. The following summarizes the results of our evaluation. Technologist: PATIENT BIOGRAPHICAL: Name: Cadi, Seahorn Patient ID:  [id number] Birth Date: 16-Sep-1950 Height:     64.0 in. Gender:      Female Exam Date:  03/10/2023 Weight:     227.0 lbs. Indications: Caucasian, Post Menopausal, Parent Hip Fracture, Height Loss, Follow up Osteopenia, Family Hist. (Parent hip fracture), Hx Breast Ca Fractures: Treatments: Anastrozole, Arimidex, Calcium, Vitamin D DENSITOMETRY RESULTS: Site         Region     Measured Date Measured Age WHO Classification Young Adult T-score BMD         %Change vs. Previous Significant Change (*)  DualFemur Neck Left 5/10/2023 69.8 Oste[age] -1.4 0.842 g/cm2 -1.1% - DualFemur Neck Left 02/09/2021 67.7 Osteopenia -1.3 0.851 g/cm2 -3.6% - DualFemur Neck Left 02/09/2019 65.7 Osteopenia -1.1 0.883 g/cm2 - - DualFemur Total Mean 03/10/2023 69.8 Normal -1.0 0.885 g/cm2 0.3% - DualFemur Total Mean 02/09/2021 67.7 Normal -1.0 0.882 g/cm2 -3.3% Yes DualFemur Total Mean 02/09/2019 65.7 Normal -0.8 0.912 g/cm2 - - Left Forearm Radius 33% 03/10/2023 69.8 Osteoporosis -2.8 0.505 g/cm2 -9.9% Yes Left Forearm Radius 33% 02/09/2021 67.7 Osteopenia -2.0 0.560 g/cm2 -2.0% - Left Forearm Radius 33% 02/09/2019 65.7 Osteopenia -1.9 0.572 g/cm2 - - ASSESSMENT: The BMD measured at Forearm Radius 33% is 0.505 g/cm2 with a T-score of -2.8. This patient is considered osteoporotic according to World Health Organization [facility]) criteria. Lumbar spine was excluded due to advanced degenerative changes. The scan quality is good. Patient is not a candidate for FRAX assessment due to osteoporosis classification. Compared with the prior study on 02/09/21 , the BMD of the total mean shows no statistically significant change. World Science writer [facility]) criteria for post-menopausal, Caucasian Women: Normal:       T-score at or above -1 SD Osteopenia:   T-score between -1 and -2.5 SD Osteoporosis: T-score at or below -2.5 SD RECOMMENDATIONS: 1. All patients should optimize calcium and vitamin D intake. 2. Consider FDA-approved medical therapies in postmenopausal women and med aged 43 years and older, based on the following: a. A hip or vertebral (clinical or morphometric) fracture b. T-score< -2.5 at the femoral neck or spine after appropriate evaluation to exclude secondary causes c. Low bone mass (T-score between -1.0 and -2.5 at the femoral neck or spine) and a 10-year probability of a hip fracture > 3% or a 10-year probability of a major osteoporosis-related fracture > 20% based on the [place]-adapted WHO algorithm d. Clinician judgment and/or  patient preferences may indicate treatment for people with 10-year fracture probabilities above or below these levels FOLLOW-UP: Patients with diagnosis of osteoporosis or at high risk for fracture should have regular bone mineral density tests. For patients eligible for Medicare, routine testing is allowed once every 2 years. The testing frequency can be increased to one year for patients who have rapidly progressing disease, those who are receiving or discontinuing medical therapy to restore bone mass, or have additional risk factors. I have reviewed this report, and agree with the above findings. [facility] Electronically Signed   By: Frederico Hamman M.D.   On: 03/10/2023 10:11

## 2023-03-17 ENCOUNTER — Inpatient Hospital Stay (HOSPITAL_BASED_OUTPATIENT_CLINIC_OR_DEPARTMENT_OTHER): Payer: Medicare Other | Admitting: Hematology

## 2023-03-17 ENCOUNTER — Encounter: Payer: Self-pay | Admitting: Hematology

## 2023-03-17 VITALS — BP 132/71 | HR 65 | Temp 98.2°F | Wt 232.8 lb

## 2023-03-17 DIAGNOSIS — M810 Age-related osteoporosis without current pathological fracture: Secondary | ICD-10-CM

## 2023-03-17 DIAGNOSIS — C50512 Malignant neoplasm of lower-outer quadrant of left female breast: Secondary | ICD-10-CM

## 2023-03-17 DIAGNOSIS — Z170 Estrogen receptor positive status [ER+]: Secondary | ICD-10-CM | POA: Diagnosis not present

## 2023-03-17 MED ORDER — TAMOXIFEN CITRATE 20 MG PO TABS: 20.0000 mg | ORAL_TABLET | Freq: Every day | ORAL | 3 refills | Status: DC

## 2023-03-17 NOTE — Patient Instructions (Addendum)
[facility] at [facility] Discharge Instructions   You were seen and examined today by Dr. Ellin Saba.  He reviewed the results of your lab work which are normal/stable.   Dr. Kirtland Bouchard is switching the pill you now take (anastrozole) to a pill called Tamoxifen. You take this pill daily as prescribed. It will not cause bone weakening like the anastrozole does.   We will see you back in 6 months. We will repeat lab work at that time.   Return as scheduled.   Thank you for choosing [facility] at [facility] to provide your oncology and hematology care.  To afford each patient quality time with our provider, please arrive at least 15 minutes before your scheduled appointment time.   If you have a lab appointment with the Cancer Center please come in thru the Main Entrance and check in at the main information desk.  You need to re-schedule your appointment should you arrive 10 or more minutes late.  We strive to give you quality time with our providers, and arriving late affects you and other patients whose appointments are after yours.  Also, if you no show three or more times for appointments you may be dismissed from the clinic at the providers discretion.     Again, thank you for choosing [facility].  Our hope is that these requests will decrease the amount of time that you wait before being seen by our physicians.       _____________________________________________________________  Should you have questions after your visit to [facility], please contact our office at [phone] and follow the prompts.  Our office hours are 8:00 a.m. and 4:30 p.m. Monday - Friday.  Please note that voicemails left after 4:00 p.m. may not be returned until the following business day.  We are closed weekends and major holidays.  You do have access to a nurse 24-7, just call the main number to the clinic [phone] and do not press any  options, hold on the line and a nurse will answer the phone.    For prescription refill requests, have your pharmacy contact our office and allow 72 hours.    Due to Covid, you will need to wear a mask upon entering the hospital. If you do not have a mask, a mask will be given to you at the Main Entrance upon arrival. For doctor visits, patients may have 1 support person age 63 or older with them. For treatment visits, patients can not have anyone with them due to social distancing guidelines and our immunocompromised population.

## 2023-03-26 ENCOUNTER — Encounter: Payer: Self-pay | Admitting: *Deleted

## 2023-05-07 ENCOUNTER — Ambulatory Visit: Payer: Medicare Other | Admitting: Family Medicine

## 2023-05-07 ENCOUNTER — Encounter: Payer: Self-pay | Admitting: Family Medicine

## 2023-05-07 ENCOUNTER — Ambulatory Visit: Payer: Medicare PPO | Admitting: Family Medicine

## 2023-05-07 VITALS — BP 149/78 | HR 61 | Temp 97.5°F | Ht 64.0 in | Wt 238.6 lb

## 2023-05-07 DIAGNOSIS — F321 Major depressive disorder, single episode, moderate: Secondary | ICD-10-CM

## 2023-05-07 DIAGNOSIS — F064 Anxiety disorder due to known physiological condition: Secondary | ICD-10-CM

## 2023-05-07 MED ORDER — ESCITALOPRAM OXALATE 20 MG PO TABS
20.0000 mg | ORAL_TABLET | Freq: Every day | ORAL | 5 refills | Status: DC
Start: 2023-05-07 — End: 2023-09-04

## 2023-05-07 NOTE — Patient Instructions (Signed)
Melatonin 3 mg - 10 mg per night for sleep.

## 2023-05-07 NOTE — Progress Notes (Signed)
Subjective:  Patient ID: Anna Carney, female    DOB: October 29, 1950, 72 y.o.   MRN: [id number]  Patient Care Team: Sonny Masters, FNP as PCP - General (Family Medicine)   Chief Complaint:  Depression (8 week follow up - states her depression is the same. )   HPI: Anna Carney is a 72 y.o. female presenting on 05/07/2023 for Depression (8 week follow up - states her depression is the same. )   1. Depression, major, single episode, moderate (HCC) 2. Anxiety disorder due to medical condition She was started on Lexapro 10 mg at last visit for depression. States she does not feel her symptoms have improved or worsened. She denies side effects from the medications. Denies SI or HI.     05/07/2023   10:47 AM 03/11/2023    2:07 PM 09/10/2022    9:21 AM 03/05/2022    8:07 AM  GAD 7 : Generalized Anxiety Score  Nervous, Anxious, on Edge 0 0 0 0  Control/stop worrying 0 0 0 0  Worry too much - different things 0 0 0 0  Trouble relaxing 0 2 0 0  Restless 0 0 0 0  Easily annoyed or irritable 0 0 0 0  Afraid - awful might happen 0 0 0 0  Total GAD 7 Score 0 2 0 0  Anxiety Difficulty  Not difficult at all Not difficult at all Not difficult at all       05/07/2023   10:47 AM 03/11/2023    2:07 PM 09/10/2022    9:20 AM 03/05/2022    8:07 AM 09/05/2021    9:04 AM  Depression screen PHQ 2/9  Decreased Interest 1 2 0 0 0  Down, Depressed, Hopeless 0 0 0 0 0  PHQ - 2 Score 1 2 0 0 0  Altered sleeping 2 0 0 2 2  Tired, decreased energy 2 2 2 1 2   Change in appetite 2 2 2  0 0  Feeling bad or failure about yourself  0 0 0 0 0  Trouble concentrating 0 0 0 0 0  Moving slowly or fidgety/restless 0 0 0 0 0  Suicidal thoughts 0 0 0 0 0  PHQ-9 Score 7 6 4 3 4   Difficult doing work/chores  Not difficult at all Not difficult at all Not difficult at all Not difficult at all         Relevant past medical, surgical, family, and social history reviewed and updated as indicated.  Allergies and  medications reviewed and updated. Data reviewed: Chart in Epic.   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   Hypertension    Shingles     Past Surgical History:  Procedure Laterality Date   CHOLECYSTECTOMY     MASTECTOMY W/ SENTINEL NODE BIOPSY Left 11/12/2018   Procedure: LEFT MASTECTOMY WITH LEFT AXILLARY SENTINEL LYMPH NODE BIOPSY;  Surgeon: Emelia Loron, MD;  Location: [facility];  Service: General;  Laterality: Left;    Social History   Socioeconomic History   Marital status: Single    Spouse name: Not on file   Number of children: Not on file   Years of education: Not on file   Highest education level: Not on file  Occupational History   Occupation: [employer]    Comment: in [employer]   Occupation: [employer]  Comment: cafeteria and bus driver  Tobacco Use   Smoking status: Former    Current packs/day: 0.00    Types: Cigarettes    Quit date: 1994    Years since quitting: 30.7   Smokeless tobacco: Never  Vaping Use   Vaping status: Never Used  Substance and Sexual Activity   Alcohol use: Yes    Comment: rarely   Drug use: No   Sexual activity: Not Currently    Birth control/protection: Post-menopausal  Other Topics Concern   Not on file  Social History Narrative   Not on file   Social Determinants of Health   Financial Resource Strain: Low Risk  (06/20/2020)   Overall Financial Resource Strain (CARDIA)    Difficulty of Paying Living Expenses: Not hard at all  Food Insecurity: No Food Insecurity (06/20/2020)   Hunger Vital Sign    Worried About Running Out of Food in the Last Year: Never true    Ran Out of Food in the Last Year: Never true  Transportation Needs: No Transportation Needs (06/20/2020)   PRAPARE - Administrator, Civil Service (Medical): No    Lack of Transportation (Non-Medical): No  Physical Activity: Inactive  (06/20/2020)   Exercise Vital Sign    Days of Exercise per Week: 0 days    Minutes of Exercise per Session: 0 min  Stress: Stress Concern Present (06/20/2020)   [employer] of Occupational Health - Occupational Stress Questionnaire    Feeling of Stress : To some extent  Social Connections: Socially Isolated (06/20/2020)   Social Connection and Isolation Panel [NHANES]    Frequency of Communication with Friends and Family: More than three times a week    Frequency of Social Gatherings with Friends and Family: More than three times a week    Attends Religious Services: Never    Database administrator or Organizations: No    Attends Banker Meetings: Never    Marital Status: Never married  Intimate Partner Violence: Not At Risk (06/20/2020)   Humiliation, Afraid, Rape, and Kick questionnaire    Fear of Current or Ex-Partner: No    Emotionally Abused: No    Physically Abused: No    Sexually Abused: No    Outpatient Encounter Medications as of 05/07/2023  Medication Sig   acetaminophen (TYLENOL) 325 MG tablet Take 650 mg by mouth every 6 (six) hours as needed.   Ascorbic Acid (VITAMIN C) 1000 MG tablet Take 2,000 mg by mouth daily.   calcium carbonate (OS-CAL) 1250 (500 Ca) MG chewable tablet Chew 1 tablet by mouth 2 (two) times daily.    cholecalciferol (VITAMIN D3) 25 MCG (1000 UT) tablet Take 2,000 Units by mouth daily.   Cyanocobalamin (CVS B12 GUMMIES) 500 MCG CHEW Chew by mouth.   Docusate Calcium (STOOL SOFTENER PO) Take by mouth.   escitalopram (LEXAPRO) 20 MG tablet Take 1 tablet (20 mg total) by mouth daily.   losartan (COZAAR) 50 MG tablet Take 1 tablet by mouth once daily   Multiple Vitamin (MULTIVITAMIN) tablet Take 1 tablet by mouth daily.   tamoxifen (NOLVADEX) 20 MG tablet Take 1 tablet (20 mg total) by mouth daily.   [DISCONTINUED] escitalopram (LEXAPRO) 10 MG tablet Take 1 tablet (10 mg total) by mouth daily.   No facility-administered encounter  medications on file as of 05/07/2023.    Allergies  Allergen Reactions   Sulfa Antibiotics Nausea Only    Review of Systems  Constitutional:  Positive for activity  change, appetite change and fatigue. Negative for chills, diaphoresis, fever and unexpected weight change.  HENT: Negative.    Eyes: Negative.  Negative for photophobia and visual disturbance.  Respiratory:  Negative for cough, chest tightness and shortness of breath.   Cardiovascular:  Negative for chest pain, palpitations and leg swelling.  Gastrointestinal:  Negative for abdominal pain, blood in stool, constipation, diarrhea, nausea and vomiting.  Endocrine: Negative.  Negative for polydipsia, polyphagia and polyuria.  Genitourinary:  Negative for decreased urine volume, difficulty urinating, dysuria, frequency and urgency.  Musculoskeletal:  Negative for arthralgias and myalgias.  Skin: Negative.   Allergic/Immunologic: Negative.   Neurological:  Negative for dizziness and headaches.  Hematological: Negative.   Psychiatric/Behavioral:  Positive for sleep disturbance. Negative for agitation, behavioral problems, confusion, decreased concentration, dysphoric mood, hallucinations, self-injury and suicidal ideas. The patient is not nervous/anxious and is not hyperactive.   All other systems reviewed and are negative.       Objective:  BP (!) 149/78   Pulse 61   Temp (!) 97.5 F (36.4 C) (Temporal)   Ht 5\' 4"  (1.626 m)   Wt 238 lb 9.6 oz (108.2 kg)   SpO2 90%   BMI 40.96 kg/m    Wt Readings from Last 3 Encounters:  05/07/23 238 lb 9.6 oz (108.2 kg)  03/17/23 232 lb 12.8 oz (105.6 kg)  03/11/23 236 lb 12.8 oz (107.4 kg)    Physical Exam Vitals and nursing note reviewed.  Constitutional:      General: She is not in acute distress.    Appearance: Normal appearance. She is obese. She is not ill-appearing, toxic-appearing or diaphoretic.  HENT:     Head: Normocephalic and atraumatic.     Mouth/Throat:      Mouth: Mucous membranes are moist.  Eyes:     Conjunctiva/sclera: Conjunctivae normal.     Pupils: Pupils are equal, round, and reactive to light.  Cardiovascular:     Rate and Rhythm: Normal rate and regular rhythm.     Heart sounds: Normal heart sounds.  Pulmonary:     Effort: Pulmonary effort is normal.     Breath sounds: Normal breath sounds.  Musculoskeletal:     Right lower leg: No edema.     Left lower leg: No edema.  Skin:    General: Skin is warm and dry.     Capillary Refill: Capillary refill takes less than 2 seconds.  Neurological:     General: No focal deficit present.     Mental Status: She is alert and oriented to person, place, and time.  Psychiatric:        Mood and Affect: Mood normal.        Behavior: Behavior normal.        Thought Content: Thought content normal.        Judgment: Judgment normal.     Results for orders placed or performed in visit on 03/11/23  Thyroid Panel With TSH  Result Value Ref Range   TSH 0.859 0.450 - 4.500 uIU/mL   T4, Total 7.3 4.5 - 12.0 ug/dL   T3 Uptake Ratio 33 24 - 39 %   Free Thyroxine Index 2.4 1.2 - 4.9  Lipid panel  Result Value Ref Range   Cholesterol, Total 159 100 - 199 mg/dL   Triglycerides 86 0 - 149 mg/dL   HDL 62 >40 mg/dL   VLDL Cholesterol Cal 16 5 - 40 mg/dL   LDL Chol Calc (NIH) 81 0 - 99  mg/dL   Chol/HDL Ratio 2.6 0.0 - 4.4 ratio  VITAMIN D 25 Hydroxy (Vit-D Deficiency, Fractures)  Result Value Ref Range   Vit D, 25-Hydroxy 63.9 30.0 - 100.0 ng/mL  Vitamin B12  Result Value Ref Range   Vitamin B-12 1,226 232 - 1,245 pg/mL       Pertinent labs & imaging results that were available during my care of the patient were reviewed by me and considered in my medical decision making.  Assessment & Plan:  Anna Carney was seen today for depression.  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  Will increase dosing to see if beneficial. Having issues sleeping, trial melatonin. Discussed dosing in detail.  -     escitalopram (LEXAPRO) 20 MG tablet; Take 1 tablet (20 mg total) by mouth daily.     Continue all other maintenance medications.  Follow up plan: Return if symptoms worsen or fail to improve.   Continue healthy lifestyle choices, including diet (rich in fruits, vegetables, and lean proteins, and low in salt and simple carbohydrates) and exercise (at least 30 minutes of moderate physical activity daily).   The above assessment and management plan was discussed with the patient. The patient verbalized understanding of and has agreed to the management plan. Patient is aware to call the clinic if they develop any new symptoms or if symptoms persist or worsen. Patient is aware when to return to the clinic for a follow-up visit. Patient educated on when it is appropriate to go to the emergency department.   Kari Baars, FNP-C [facility] [phone]

## 2023-05-09 ENCOUNTER — Ambulatory Visit: Payer: Medicare Other | Admitting: Family Medicine

## 2023-06-07 ENCOUNTER — Emergency Department (HOSPITAL_BASED_OUTPATIENT_CLINIC_OR_DEPARTMENT_OTHER): Payer: Medicare PPO

## 2023-06-07 ENCOUNTER — Other Ambulatory Visit: Payer: Self-pay

## 2023-06-07 ENCOUNTER — Emergency Department (HOSPITAL_BASED_OUTPATIENT_CLINIC_OR_DEPARTMENT_OTHER)
Admission: EM | Admit: 2023-06-07 | Discharge: 2023-06-08 | Disposition: A | Discharge status: Home / Self Care | Payer: Medicare PPO | Attending: Emergency Medicine | Admitting: Emergency Medicine

## 2023-06-07 ENCOUNTER — Encounter (HOSPITAL_BASED_OUTPATIENT_CLINIC_OR_DEPARTMENT_OTHER): Payer: Self-pay

## 2023-06-07 DIAGNOSIS — S52602A Unspecified fracture of lower end of left ulna, initial encounter for closed fracture: Secondary | ICD-10-CM

## 2023-06-07 DIAGNOSIS — S52502A Unspecified fracture of the lower end of left radius, initial encounter for closed fracture: Secondary | ICD-10-CM

## 2023-06-07 DIAGNOSIS — R0902 Hypoxemia: Secondary | ICD-10-CM | POA: Insufficient documentation

## 2023-06-07 DIAGNOSIS — W108XXA Fall (on) (from) other stairs and steps, initial encounter: Secondary | ICD-10-CM | POA: Insufficient documentation

## 2023-06-07 DIAGNOSIS — M25532 Pain in left wrist: Secondary | ICD-10-CM | POA: Diagnosis present

## 2023-06-07 MED ORDER — BUPIVACAINE-EPINEPHRINE (PF) 0.5% -1:200000 IJ SOLN: 20.0000 mL | Freq: Once | INTRAMUSCULAR | Status: DC

## 2023-06-07 MED ORDER — OXYCODONE-ACETAMINOPHEN 5-325 MG PO TABS
1.0000 | ORAL_TABLET | Freq: Once | ORAL | Status: AC
  Administered 2023-06-07: 1 via ORAL
  Filled 2023-06-07: qty 1

## 2023-06-07 MED ORDER — PROPOFOL 10 MG/ML IV BOLUS
INTRAVENOUS | Status: AC | PRN
  Administered 2023-06-07: 50 mg via INTRAVENOUS
  Administered 2023-06-07: 30 mg via INTRAVENOUS

## 2023-06-07 MED ORDER — FENTANYL CITRATE PF 50 MCG/ML IJ SOSY
50.0000 ug | PREFILLED_SYRINGE | INTRAMUSCULAR | Status: DC | PRN
  Administered 2023-06-07: 50 ug via NASAL
  Filled 2023-06-07 (×2): qty 1

## 2023-06-07 MED ORDER — OXYCODONE HCL 5 MG PO TABS: 5.0000 mg | ORAL_TABLET | Freq: Four times a day (QID) | ORAL | 0 refills | Status: AC | PRN

## 2023-06-07 MED ORDER — BUPIVACAINE HCL (PF) 0.5 % IJ SOLN
10.0000 mL | Freq: Once | INTRAMUSCULAR | Status: AC
  Administered 2023-06-07: 10 mL

## 2023-06-07 MED ORDER — PROPOFOL 10 MG/ML IV BOLUS
0.5000 mg/kg | Freq: Once | INTRAVENOUS | Status: AC
  Administered 2023-06-07: 50 mg via INTRAVENOUS
  Filled 2023-06-07: qty 20

## 2023-06-07 NOTE — ED Notes (Signed)
Pt oxygen discontinued at this time and placed on RA. Pt respiratory status stable at this time.    06/07/23 2237  Therapy Vitals  Pulse Rate 66  Resp 17  Patient Position (if appropriate) Lying  MEWS Score/Color  MEWS Score 1  MEWS Score Color Green  Respiratory Assessment  Assessment Type Assess only  Respiratory Pattern Regular;Unlabored;Symmetrical  Chest Assessment Chest expansion symmetrical  Cough None  Bilateral Breath Sounds Clear;Diminished  R Upper  Breath Sounds Clear  L Upper Breath Sounds Clear  R Lower Breath Sounds Diminished  L Lower Breath Sounds Diminished  Oxygen Therapy/Pulse Ox  O2 Device Room Air  O2 Therapy Room air  FiO2 (%) 21 %  SpO2 97 %

## 2023-06-07 NOTE — ED Provider Notes (Signed)
[place] EMERGENCY DEPARTMENT AT [facility] Provider Note   CSN: [id number] Arrival date & time: 06/07/23  1753     History {Add pertinent medical, surgical, social history, OB history to HPI:1} Chief Complaint  Patient presents with   Anna Carney is a 72 y.o. female with history of osteoporosis, vitamin D deficiency   Fall       Home Medications Prior to Admission medications   Medication Sig Start Date End Date Taking? Authorizing Provider  acetaminophen (TYLENOL) 325 MG tablet Take 650 mg by mouth every 6 (six) hours as needed.    [provider]  Ascorbic Acid (VITAMIN C) 1000 MG tablet Take 2,000 mg by mouth daily.    [provider]  calcium carbonate (OS-CAL) 1250 (500 Ca) MG chewable tablet Chew 1 tablet by mouth 2 (two) times daily.     [provider]  cholecalciferol (VITAMIN D3) 25 MCG (1000 UT) tablet Take 2,000 Units by mouth daily.    [provider]  Cyanocobalamin (CVS B12 GUMMIES) 500 MCG CHEW Chew by mouth.    [provider]  Docusate Calcium (STOOL SOFTENER PO) Take by mouth.    [provider]  escitalopram (LEXAPRO) 20 MG tablet Take 1 tablet (20 mg total) by mouth daily. 05/07/23   Sonny Masters, FNP  losartan (COZAAR) 50 MG tablet Take 1 tablet by mouth once daily 03/14/23   Sonny Masters, FNP  Multiple Vitamin (MULTIVITAMIN) tablet Take 1 tablet by mouth daily.    [provider]  tamoxifen (NOLVADEX) 20 MG tablet Take 1 tablet (20 mg total) by mouth daily. 03/17/23   Doreatha Massed, MD      Allergies    Sulfa antibiotics    Review of Systems   Review of Systems  Physical Exam Updated Vital Signs BP (!) 119/58 (BP Location: Right Arm)   Pulse 73   Temp 98.1 F (36.7 C) (Oral)   Resp 20   Ht 5\' 4"  (1.626 m)   Wt 103.4 kg   SpO2 93%   BMI 39.14 kg/m  Physical Exam  ED Results / Procedures / Treatments   Labs (all labs ordered are listed, but  only abnormal results are displayed) Labs Reviewed - No data to display  EKG None  Radiology No results found.  Procedures Procedures  {Document cardiac monitor, telemetry assessment procedure when appropriate:1}  Medications Ordered in ED Medications  fentaNYL (SUBLIMAZE) injection 50 mcg (has no administration in time range)    ED Course/ Medical Decision Making/ A&P   {   Click here for ABCD2, HEART and other calculatorsREFRESH Note before signing :1}                              Medical Decision Making Amount and/or Complexity of Data Reviewed Radiology: ordered.  Risk Prescription drug management.   71 y.o. female with pertinent past medical history of *** presents to the ED for concern of ***   Differential diagnosis includes but is not limited to ***  ED Course:  *** Upon re-evaluation, patient ***.    Impression: ***  Disposition:  {AF ED Dispo:29713} Return precautions given.  Lab Tests: I Ordered, and personally interpreted labs.  The pertinent results include:  ***  Imaging Studies ordered: I ordered imaging studies including x-ray left wrist I independently visualized the imaging with scope of interpretation limited to determining acute life threatening conditions  related to emergency care. Imaging showed *** I agree with the radiologist interpretation   Cardiac Monitoring: / EKG: The patient was maintained on a cardiac monitor.  I personally viewed and interpreted the cardiac monitored which showed an underlying rhythm of: ***   Consultations Obtained: I requested consultation with the hand Dr. Leanne Chang,  and discussed lab and imaging findings as well as pertinent plan - they recommend: Performing reduction in the ER, placing patient in a splint and getting a CT scan postreduction for surgical planning purposes.   Co morbidities that complicate the patient evaluation  ***  Social Determinants of Health:  {OZHY:86578}        {Document critical care time when appropriate:1} {Document review of labs and clinical decision tools ie heart score, Chads2Vasc2 etc:1}  {Document your independent review of radiology images, and any outside records:1} {Document your discussion with family members, caretakers, and with consultants:1} {Document social determinants of health affecting pt's care:1} {Document your decision making why or why not admission, treatments were needed:1} Final Clinical Impression(s) / ED Diagnoses Final diagnoses:  None    Rx / DC Orders ED Discharge Orders     None

## 2023-06-07 NOTE — ED Provider Notes (Incomplete)
[place] EMERGENCY DEPARTMENT AT [facility] Provider Note   CSN: [id number] Arrival date & time: 06/07/23  1753     History {Add pertinent medical, surgical, social history, OB history to HPI:1} Chief Complaint  Patient presents with  . Fall    Anna Carney is a 72 y.o. female with history of osteoporosis, vitamin D deficiency who presents with for left wrist pain after tripping and falling on the stairs earlier today.  She caught herself with her left hand and reported immediate pain and swelling of this area.  Denies any numbness or tingling in her fingers.   Fall       Home Medications Prior to Admission medications   Medication Sig Start Date End Date Taking? Authorizing Provider  oxyCODONE (ROXICODONE) 5 MG immediate release tablet Take 1 tablet (5 mg total) by mouth every 6 (six) hours as needed for up to 5 days for severe pain (pain score 7-10) or breakthrough pain (Pain not controlled with tylenol and ibuprofen). 06/07/23 06/12/23 Yes Arabella Merles, PA-C  acetaminophen (TYLENOL) 325 MG tablet Take 650 mg by mouth every 6 (six) hours as needed.    [provider]  Ascorbic Acid (VITAMIN C) 1000 MG tablet Take 2,000 mg by mouth daily.    [provider]  calcium carbonate (OS-CAL) 1250 (500 Ca) MG chewable tablet Chew 1 tablet by mouth 2 (two) times daily.     [provider]  cholecalciferol (VITAMIN D3) 25 MCG (1000 UT) tablet Take 2,000 Units by mouth daily.    [provider]  Cyanocobalamin (CVS B12 GUMMIES) 500 MCG CHEW Chew by mouth.    [provider]  Docusate Calcium (STOOL SOFTENER PO) Take by mouth.    [provider]  escitalopram (LEXAPRO) 20 MG tablet Take 1 tablet (20 mg total) by mouth daily. 05/07/23   Sonny Masters, FNP  losartan (COZAAR) 50 MG tablet Take 1 tablet by mouth once daily 03/14/23   Sonny Masters, FNP  Multiple Vitamin (MULTIVITAMIN) tablet Take 1 tablet by mouth daily.     [provider]  tamoxifen (NOLVADEX) 20 MG tablet Take 1 tablet (20 mg total) by mouth daily. 03/17/23   Doreatha Massed, MD      Allergies    Sulfa antibiotics    Review of Systems   Review of Systems  Musculoskeletal:        Left wrist pain    Physical Exam Updated Vital Signs BP (!) 106/54 (BP Location: Right Arm)   Pulse 73   Temp 98.1 F (36.7 C) (Oral)   Resp 17   Ht 5\' 4"  (1.626 m)   Wt 103.4 kg   SpO2 96%   BMI 39.14 kg/m  Physical Exam Vitals and nursing note reviewed.  Constitutional:      Appearance: Normal appearance.  HENT:     Head: Atraumatic.  Pulmonary:     Effort: Pulmonary effort is normal.  Musculoskeletal:     Comments: Obvious deformity of the left wrist and edema.  No erythema.  No open wounds or fractures. Unable to perform wrist flexion and extension secondary to pain and deformity.  Able to wiggle 1st through 5th digits of the left hand  Under to palpation over the distal radius and ulna.  No tenderness palpation of the left elbow diffusely.  No tenderness palpation of the left carpal bones, 1st through 5th metacarpals, 1st through 5th phalanges  2+ radial pulse bilaterally  Neurological:     General:  No focal deficit present.     Mental Status: She is alert.  Psychiatric:        Mood and Affect: Mood normal.        Behavior: Behavior normal.     ED Results / Procedures / Treatments   Labs (all labs ordered are listed, but only abnormal results are displayed) Labs Reviewed - No data to display  EKG None  Radiology DG Wrist Complete Left  Result Date: 06/07/2023 CLINICAL DATA:  Left wrist pain and deformity after injury. Tripped and fall on stairs. EXAM: LEFT WRIST - COMPLETE 3+ VIEW COMPARISON:  None Available. FINDINGS: Highly comminuted, displaced and angulated distal radius fracture. Fracture extends to the radiocarpal joint. The distal radioulnar joint is disrupted. Displaced and comminuted fracture of the  distal ulna with an oblique metaphyseal fracture and ulnar styloid fracture. No definite carpal bone fracture. Prominent soft tissue edema about the wrist. IMPRESSION: 1. Highly comminuted, displaced and angulated distal radius fracture with intra-articular extension. 2. Displaced and comminuted distal ulnar fracture. Electronically Signed   By: Narda Rutherford M.D.   On: 06/07/2023 20:41    Procedures Procedures  {Document cardiac monitor, telemetry assessment procedure when appropriate:1}  Medications Ordered in ED Medications  fentaNYL (SUBLIMAZE) injection 50 mcg (50 mcg Nasal Given 06/07/23 1858)  propofol (DIPRIVAN) 10 mg/mL bolus/IV push 51.7 mg (50 mg Intravenous Given 06/07/23 2136)  bupivacaine(PF) (MARCAINE) 0.5 % injection 10 mL (10 mLs Infiltration Given 06/07/23 2138)  propofol (DIPRIVAN) 10 mg/mL bolus/IV push (30 mg Intravenous Given 06/07/23 2120)  oxyCODONE-acetaminophen (PERCOCET/ROXICET) 5-325 MG per tablet 1 tablet (1 tablet Oral Given 06/07/23 2256)    ED Course/ Medical Decision Making/ A&P   {   Click here for ABCD2, HEART and other calculatorsREFRESH Note before signing :1}                              Medical Decision Making Amount and/or Complexity of Data Reviewed Radiology: ordered.  Risk Prescription drug management.   72 y.o. female with pertinent past medical history of *** presents to the ED for concern of ***   Differential diagnosis includes but is not limited to ***  ED Course:  *** Upon re-evaluation, patient ***.    Impression: ***  Disposition:  {AF ED Dispo:29713} Return precautions given.  Lab Tests: I Ordered, and personally interpreted labs.  The pertinent results include:  ***  Imaging Studies ordered: I ordered imaging studies including x-ray left wrist I independently visualized the imaging with scope of interpretation limited to determining acute life threatening conditions related to emergency care. Imaging showed  *** I agree with the radiologist interpretation   Cardiac Monitoring: / EKG: The patient was maintained on a cardiac monitor.  I personally viewed and interpreted the cardiac monitored which showed an underlying rhythm of: ***   Consultations Obtained: I requested consultation with the hand Dr. Leanne Chang,  and discussed lab and imaging findings as well as pertinent plan - they recommend: Performing reduction in the ER, placing patient in a splint and getting a CT scan postreduction for surgical planning purposes.   Co morbidities that complicate the patient evaluation  ***  Social Determinants of Health:  {WUJW:11914}       {Document critical care time when appropriate:1} {Document review of labs and clinical decision tools ie heart score, Chads2Vasc2 etc:1}  {Document your independent review of radiology images, and any outside records:1} {Document your discussion with  family members, caretakers, and with consultants:1} {Document social determinants of health affecting pt's care:1} {Document your decision making why or why not admission, treatments were needed:1} Final Clinical Impression(s) / ED Diagnoses Final diagnoses:  Closed fracture of distal end of left radius, unspecified fracture morphology, initial encounter  Closed fracture of distal end of left ulna, unspecified fracture morphology, initial encounter    Rx / DC Orders ED Discharge Orders          Ordered    oxyCODONE (ROXICODONE) 5 MG immediate release tablet  Every 6 hours PRN        06/07/23 2254

## 2023-06-07 NOTE — ED Notes (Signed)
RT placed pt on ETCO2 [place] 4 lpm for preoxygenation for left wrist reduction conscious/procedural sedation. Pt respiratory status is stable w/no distress noted. BLBS clear/dim. BVM bag inflated at bedside, suction setup, and airway cart available and ready. RT will monitor during procedure.    06/07/23 2051  Therapy Vitals  Pulse Rate 69  Resp (!) 21  BP (!) 140/77  MEWS Score/Color  MEWS Score 1  MEWS Score Color Green  Respiratory Assessment  Assessment Type Assess only  Respiratory Pattern Regular;Unlabored;Symmetrical  Chest Assessment Chest expansion symmetrical  Cough None  Bilateral Breath Sounds Clear;Diminished  R Upper  Breath Sounds Clear  L Upper Breath Sounds Clear  R Lower Breath Sounds Diminished  L Lower Breath Sounds Diminished  Oxygen Therapy/Pulse Ox  O2 Device Nasal Cannula;Other (Comment) (ETCO2)  O2 Therapy Oxygen  O2 Flow Rate (L/min) 4 L/min  FiO2 (%) 36 %  SpO2 100 %

## 2023-06-07 NOTE — Discharge Instructions (Addendum)
You have a fracture of both forearm bones (your radius and ulna) in your left forearm/wrist.  We have reduced the fracture here today and placed you in a splint.  Follow-up with the orthopedic Dr. Frazier Butt listed below.  You need to call Monday to schedule an appointment as soon as possible.  Take 600mg  ibuprofen, then 3 hours later take 1000mg  tylenol, then 3 hours later 600mg  ibuprofen, then 3 hours later 1000mg  tylenol, so on and so forth for pain control.   You have been prescribed Oxycodone-this is a narcotic/controlled substance medication that has potential addicting qualities.  You may take 1 tablet every 6 hours as needed for severe pain not controlled with ibuprofen and Tylenol.  Do not drive or operate heavy machinery when taking this medicine as it can be sedating. Do not drink alcohol or take other sedating medications when taking this medicine for safety reasons.  Keep this out of reach of small children.  You were given a dose here today, your next dose cannot be any sooner than 5 AM this morning.  Return to the ER for any pain not controlled with the pain medications listed above, numbness or tingling of the fingers, any other new or concerning symptoms.

## 2023-06-07 NOTE — ED Provider Notes (Signed)
Physical Exam  BP (!) 109/44 (BP Location: Right Arm)   Pulse 75   Temp 98 F (36.7 C) (Oral)   Resp (!) 22   Ht 5\' 4"  (1.626 m)   Wt 103.4 kg   SpO2 99%   BMI 39.14 kg/m   Physical Exam  Procedures  .Sedation  Date/Time: 06/07/2023 9:00 PM  Performed by: Benjiman Core, MD Authorized by: Benjiman Core, MD   Consent:    Consent obtained:  Written   Consent given by:  Patient   Risks discussed:  Allergic reaction, dysrhythmia, inadequate sedation, nausea, vomiting, respiratory compromise necessitating ventilatory assistance and intubation and prolonged hypoxia resulting in organ damage   Alternatives discussed:  Analgesia without sedation Universal protocol:    Immediately prior to procedure, a time out was called: yes     Patient identity confirmed:  Arm band and verbally with patient Indications:    Procedure performed:  Fracture reduction   Procedure necessitating sedation performed by:  Physician performing sedation Pre-sedation assessment:    Time since last food or drink:  2 hrs   ASA classification: class 3 - patient with severe systemic disease     Mallampati score:  II - soft palate, uvula, fauces visible   Pre-sedation assessments completed and reviewed: airway patency, cardiovascular function, hydration status, mental status, pain level, respiratory function and temperature     Pre-sedation assessments completed and reviewed: pre-procedure nausea and vomiting status not reviewed     Pre-sedation assessment completed:  06/07/2023 9:00 PM Immediate pre-procedure details:    Reassessment: Patient reassessed immediately prior to procedure     Reviewed: vital signs     Verified: bag valve mask available and emergency equipment available   Procedure details (see MAR for exact dosages):    Preoxygenation:  Nasal cannula   Sedation:  Propofol   Intended level of sedation: deep   Analgesia: hematoma block.   Intra-procedure events: none     Total Provider  sedation time (minutes):  10 Post-procedure details:    Post-sedation assessment completed:  06/07/2023 9:39 PM   Attendance: Constant attendance by certified staff until patient recovered     Recovery: Patient returned to pre-procedure baseline     Post-sedation assessments completed and reviewed: airway patency, cardiovascular function, hydration status, mental status and respiratory function     Post-sedation assessments completed and reviewed: pain score not reviewed     Patient is stable for discharge or admission: yes     Procedure completion:  Tolerated well, no immediate complications .Ortho Injury Treatment  Date/Time: 06/07/2023 9:00 PM  Performed by: Benjiman Core, MD Authorized by: Benjiman Core, MD   Consent:    Consent obtained:  Written   Consent given by:  Patient   Risks discussed:  Fracture, irreducible dislocation, restricted joint movement, recurrent dislocation, nerve damage and vascular damage   Alternatives discussed:  No treatment Universal protocol:    Patient identity confirmed:  Arm band and verbally with patientInjury location: wrist Location details: left wrist Injury type: fracture Fracture type: distal radius Pre-procedure neurovascular assessment: neurovascularly intact Pre-procedure distal perfusion: normal Pre-procedure neurological function: normal Anesthesia: hematoma block  Anesthesia: Local anesthesia used: yes Local Anesthetic: bupivacaine 0.5% without epinephrine Anesthetic total: 5 mL  Patient sedated: Yes. Refer to sedation procedure documentation for details of sedation. Manipulation performed: yes Skin traction used: yes Reduction successful: yes X-ray confirmed reduction: yes Immobilization: splint Splint type: sugar tong Splint Applied by: ED Provider and ED Tech Supplies used: Ortho-Glass Post-procedure neurovascular  assessment: post-procedure neurovascularly intact Post-procedure distal perfusion:  normal Post-procedure neurological function: normal     ED Course / MDM    Medical Decision Making Amount and/or Complexity of Data Reviewed Radiology: ordered.  Risk Prescription drug management.   ***

## 2023-06-07 NOTE — ED Triage Notes (Signed)
Pt presents with a L wrist injury after tripping and falling on stairs.

## 2023-06-07 NOTE — ED Notes (Signed)
RT present for conscious/procedural sedation for left wrist reduction. Pt respiratory status stable prior to reduction on ETCO2 [place] 4 Lpm. Pt BLBS clear/dim. RT to monitor during procedure and while at [facility].     06/07/23 2113  Therapy Vitals  Pulse Rate 81  Resp (!) 24  Patient Position (if appropriate) Lying  MEWS Score/Color  MEWS Score 1  MEWS Score Color Green  Respiratory Assessment  Assessment Type Assess only  Respiratory Pattern Regular;Unlabored;Symmetrical  Chest Assessment Chest expansion symmetrical  Cough None  Bilateral Breath Sounds Clear;Diminished  R Upper  Breath Sounds Clear  L Upper Breath Sounds Clear  R Lower Breath Sounds Diminished  L Lower Breath Sounds Diminished  Oxygen Therapy/Pulse Ox  O2 Device (S)  Nasal Cannula;Other (Comment) (ETCO2)  O2 Therapy Oxygen  O2 Flow Rate (L/min) 4 L/min  FiO2 (%) (S)  36 %  SpO2 100 %

## 2023-06-07 NOTE — ED Notes (Signed)
Pt oxygen titrated down from 4Lpm to 2 Lpm. Pt respiratory status stable at this time. RT will continue to monitor while at [facility].    06/07/23 2149  Therapy Vitals  Pulse Rate 71  Resp (!) 24  BP (!) 90/44  Patient Position (if appropriate) Lying  MEWS Score/Color  MEWS Score 2  MEWS Score Color Yellow  Respiratory Assessment  Assessment Type Assess only  Respiratory Pattern Regular;Unlabored;Symmetrical  Chest Assessment Chest expansion symmetrical  Cough None  Bilateral Breath Sounds Clear;Diminished  R Upper  Breath Sounds Clear  L Upper Breath Sounds Clear  R Lower Breath Sounds Diminished  L Lower Breath Sounds Diminished  Oxygen Therapy/Pulse Ox  O2 Device Nasal Cannula;Other (Comment) (ETCO2)  O2 Therapy Oxygen  O2 Flow Rate (L/min) 2 L/min  FiO2 (%) 28 %  SpO2 100 %

## 2023-06-09 ENCOUNTER — Encounter: Payer: Self-pay | Admitting: Orthopedic Surgery

## 2023-06-25 ENCOUNTER — Other Ambulatory Visit: Payer: Self-pay | Admitting: Family Medicine

## 2023-06-25 DIAGNOSIS — I10 Essential (primary) hypertension: Secondary | ICD-10-CM

## 2023-06-27 DIAGNOSIS — M25532 Pain in left wrist: Secondary | ICD-10-CM | POA: Diagnosis not present

## 2023-06-27 DIAGNOSIS — S52602A Unspecified fracture of lower end of left ulna, initial encounter for closed fracture: Secondary | ICD-10-CM | POA: Diagnosis not present

## 2023-06-27 DIAGNOSIS — S52502A Unspecified fracture of the lower end of left radius, initial encounter for closed fracture: Secondary | ICD-10-CM | POA: Diagnosis not present

## 2023-07-21 DIAGNOSIS — S52602A Unspecified fracture of lower end of left ulna, initial encounter for closed fracture: Secondary | ICD-10-CM | POA: Diagnosis not present

## 2023-07-21 DIAGNOSIS — M25532 Pain in left wrist: Secondary | ICD-10-CM | POA: Diagnosis not present

## 2023-07-21 DIAGNOSIS — S52572A Other intraarticular fracture of lower end of left radius, initial encounter for closed fracture: Secondary | ICD-10-CM | POA: Diagnosis not present

## 2023-07-24 DIAGNOSIS — M170 Bilateral primary osteoarthritis of knee: Secondary | ICD-10-CM | POA: Diagnosis not present

## 2023-08-06 ENCOUNTER — Ambulatory Visit: Payer: Medicare PPO | Admitting: Family Medicine

## 2023-08-07 DIAGNOSIS — M25532 Pain in left wrist: Secondary | ICD-10-CM | POA: Diagnosis not present

## 2023-09-02 DIAGNOSIS — S52602A Unspecified fracture of lower end of left ulna, initial encounter for closed fracture: Secondary | ICD-10-CM | POA: Diagnosis not present

## 2023-09-02 DIAGNOSIS — S52572A Other intraarticular fracture of lower end of left radius, initial encounter for closed fracture: Secondary | ICD-10-CM | POA: Diagnosis not present

## 2023-09-02 DIAGNOSIS — M25532 Pain in left wrist: Secondary | ICD-10-CM | POA: Diagnosis not present

## 2023-09-04 ENCOUNTER — Ambulatory Visit: Payer: Medicare PPO | Admitting: Family Medicine

## 2023-09-04 ENCOUNTER — Encounter: Payer: Self-pay | Admitting: Family Medicine

## 2023-09-04 VITALS — BP 129/70 | HR 62 | Temp 97.1°F | Ht 64.0 in | Wt 238.2 lb

## 2023-09-04 DIAGNOSIS — E538 Deficiency of other specified B group vitamins: Secondary | ICD-10-CM | POA: Diagnosis not present

## 2023-09-04 DIAGNOSIS — F064 Anxiety disorder due to known physiological condition: Secondary | ICD-10-CM | POA: Diagnosis not present

## 2023-09-04 DIAGNOSIS — I10 Essential (primary) hypertension: Secondary | ICD-10-CM

## 2023-09-04 DIAGNOSIS — E559 Vitamin D deficiency, unspecified: Secondary | ICD-10-CM | POA: Diagnosis not present

## 2023-09-04 DIAGNOSIS — M810 Age-related osteoporosis without current pathological fracture: Secondary | ICD-10-CM | POA: Diagnosis not present

## 2023-09-04 DIAGNOSIS — F321 Major depressive disorder, single episode, moderate: Secondary | ICD-10-CM

## 2023-09-04 DIAGNOSIS — C50512 Malignant neoplasm of lower-outer quadrant of left female breast: Secondary | ICD-10-CM | POA: Diagnosis not present

## 2023-09-04 DIAGNOSIS — Z79811 Long term (current) use of aromatase inhibitors: Secondary | ICD-10-CM | POA: Diagnosis not present

## 2023-09-04 DIAGNOSIS — Z170 Estrogen receptor positive status [ER+]: Secondary | ICD-10-CM

## 2023-09-04 MED ORDER — LOSARTAN POTASSIUM 50 MG PO TABS: 50.0000 mg | ORAL_TABLET | Freq: Every day | ORAL | 1 refills | Status: DC

## 2023-09-04 MED ORDER — ESCITALOPRAM OXALATE 20 MG PO TABS: 20.0000 mg | ORAL_TABLET | Freq: Every day | ORAL | 1 refills | Status: DC

## 2023-09-04 NOTE — Progress Notes (Signed)
Subjective:  Patient ID: Anna Carney, female    DOB: 07/15/1951, 73 y.o.   MRN: [id number]  Patient Care Team: Sonny Masters, FNP as PCP - General (Family Medicine)   Chief Complaint:  Medical Management of Chronic Issues (Chronic follow up )   HPI: Anna Carney is a 73 y.o. female presenting on 09/04/2023 for Medical Management of Chronic Issues (Chronic follow up )   Discussed the use of AI scribe software for clinical note transcription with the patient, who gave verbal consent to proceed.  History of Present Illness   The patient, with a history of breast cancer, osteoporosis, hypertension, and anxiety/depression, presents for a routine follow-up. They recently had wrist surgery following a fall in October, with no reported complications. They continue to follow up with oncology and are currently on Tamoxifen, with no reported side effects. Their next mammogram is scheduled for April.  Regarding their mental health, they report that their anxiety and depression are somewhat better on Lexapro, but they are currently dealing with multiple stressors. They are also on Losartan for hypertension, with occasional headaches, which they attribute to their known history of tension headaches. They deny any changes in vision or symptoms of chest pain, leg swelling, shortness of breath, or palpitations.  The patient is also on calcium and vitamin D for osteoporosis, and vitamin B12, though they deny a history of deficiency. They report occasional leg swelling, typically at the end of the day and more so in the summer months. They deny any recent upper respiratory infections.         09/04/2023    8:29 AM 05/07/2023   10:47 AM 03/11/2023    2:07 PM 09/10/2022    9:20 AM 03/05/2022    8:07 AM  Depression screen PHQ 2/9  Decreased Interest 1 1 2  0 0  Down, Depressed, Hopeless 1 0 0 0 0  PHQ - 2 Score 2 1 2  0 0  Altered sleeping 1 2 0 0 2  Tired, decreased energy 1 2 2 2 1   Change in  appetite 1 2 2 2  0  Feeling bad or failure about yourself  0 0 0 0 0  Trouble concentrating 0 0 0 0 0  Moving slowly or fidgety/restless 0 0 0 0 0  Suicidal thoughts 0 0 0 0 0  PHQ-9 Score 5 7 6 4 3   Difficult doing work/chores Somewhat difficult  Not difficult at all Not difficult at all Not difficult at all      09/04/2023    8:29 AM 05/07/2023   10:47 AM 03/11/2023    2:07 PM 09/10/2022    9:21 AM  GAD 7 : Generalized Anxiety Score  Nervous, Anxious, on Edge 0 0 0 0  Control/stop worrying 0 0 0 0  Worry too much - different things 0 0 0 0  Trouble relaxing 0 0 2 0  Restless 0 0 0 0  Easily annoyed or irritable 0 0 0 0  Afraid - awful might happen 0 0 0 0  Total GAD 7 Score 0 0 2 0  Anxiety Difficulty Not difficult at all  Not difficult at all Not difficult at all        Relevant past medical, surgical, family, and social history reviewed and updated as indicated.  Allergies and medications reviewed and updated. Data reviewed: Chart in Epic.   Past Medical History:  Diagnosis Date   Anxiety    Arthritis    Cancer (HCC)  10/2018   left breast IDC   Hypertension    Shingles     Past Surgical History:  Procedure Laterality Date   CHOLECYSTECTOMY     MASTECTOMY W/ SENTINEL NODE BIOPSY Left 11/12/2018   Procedure: LEFT MASTECTOMY WITH LEFT AXILLARY SENTINEL LYMPH NODE BIOPSY;  Surgeon: Emelia Loron, MD;  Location: [facility];  Service: General;  Laterality: Left;   WRIST SURGERY Left     Social History   Socioeconomic History   Marital status: Single    Spouse name: Not on file   Number of children: Not on file   Years of education: Not on file   Highest education level: 12th grade  Occupational History   Occupation: [employer]    Comment: in garden center   Occupation: [employer]    Comment: cafeteria and bus driver  Tobacco Use   Smoking status: Former    Current packs/day: 0.00    Types: Cigarettes    Quit  date: 1994    Years since quitting: 31.0   Smokeless tobacco: Never  Vaping Use   Vaping status: Never Used  Substance and Sexual Activity   Alcohol use: Yes    Comment: rarely   Drug use: No   Sexual activity: Not Currently    Birth control/protection: Post-menopausal  Other Topics Concern   Not on file  Social History Narrative   Not on file   Social Drivers of Health   Financial Resource Strain: Low Risk  (09/03/2023)   Overall Financial Resource Strain (CARDIA)    Difficulty of Paying Living Expenses: Not hard at all  Food Insecurity: No Food Insecurity (09/03/2023)   Hunger Vital Sign    Worried About Running Out of Food in the Last Year: Never true    Ran Out of Food in the Last Year: Never true  Transportation Needs: No Transportation Needs (09/03/2023)   PRAPARE - Administrator, Civil Service (Medical): No    Lack of Transportation (Non-Medical): No  Physical Activity: Unknown (09/03/2023)   Exercise Vital Sign    Days of Exercise per Week: 0 days    Minutes of Exercise per Session: Not on file  Stress: No Stress Concern Present (09/03/2023)   [employer] of Occupational Health - Occupational Stress Questionnaire    Feeling of Stress : Only a little  Social Connections: Moderately Isolated (09/03/2023)   Social Connection and Isolation Panel [NHANES]    Frequency of Communication with Friends and Family: More than three times a week    Frequency of Social Gatherings with Friends and Family: Twice a week    Attends Religious Services: Never    Database administrator or Organizations: No    Attends Engineer, structural: Not on file    Marital Status: Living with partner  Intimate Partner Violence: Not At Risk (06/20/2020)   Humiliation, Afraid, Rape, and Kick questionnaire    Fear of Current or Ex-Partner: No    Emotionally Abused: No    Physically Abused: No    Sexually Abused: No    Outpatient Encounter Medications as of 09/04/2023   Medication Sig   acetaminophen (TYLENOL) 325 MG tablet Take 650 mg by mouth every 6 (six) hours as needed.   Ascorbic Acid (VITAMIN C) 1000 MG tablet Take 2,000 mg by mouth daily.   calcium carbonate (OS-CAL) 1250 (500 Ca) MG chewable tablet Chew 1 tablet by mouth 2 (two) times daily.    cholecalciferol (VITAMIN  D3) 25 MCG (1000 UT) tablet Take 2,000 Units by mouth daily.   Cyanocobalamin ([employer] B12 GUMMIES) 500 MCG CHEW Chew by mouth.   Docusate Calcium (STOOL SOFTENER PO) Take by mouth.   Multiple Vitamin (MULTIVITAMIN) tablet Take 1 tablet by mouth daily.   tamoxifen (NOLVADEX) 20 MG tablet Take 1 tablet (20 mg total) by mouth daily.   [DISCONTINUED] escitalopram (LEXAPRO) 20 MG tablet Take 1 tablet (20 mg total) by mouth daily.   [DISCONTINUED] losartan (COZAAR) 50 MG tablet Take 1 tablet by mouth once daily   escitalopram (LEXAPRO) 20 MG tablet Take 1 tablet (20 mg total) by mouth daily.   losartan (COZAAR) 50 MG tablet Take 1 tablet (50 mg total) by mouth daily.   No facility-administered encounter medications on file as of 09/04/2023.    Allergies  Allergen Reactions   Sulfa Antibiotics Nausea Only and Other (See Comments)    Pertinent ROS per HPI, otherwise unremarkable      Objective:  BP 129/70   Pulse 62   Temp (!) 97.1 F (36.2 C)   Ht 5\' 4"  (1.626 m)   Wt 238 lb 3.2 oz (108 kg)   SpO2 92%   BMI 40.89 kg/m    Wt Readings from Last 3 Encounters:  09/04/23 238 lb 3.2 oz (108 kg)  06/07/23 228 lb (103.4 kg)  05/07/23 238 lb 9.6 oz (108.2 kg)    Physical Exam Vitals and nursing note reviewed.  Constitutional:      General: She is not in acute distress.    Appearance: Normal appearance. She is obese. She is not ill-appearing, toxic-appearing or diaphoretic.  HENT:     Head: Normocephalic and atraumatic.     Nose: Nose normal.     Mouth/Throat:     Mouth: Mucous membranes are moist.     Pharynx: Oropharynx is clear.  Eyes:     Conjunctiva/sclera:  Conjunctivae normal.     Pupils: Pupils are equal, round, and reactive to light.  Cardiovascular:     Rate and Rhythm: Normal rate and regular rhythm.     Heart sounds: Normal heart sounds.  Pulmonary:     Effort: Pulmonary effort is normal.     Breath sounds: Normal breath sounds.  Musculoskeletal:     Cervical back: Neck supple.     Right lower leg: No edema.     Left lower leg: No edema.     Comments: Splint on left wrist  Skin:    General: Skin is warm and dry.     Capillary Refill: Capillary refill takes less than 2 seconds.  Neurological:     General: No focal deficit present.     Mental Status: She is alert and oriented to person, place, and time.  Psychiatric:        Mood and Affect: Mood normal.        Behavior: Behavior normal.        Thought Content: Thought content normal.        Judgment: Judgment normal.     Results for orders placed or performed in visit on 03/11/23  Thyroid Panel With TSH   Collection Time: 03/11/23  2:52 PM  Result Value Ref Range   TSH 0.859 0.450 - 4.500 uIU/mL   T4, Total 7.3 4.5 - 12.0 ug/dL   T3 Uptake Ratio 33 24 - 39 %   Free Thyroxine Index 2.4 1.2 - 4.9  Lipid panel   Collection Time: 03/11/23  2:52 PM  Result  Value Ref Range   Cholesterol, Total 159 100 - 199 mg/dL   Triglycerides 86 0 - 149 mg/dL   HDL 62 >16 mg/dL   VLDL Cholesterol Cal 16 5 - 40 mg/dL   LDL Chol Calc (NIH) 81 0 - 99 mg/dL   Chol/HDL Ratio 2.6 0.0 - 4.4 ratio  VITAMIN D 25 Hydroxy (Vit-D Deficiency, Fractures)   Collection Time: 03/11/23  2:52 PM  Result Value Ref Range   Vit D, 25-Hydroxy 63.9 30.0 - 100.0 ng/mL  Vitamin B12   Collection Time: 03/11/23  2:52 PM  Result Value Ref Range   Vitamin B-12 1,226 232 - 1,245 pg/mL       Pertinent labs & imaging results that were available during my care of the patient were reviewed by me and considered in my medical decision making.  Assessment & Plan:  Joyleen was seen today for medical management of  chronic issues.  Diagnoses and all orders for this visit:  Depression, major, single episode, moderate (HCC) -     escitalopram (LEXAPRO) 20 MG tablet; Take 1 tablet (20 mg total) by mouth daily. -     Thyroid Panel With TSH  Essential hypertension -     losartan (COZAAR) 50 MG tablet; Take 1 tablet (50 mg total) by mouth daily. -     CMP14+EGFR -     Lipid panel -     Thyroid Panel With TSH -     Anemia Profile B  Morbid obesity (HCC) -     CMP14+EGFR -     Lipid panel -     Thyroid Panel With TSH -     Anemia Profile B -     VITAMIN D 25 Hydroxy (Vit-D Deficiency, Fractures)  Anxiety disorder due to medical condition -     escitalopram (LEXAPRO) 20 MG tablet; Take 1 tablet (20 mg total) by mouth daily. -     CMP14+EGFR -     Thyroid Panel With TSH -     Anemia Profile B  Long term current use of aromatase inhibitor -     CMP14+EGFR -     Lipid panel -     Thyroid Panel With TSH -     Anemia Profile B -     VITAMIN D 25 Hydroxy (Vit-D Deficiency, Fractures)  Malignant neoplasm of lower-outer quadrant of left breast of female, estrogen receptor positive (HCC) -     CMP14+EGFR -     Anemia Profile B  Age-related osteoporosis without current pathological fracture -     CMP14+EGFR -     VITAMIN D 25 Hydroxy (Vit-D Deficiency, Fractures)  Vitamin D deficiency -     CMP14+EGFR -     VITAMIN D 25 Hydroxy (Vit-D Deficiency, Fractures)  Vitamin B12 deficiency -     Anemia Profile B     Assessment and Plan    Postoperative Wrist Surgery Underwent wrist surgery on October 24th following a fall on October 19th. Healing is progressing well. Recent follow-up with orthopedic surgeon on Tuesday. - Continue follow-up with orthopedic surgeon as scheduled  Breast Cancer Under oncology care. Upcoming blood work on January 23rd and follow-up with oncologist on January 30th. Taking tamoxifen without side effects. Next mammogram in April. - Continue tamoxifen - Attend blood  work appointment on January 23rd - Attend oncology follow-up on January 30th - Schedule mammogram in April  Anxiety and Depression Symptoms somewhat improved with Lexapro, but situational stressors present. No thoughts of self-harm  or harm to others. - Continue Lexapro - Monitor symptoms and report if they worsen  Hypertension Taking losartan without significant side effects. Occasional headaches consistent with tension headaches, not associated with elevated blood pressure. - Continue losartan - Monitor blood pressure, especially during headaches  Osteoporosis Taking calcium and vitamin D supplements. Also taking vitamin B12, though not previously deficient. - Check vitamin B12 levels - Continue calcium and vitamin D supplementation  General Health Maintenance Up to date with medications and supplements. No new health issues reported. - Order labs today - Send refills for losartan and Lexapro to [facility] in [place] - Schedule follow-up appointment in six months unless new issues arise  Follow-up - Schedule follow-up appointment in six months - Review lab results and address any issues as needed.          Continue all other maintenance medications.  Follow up plan: Return in about 6 months (around 03/03/2024), or if symptoms worsen or fail to improve, for chronic follow up.   Continue healthy lifestyle choices, including diet (rich in fruits, vegetables, and lean proteins, and low in salt and simple carbohydrates) and exercise (at least 30 minutes of moderate physical activity daily).  Educational handout given for health maintenance   The above assessment and management plan was discussed with the patient. The patient verbalized understanding of and has agreed to the management plan. Patient is aware to call the clinic if they develop any new symptoms or if symptoms persist or worsen. Patient is aware when to return to the clinic for a follow-up visit. Patient educated on when  it is appropriate to go to the emergency department.   Kari Baars, FNP-C [facility] [phone]

## 2023-09-05 LAB — ANEMIA PROFILE B
Basophils Absolute: 0 10*3/uL (ref 0.0–0.2)
Basos: 1 %
EOS (ABSOLUTE): 0.2 10*3/uL (ref 0.0–0.4)
Eos: 5 %
Ferritin: 360 ng/mL — ABNORMAL HIGH (ref 15–150)
Folate: 15 ng/mL (ref >3.0)
Hematocrit: 43.7 % (ref 34.0–46.6)
Hemoglobin: 14.3 g/dL (ref 11.1–15.9)
Immature Grans (Abs): 0 10*3/uL (ref 0.0–0.1)
Immature Granulocytes: 0 %
Iron Saturation: 36 % (ref 15–55)
Iron: 99 ug/dL (ref 27–139)
Lymphocytes Absolute: 1.2 10*3/uL (ref 0.7–3.1)
Lymphs: 25 %
MCH: 31.8 pg (ref 26.6–33.0)
MCHC: 32.7 g/dL (ref 31.5–35.7)
MCV: 97 fL (ref 79–97)
Monocytes Absolute: 0.4 10*3/uL (ref 0.1–0.9)
Monocytes: 8 %
Neutrophils Absolute: 3 10*3/uL (ref 1.4–7.0)
Neutrophils: 61 %
Platelets: 160 10*3/uL (ref 150–450)
RBC: 4.49 x10E6/uL (ref 3.77–5.28)
RDW: 12.6 % (ref 11.7–15.4)
Retic Ct Pct: 2 % (ref 0.6–2.6)
Total Iron Binding Capacity: 272 ug/dL (ref 250–450)
UIBC: 173 ug/dL (ref 118–369)
Vitamin B-12: 696 pg/mL (ref 232–1245)
WBC: 4.9 10*3/uL (ref 3.4–10.8)

## 2023-09-05 LAB — THYROID PANEL WITH TSH
Free Thyroxine Index: 2.3 (ref 1.2–4.9)
T3 Uptake Ratio: 27 % (ref 24–39)
T4, Total: 8.7 ug/dL (ref 4.5–12.0)
TSH: 1.45 u[IU]/mL (ref 0.450–4.500)

## 2023-09-05 LAB — LIPID PANEL
Chol/HDL Ratio: 2.5 {ratio} (ref 0.0–4.4)
Cholesterol, Total: 152 mg/dL (ref 100–199)
HDL: 60 mg/dL (ref >39)
LDL Chol Calc (NIH): 75 mg/dL (ref 0–99)
Triglycerides: 89 mg/dL (ref 0–149)
VLDL Cholesterol Cal: 17 mg/dL (ref 5–40)

## 2023-09-05 LAB — CMP14+EGFR
ALT: 15 [IU]/L (ref 0–32)
AST: 21 [IU]/L (ref 0–40)
Albumin: 4 g/dL (ref 3.9–4.9)
Alkaline Phosphatase: 84 [IU]/L (ref 44–121)
BUN/Creatinine Ratio: 25 (ref 12–28)
BUN: 20 mg/dL (ref 8–27)
Bilirubin Total: 0.4 mg/dL (ref 0.0–1.2)
CO2: 25 mmol/L (ref 20–29)
Calcium: 9.3 mg/dL (ref 8.7–10.3)
Chloride: 101 mmol/L (ref 96–106)
Creatinine, Ser: 0.81 mg/dL (ref 0.57–1.00)
Globulin, Total: 2.4 g/dL (ref 1.5–4.5)
Glucose: 90 mg/dL (ref 70–99)
Potassium: 4.3 mmol/L (ref 3.5–5.2)
Sodium: 140 mmol/L (ref 134–144)
Total Protein: 6.4 g/dL (ref 6.0–8.5)
eGFR: 78 mL/min/{1.73_m2} (ref >59)

## 2023-09-05 LAB — VITAMIN D 25 HYDROXY (VIT D DEFICIENCY, FRACTURES): Vit D, 25-Hydroxy: 42.7 ng/mL (ref 30.0–100.0)

## 2023-09-09 DIAGNOSIS — S52591A Other fractures of lower end of right radius, initial encounter for closed fracture: Secondary | ICD-10-CM | POA: Diagnosis not present

## 2023-09-11 ENCOUNTER — Inpatient Hospital Stay: Payer: Medicare PPO | Attending: Hematology

## 2023-09-11 ENCOUNTER — Telehealth: Payer: Self-pay | Admitting: *Deleted

## 2023-09-11 DIAGNOSIS — Z853 Personal history of malignant neoplasm of breast: Secondary | ICD-10-CM | POA: Insufficient documentation

## 2023-09-11 DIAGNOSIS — C50512 Malignant neoplasm of lower-outer quadrant of left female breast: Secondary | ICD-10-CM

## 2023-09-11 LAB — CBC WITH DIFFERENTIAL/PLATELET
Abs Immature Granulocytes: 0.02 10*3/uL (ref 0.00–0.07)
Basophils Absolute: 0 10*3/uL (ref 0.0–0.1)
Basophils Relative: 1 %
Eosinophils Absolute: 0.2 10*3/uL (ref 0.0–0.5)
Eosinophils Relative: 3 %
HCT: 42.4 % (ref 36.0–46.0)
Hemoglobin: 13.7 g/dL (ref 12.0–15.0)
Immature Granulocytes: 0 %
Lymphocytes Relative: 20 %
Lymphs Abs: 1.3 10*3/uL (ref 0.7–4.0)
MCH: 30.9 pg (ref 26.0–34.0)
MCHC: 32.3 g/dL (ref 30.0–36.0)
MCV: 95.7 fL (ref 80.0–100.0)
Monocytes Absolute: 0.5 10*3/uL (ref 0.1–1.0)
Monocytes Relative: 8 %
Neutro Abs: 4.3 10*3/uL (ref 1.7–7.7)
Neutrophils Relative %: 68 %
Platelets: 142 10*3/uL — ABNORMAL LOW (ref 150–400)
RBC: 4.43 MIL/uL (ref 3.87–5.11)
RDW: 12.7 % (ref 11.5–15.5)
WBC: 6.4 10*3/uL (ref 4.0–10.5)
nRBC: 0 % (ref 0.0–0.2)

## 2023-09-11 LAB — COMPREHENSIVE METABOLIC PANEL
ALT: 17 U/L (ref 0–44)
AST: 26 U/L (ref 15–41)
Albumin: 3.9 g/dL (ref 3.5–5.0)
Alkaline Phosphatase: 63 U/L (ref 38–126)
Anion gap: 11 (ref 5–15)
BUN: 15 mg/dL (ref 8–23)
CO2: 24 mmol/L (ref 22–32)
Calcium: 8.8 mg/dL — ABNORMAL LOW (ref 8.9–10.3)
Chloride: 104 mmol/L (ref 98–111)
Creatinine, Ser: 0.73 mg/dL (ref 0.44–1.00)
GFR, Estimated: >60 mL/min (ref >60)
Glucose, Bld: 100 mg/dL — ABNORMAL HIGH (ref 70–99)
Potassium: 3.7 mmol/L (ref 3.5–5.1)
Sodium: 139 mmol/L (ref 135–145)
Total Bilirubin: 0.8 mg/dL (ref 0.0–1.2)
Total Protein: 6.9 g/dL (ref 6.5–8.1)

## 2023-09-11 NOTE — Telephone Encounter (Signed)
Opened in error

## 2023-09-16 DIAGNOSIS — S52501A Unspecified fracture of the lower end of right radius, initial encounter for closed fracture: Secondary | ICD-10-CM | POA: Diagnosis not present

## 2023-09-16 DIAGNOSIS — M25532 Pain in left wrist: Secondary | ICD-10-CM | POA: Diagnosis not present

## 2023-09-16 DIAGNOSIS — S52572A Other intraarticular fracture of lower end of left radius, initial encounter for closed fracture: Secondary | ICD-10-CM | POA: Diagnosis not present

## 2023-09-16 DIAGNOSIS — S52602A Unspecified fracture of lower end of left ulna, initial encounter for closed fracture: Secondary | ICD-10-CM | POA: Diagnosis not present

## 2023-09-18 ENCOUNTER — Inpatient Hospital Stay: Payer: Medicare PPO | Admitting: Oncology

## 2023-09-18 VITALS — BP 152/94 | HR 80 | Resp 18 | Wt 235.0 lb

## 2023-09-18 DIAGNOSIS — Z853 Personal history of malignant neoplasm of breast: Secondary | ICD-10-CM | POA: Diagnosis not present

## 2023-09-18 DIAGNOSIS — Z170 Estrogen receptor positive status [ER+]: Secondary | ICD-10-CM | POA: Diagnosis not present

## 2023-09-18 DIAGNOSIS — M810 Age-related osteoporosis without current pathological fracture: Secondary | ICD-10-CM | POA: Diagnosis not present

## 2023-09-18 DIAGNOSIS — C50512 Malignant neoplasm of lower-outer quadrant of left female breast: Secondary | ICD-10-CM | POA: Diagnosis not present

## 2023-09-18 NOTE — Progress Notes (Signed)
[facility] [address] [place] [address]    Clinic Day:  09/22/23   Referring physician: Sonny Masters, FNP  Patient Care Team: Sonny Masters, FNP as PCP - General (Family Medicine)   ASSESSMENT & PLAN:   Assessment:  1.  Stage IIIb (T4N0) left breast IDC, ER/PR positive, HER-2 negative: -Biopsy of the left breast on 10/20/2018 showed IDC.  Left axillary core biopsy was negative. -Left mastectomy on 11/12/2018, 11 cm IDC, grade 2, margins negative, 0/14 lymph node positive, ER/PR positive, HER-2 2+ by IHC and negative by FISH, Ki-67 10%. -Oncotype DX recurrence score of 2. -Anastrozole started on 12/10/2018. -She was switched to tamoxifen in July 2024 due to osteoporosis -XRT from 02/01/2019-03/18/2019. -Right breast mammogram on 12/16/2019 shows BI-RADS 1.   2.  Osteopenia: -DEXA scan on 02/09/2019 shows T score of -2. 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    Plan:  1.  Stage IIIb (T4N0) left breast IDC, ER/PR positive, HER-2 negative: - Labs: Normal LFTs and creatinine.  CBC grossly normal. - Right breast mammogram (03/10/2023): BI-RADS Category 1.  Repeat mammogram in July 2025. - She was switched from anastrozole to tamoxifen due to worsening osteoporosis. -She has stopped taking tamoxifen due to 2 falls resulting in fractured wrist. -We discussed BCI testing.  Will get this sent out today.  2.  Osteoporosis: - I have reviewed bone density test results from 03/10/2023: T-score -2.8, previously -2.1 and 2.0. - Bisphosphonates were considered not appropriate given most recent dental concerns including the need for extractions and bad bottom teeth.   -She was switched from anastrozole to tamoxifen due to her osteoporosis.  3.  Wrist fractures: -Fractured her left wrist in October from a fall and right wrist in January 2025 from a fall.  Reports she feels off balance since she started taking tamoxifen. -Plan is to send for BCI testing to see if  extended hormone therapy beyond 5 years is worth it.  Will call patient after results are received.  Breast Cancer therapy associated bone loss: I have recommended calcium, Vitamin D and weight bearing exercises.  Orders Placed This Encounter  Procedures   MM 3D SCREENING MAMMOGRAM UNILATERAL RIGHT BREAST    NAS No implants [facility]    Standing Status:   Future    Expected Date:   03/17/2024    Expiration Date:   09/17/2024    Reason for Exam (SYMPTOM  OR DIAGNOSIS REQUIRED):   ananul    Preferred imaging location?:   [facility]   Comprehensive metabolic panel    Standing Status:   Future    Expected Date:   03/14/2024    Expiration Date:   09/17/2024   CBC with Differential    Standing Status:   Future    Expected Date:   03/14/2024    Expiration Date:   09/17/2024   I spent 25 minutes dedicated to the care of this patient (face-to-face and non-face-to-face) on the date of the encounter to include what is described in the assessment and plan.    Mauro Kaufmann, NP   2/3/20251:44 PM  CHIEF COMPLAINT:   Diagnosis: Malignant neoplasm of lower-outer quadrant of left breast of female, estrogen receptor positive    Cancer Staging  Malignant neoplasm of lower-outer quadrant of left breast of female, estrogen receptor positive (HCC) Staging form: Breast, AJCC 8th Edition - Clinical stage from 12/10/2018: Stage IIIB (cT4b, cN0, cM0, G2, ER+, PR+, HER2-,  Oncotype DX score: 2) - Signed by Doreatha Massed, MD on 12/10/2018 - Pathologic: Stage IIIA (pT4b, pN0, cM0, G2, ER+, PR+, HER2-, Oncotype DX score: 2) - Unsigned    Prior Therapy: Left mastectomy on 11/12/2018  Current Therapy:  Anastrozole 1mg  daily/tamoxifen 20 mg daily   Switch due to osteoporosis.  Recently stopped about 2 weeks ago due to balance concerns.   HISTORY OF PRESENT ILLNESS:   Oncology History  Malignant neoplasm of lower-outer quadrant of left breast of female, estrogen receptor positive (HCC)   10/27/2018 Initial Diagnosis   Malignant neoplasm of lower-outer quadrant of left breast of female, estrogen receptor positive (HCC)   12/10/2018 Cancer Staging   Staging form: Breast, AJCC 8th Edition - Clinical stage from 12/10/2018: Stage IIIB (cT4b, cN0, cM0, G2, ER+, PR+, HER2-, Oncotype DX score: 2) - Signed by Doreatha Massed, MD on 12/10/2018      INTERVAL HISTORY:   Anna Carney is a 73 y.o. female presenting to clinic today for follow up of left breast cancer. She was last seen by Dr. Elbert Ewings on 03/17/23.   Patient underwent a Bone Density test on 7/22 that found: The BMD measured at Forearm Radius 33% is 0.505 g/cm2 with a T-score of -2.8.   She had an MM of the right breast on 7/22 that found: no mammographic evidence of malignancy.  During her last visit, she was switched from anastrozole to tamoxifen due to worsening bone health.  In the interim, she was evaluated by the emergency department in Oct 2024 at [facility] for a fall fracturing her left distal radius and ulna.  She underwent reduction which was successful.  She fell again in January 2025 and unfortunately broke her left wrist.  She is currently now in a cast.  They are trying to avoid surgery if possible.  Reports, she has never fallen a day in her life until she switched medications from anastrozole to tamoxifen.  She feels off balance since she has been taking this medication.  Denies any pain.  Energy levels are 50% appetite levels are 75%.  PAST MEDICAL HISTORY:   Past Medical History: 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   Hypertension    Shingles     Surgical History: Past Surgical History:  Procedure Laterality Date   CHOLECYSTECTOMY     MASTECTOMY W/ SENTINEL NODE BIOPSY Left 11/12/2018   Procedure: LEFT MASTECTOMY WITH LEFT AXILLARY SENTINEL LYMPH NODE BIOPSY;  Surgeon: Emelia Loron, MD;  Location: [facility];  Service: General;  Laterality: Left;   WRIST SURGERY Left     Social History: Social History   Socioeconomic History   Marital status: Single    Spouse name: Not on file   Number of children: Not on file   Years of education: Not on file   Highest education level: 12th grade  Occupational History   Occupation: [employer]    Comment: in [employer]   Occupation: [employer]    Comment: cafeteria and bus driver  Tobacco Use   Smoking status: Former    Current packs/day: 0.00    Types: Cigarettes    Quit date: 1994    Years since quitting: 31.1   Smokeless tobacco: Never  Vaping Use   Vaping status: Never Used  Substance and Sexual Activity   Alcohol use: Yes    Comment: rarely   Drug use:  No   Sexual activity: Not Currently    Birth control/protection: Post-menopausal  Other Topics Concern   Not on file  Social History Narrative   Not on file   Social Drivers of Health   Financial Resource Strain: Low Risk  (09/03/2023)   Overall Financial Resource Strain (CARDIA)    Difficulty of Paying Living Expenses: Not hard at all  Food Insecurity: No Food Insecurity (09/03/2023)   Hunger Vital Sign    Worried About Running Out of Food in the Last Year: Never true    Ran Out of Food in the Last Year: Never true  Transportation Needs: No Transportation Needs (09/03/2023)   PRAPARE - Administrator, Civil Service (Medical): No    Lack of Transportation (Non-Medical): No  Physical Activity: Unknown (09/03/2023)   Exercise Vital Sign    Days of Exercise per Week: 0 days    Minutes of Exercise per Session: Not on file  Stress: No Stress Concern Present (09/03/2023)   [employer] of Occupational Health - Occupational Stress Questionnaire    Feeling of Stress : Only a little  Social Connections: Moderately Isolated (09/03/2023)   Social Connection and Isolation Panel [NHANES]    Frequency of Communication with Friends and Family:  More than three times a week    Frequency of Social Gatherings with Friends and Family: Twice a week    Attends Religious Services: Never    Database administrator or Organizations: No    Attends Engineer, structural: Not on file    Marital Status: Living with partner  Intimate Partner Violence: Not At Risk (06/20/2020)   Humiliation, Afraid, Rape, and Kick questionnaire    Fear of Current or Ex-Partner: No    Emotionally Abused: No    Physically Abused: No    Sexually Abused: No    Family History: Family History  Problem Relation Age of Onset   Arthritis Mother    Diabetes Mother    Stroke Mother    Asthma Father    Colon cancer Brother    Arthritis Daughter    Thyroid disease Daughter    Breast cancer Maternal Aunt     Current Medications:  Current Outpatient Medications:    acetaminophen (TYLENOL) 325 MG tablet, Take 650 mg by mouth every 6 (six) hours as needed., Disp: , Rfl:    Ascorbic Acid (VITAMIN C) 1000 MG tablet, Take 2,000 mg by mouth daily., Disp: , Rfl:    calcium carbonate (OS-CAL) 1250 (500 Ca) MG chewable tablet, Chew 1 tablet by mouth 2 (two) times daily. , Disp: , Rfl:    cholecalciferol (VITAMIN D3) 25 MCG (1000 UT) tablet, Take 2,000 Units by mouth daily., Disp: , Rfl:    Cyanocobalamin ([employer]S B12 GUMMIES) 500 MCG CHEW, Chew by mouth., Disp: , Rfl:    Docusate Calcium (STOOL SOFTENER PO), Take by mouth., Disp: , Rfl:    escitalopram (LEXAPRO) 20 MG tablet, Take 1 tablet (20 mg total) by mouth daily., Disp: 90 tablet, Rfl: 1   losartan (COZAAR) 50 MG tablet, Take 1 tablet (50 mg total) by mouth daily., Disp: 90 tablet, Rfl: 1   Multiple Vitamin (MULTIVITAMIN) tablet, Take 1 tablet by mouth daily., Disp: , Rfl:    tamoxifen (NOLVADEX) 20 MG tablet, Take 1 tablet (20 mg total) by mouth daily. (Patient not taking: Reported on 09/18/2023), Disp: 90 tablet, Rfl: 3   Allergies: Allergies  Allergen Reactions   Sulfa Antibiotics Nausea Only and Other (See  Comments)    REVIEW OF SYSTEMS:   Review of Systems  Neurological:  Positive for dizziness.     VITALS:   Blood pressure (!) 152/94, pulse 80, resp. rate 18, weight 235 lb (106.6 kg), SpO2 100%.  Wt Readings from Last 3 Encounters:  09/18/23 235 lb (106.6 kg)  09/04/23 238 lb 3.2 oz (108 kg)  06/07/23 228 lb (103.4 kg)    Body mass index is 40.34 kg/m. Breast Exam Chaperone: Chapman Moss, RN  Performance status (ECOG): 1 - Symptomatic but completely ambulatory  PHYSICAL EXAM:   Physical Exam Constitutional:      Appearance: Normal appearance.  Cardiovascular:     Rate and Rhythm: Normal rate and regular rhythm.  Pulmonary:     Effort: Pulmonary effort is normal.     Breath sounds: Normal breath sounds.  Chest:     Comments: Deferred. Abdominal:     General: Bowel sounds are normal.     Palpations: Abdomen is soft.  Musculoskeletal:        General: No swelling. Normal range of motion.     Right wrist: Deformity (In cast) present.  Neurological:     Mental Status: She is alert and oriented to person, place, and time. Mental status is at baseline.     LABS:      Latest Ref Rng & Units 09/11/2023    1:03 PM 09/04/2023    8:38 AM 03/10/2023    9:55 AM  CBC  WBC 4.0 - 10.5 K/uL 6.4  4.9  6.0   Hemoglobin 12.0 - 15.0 g/dL 40.9  81.1  91.4   Hematocrit 36.0 - 46.0 % 42.4  43.7  44.0   Platelets 150 - 400 K/uL 142  160  168       Latest Ref Rng & Units 09/11/2023    1:03 PM 09/04/2023    8:38 AM 03/10/2023    9:55 AM  CMP  Glucose 70 - 99 mg/dL 782  90  98   BUN 8 - 23 mg/dL 15  20  17    Creatinine 0.44 - 1.00 mg/dL 9.56  2.13  0.86   Sodium 135 - 145 mmol/L 139  140  137   Potassium 3.5 - 5.1 mmol/L 3.7  4.3  4.2   Chloride 98 - 111 mmol/L 104  101  102   CO2 22 - 32 mmol/L 24  25  27    Calcium 8.9 - 10.3 mg/dL 8.8  9.3  9.5   Total Protein 6.5 - 8.1 g/dL 6.9  6.4  7.6   Total Bilirubin 0.0 - 1.2 mg/dL 0.8  0.4  1.0   Alkaline Phos 38 - 126 U/L 63  84   72   AST 15 - 41 U/L 26  21  26    ALT 0 - 44 U/L 17  15  21       No results found for: "CEA1", "CEA" / No results found for: "CEA1", "CEA" No results found for: "PSA1" No results found for: "VHQ469" No results found for: "CAN125"  No results found for: "TOTALPROTELP", "ALBUMINELP", "A1GS", "A2GS", "BETS", "BETA2SER", "GAMS", "MSPIKE", "SPEI" Lab Results  Component Value Date   TIBC 272 09/04/2023   TIBC 334 03/10/2023   FERRITIN 360 (H) 09/04/2023   FERRITIN 162 03/10/2023   IRONPCTSAT 36 09/04/2023   IRONPCTSAT 30 03/10/2023   No results found for: "LDH"   STUDIES:   No results found.

## 2023-09-19 NOTE — Progress Notes (Signed)
Notification received that [facility] does not test T4 tumors such as this patients and the test was automatically cancelled. Boneta Lucks, NP made aware.

## 2023-10-14 DIAGNOSIS — S52501A Unspecified fracture of the lower end of right radius, initial encounter for closed fracture: Secondary | ICD-10-CM | POA: Diagnosis not present

## 2023-10-14 DIAGNOSIS — M25532 Pain in left wrist: Secondary | ICD-10-CM | POA: Diagnosis not present

## 2023-10-22 DIAGNOSIS — M170 Bilateral primary osteoarthritis of knee: Secondary | ICD-10-CM | POA: Diagnosis not present

## 2023-10-28 ENCOUNTER — Encounter: Payer: Self-pay | Admitting: Oncology

## 2023-11-07 DIAGNOSIS — S52501A Unspecified fracture of the lower end of right radius, initial encounter for closed fracture: Secondary | ICD-10-CM | POA: Diagnosis not present

## 2023-11-20 ENCOUNTER — Encounter: Payer: Self-pay | Admitting: Family Medicine

## 2023-11-20 ENCOUNTER — Ambulatory Visit (INDEPENDENT_AMBULATORY_CARE_PROVIDER_SITE_OTHER): Payer: Medicare PPO | Admitting: Family Medicine

## 2023-11-20 VITALS — BP 113/65 | HR 69 | Temp 97.7°F | Ht 64.0 in | Wt 242.2 lb

## 2023-11-20 DIAGNOSIS — Z0000 Encounter for general adult medical examination without abnormal findings: Secondary | ICD-10-CM | POA: Diagnosis not present

## 2023-11-20 NOTE — Patient Instructions (Signed)
What are Advance Directives? ?A living will allows you to document your wishes concerning medical treatments at the end of life.  ? ?Before your living will can guide medical decision-making two physicians must certify: ?You are unable to make medical decisions,  ?You are in the medical condition specified in the state's living will law (such as "terminal illness" or "permanent unconsciousness"),  ?Other requirements also may apply, depending upon the state. ?A medical power of attorney (or healthcare proxy) allows you to appoint a person you trust as your healthcare agent (or surrogate decision maker), who is authorized to make medical decisions on your behalf.  ? ?Before a medical power of attorney goes into effect a person?s physician must conclude that they are unable to make their own medical decisions. In addition: ?If a person regains the ability to make decisions, the agent cannot continue to act on the person's behalf.  ?Many states have additional requirements that apply only to decisions about life-sustaining medical treatments.  ?For example, before your agent can refuse a life-sustaining treatment on your behalf, a second physician may have to confirm your doctor's assessment that you are incapable of making treatment decisions. ?What Else Do I Need to Know?  ?Advance directives are legally valid throughout the [place]. While you do not need a lawyer to fill out an advance directive, your advance directive becomes legally valid as soon as you sign them in front of the required witnesses. The laws governing advance directives vary from state to state, so it is important to complete and sign advance directives that comply with your state's law. Also, advance directives can have different titles in different states.  ?Emergency medical technicians cannot honor living wills or medical powers of attorney. Once emergency personnel have been called, they must do what is necessary to stabilize a person  for transfer to a hospital, both from accident sites and from a home or other facility. After a physician fully evaluates the person's condition and determines the underlying conditions, advance directives can be implemented.  ?One state?s advance directive does not always work in another state. Some states do honor advance directives from another state; others will honor out-of-state advance directives as long as they are similar to the state's own law; and some states do not have an answer to this question. The best solution is if you spend a significant amount of time in more than one state, you should complete the advance directives for all the states you spend a significant amount of time in.  ?Advance directives do not expire. An advance directive remains in effect until you change it. If you complete a new advance directive, it invalidates the previous one.  ?You should review your advance directives periodically to ensure that they still reflect your wishes. If you want to change anything in an advance directive once you have completed it, you should complete a whole new document. ?[place] ? ? ? ?[facility], www.nhpco.org ? ?

## 2023-11-20 NOTE — Progress Notes (Signed)
 Subjective:    Anna Carney is a 73 y.o. female who presents for a Welcome to Medicare exam.   Cardiac Risk Factors include: advanced age (>65men, >55 women) Declined EKG today.     Objective:    Today's Vitals   11/20/23 1416  BP: 113/65  Pulse: 69  Temp: 97.7 F (36.5 C)  SpO2: 96%  Weight: 242 lb 3.2 oz (109.9 kg)  Height: 5\' 4"  (1.626 m)  Body mass index is 41.57 kg/m.  Medications Outpatient Encounter Medications as of 11/20/2023  Medication Sig   acetaminophen (TYLENOL) 325 MG tablet Take 650 mg by mouth every 6 (six) hours as needed.   Ascorbic Acid (VITAMIN C) 1000 MG tablet Take 2,000 mg by mouth daily.   calcium carbonate (OS-CAL) 1250 (500 Ca) MG chewable tablet Chew 1 tablet by mouth 2 (two) times daily.    cholecalciferol (VITAMIN D3) 25 MCG (1000 UT) tablet Take 2,000 Units by mouth daily.   Cyanocobalamin (CVS B12 GUMMIES) 500 MCG CHEW Chew by mouth.   Docusate Calcium (STOOL SOFTENER PO) Take by mouth.   escitalopram (LEXAPRO) 20 MG tablet Take 1 tablet (20 mg total) by mouth daily.   losartan (COZAAR) 50 MG tablet Take 1 tablet (50 mg total) by mouth daily.   Multiple Vitamin (MULTIVITAMIN) tablet Take 1 tablet by mouth daily.   [DISCONTINUED] tamoxifen (NOLVADEX) 20 MG tablet Take 1 tablet (20 mg total) by mouth daily. (Patient not taking: Reported on 09/18/2023)   No facility-administered encounter medications on file as of 11/20/2023.     History: 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   Hypertension    Shingles    Past Surgical History:  Procedure Laterality Date   CHOLECYSTECTOMY     MASTECTOMY W/ SENTINEL NODE BIOPSY Left 11/12/2018   Procedure: LEFT MASTECTOMY WITH LEFT AXILLARY SENTINEL LYMPH NODE BIOPSY;  Surgeon: Emelia Loron, MD;  Location: [facility];  Service: General;  Laterality: Left;   WRIST SURGERY Left     Family History  Problem Relation  Age of Onset   Arthritis Mother    Diabetes Mother    Stroke Mother    Asthma Father    Colon cancer Brother    Arthritis Daughter    Thyroid disease Daughter    Breast cancer Maternal Aunt    Social History   Occupational History   Occupation: [employer]    Comment: in [employer]   Occupation: [employer]    Comment: cafeteria and bus driver  Tobacco Use   Smoking status: Former    Current packs/day: 0.00    Types: Cigarettes    Quit date: 1994    Years since quitting: 31.2    Passive exposure: Never   Smokeless tobacco: Never  Vaping Use   Vaping status: Never Used  Substance and Sexual Activity   Alcohol use: Yes    Comment: rarely   Drug use: No   Sexual activity: Not Currently    Birth control/protection: Post-menopausal    Tobacco Counseling Counseling given: No   Immunizations and Health Maintenance Immunization History  Administered Date(s) Administered   Fluad Quad(high Dose 65+) 07/26/2020, 09/05/2021   Influenza-Unspecified 06/04/2017   PFIZER(Purple Top)SARS-COV-2 Vaccination 05/12/2020, 06/02/2020   PNEUMOCOCCAL CONJUGATE-20 09/05/2021   Tdap 03/05/2021   There are no preventive care reminders to display for this patient.   Activities of  Daily Living    11/20/2023    2:18 PM 11/19/2023   10:12 AM  In your present state of health, do you have any difficulty performing the following activities:  Hearing? 0 0  Vision? 0 0  Difficulty concentrating or making decisions? 0 0  Walking or climbing stairs? 1 1  Dressing or bathing? 0 0  Doing errands, shopping? 0 0  Preparing Food and eating ? N N  Using the Toilet? N N  In the past six months, have you accidently leaked urine? Y Y  Do you have problems with loss of bowel control? N N  Managing your Medications? N N  Managing your Finances? N N  Housekeeping or managing your Housekeeping? N N     Advanced Directives: Does Patient Have a Medical Advance Directive?:  No Would patient like information on creating a medical advance directive?: Yes (ED - Information included in AVS)      Assessment:    This is a routine wellness examination for this patient.  Vision/Hearing screen Pt declined.   Goals      DIET - INCREASE WATER INTAKE     Exercise 3x per week (30 min per time)     Have 3 meals a day       Depression Screen    11/20/2023    2:25 PM 09/04/2023    8:29 AM 05/07/2023   10:47 AM 03/11/2023    2:07 PM  PHQ 2/9 Scores  PHQ - 2 Score 0 2 1 2   PHQ- 9 Score 5 5 7 6      Fall Risk    11/20/2023    2:25 PM  Fall Risk   Falls in the past year? 1  Number falls in past yr: 1  Injury with Fall? 0  Risk for fall due to : History of fall(s)  Follow up Falls evaluation completed    Cognitive Function:    11/20/2023    2:19 PM  MMSE - Mini Mental State Exam  Orientation to time 5  Orientation to Place 5  Registration 3  Attention/ Calculation 5  Recall 2  Language- name 2 objects 2  Language- repeat 1  Language- follow 3 step command 3  Language- read & follow direction 1  Write a sentence 1  Copy design 1  Total score 29        Patient Care Team: Sonny Masters, FNP as PCP - General (Family Medicine)     Plan:   Anna Carney was seen today for medicare wellness.  Diagnoses and all orders for this visit:  Encounter for Medicare annual wellness exam   I have personally reviewed and noted the following in the patient's chart:   Medical and social history Use of alcohol, tobacco or illicit drugs  Current medications and supplements Functional ability and status Nutritional status Physical activity Advanced directives List of other physicians Hospitalizations, surgeries, and ER visits in previous 12 months Vitals Screenings to include cognitive, depression, and falls Referrals and appointments  In addition, I have reviewed and discussed with patient certain preventive protocols, quality metrics, and best practice  recommendations. A written personalized care plan for preventive services as well as general preventive health recommendations were provided to patient.     Kari Baars, [place] 11/20/2023

## 2023-12-15 ENCOUNTER — Other Ambulatory Visit: Payer: Self-pay

## 2023-12-15 DIAGNOSIS — C50512 Malignant neoplasm of lower-outer quadrant of left female breast: Secondary | ICD-10-CM

## 2023-12-15 DIAGNOSIS — M810 Age-related osteoporosis without current pathological fracture: Secondary | ICD-10-CM

## 2023-12-16 ENCOUNTER — Inpatient Hospital Stay: Payer: Medicare PPO | Attending: Hematology

## 2023-12-16 DIAGNOSIS — M810 Age-related osteoporosis without current pathological fracture: Secondary | ICD-10-CM | POA: Diagnosis not present

## 2023-12-16 DIAGNOSIS — C50512 Malignant neoplasm of lower-outer quadrant of left female breast: Secondary | ICD-10-CM

## 2023-12-16 DIAGNOSIS — Z853 Personal history of malignant neoplasm of breast: Secondary | ICD-10-CM | POA: Diagnosis not present

## 2023-12-16 LAB — CBC WITH DIFFERENTIAL/PLATELET
Abs Immature Granulocytes: 0.01 10*3/uL (ref 0.00–0.07)
Basophils Absolute: 0.1 10*3/uL (ref 0.0–0.1)
Basophils Relative: 1 %
Eosinophils Absolute: 0.3 10*3/uL (ref 0.0–0.5)
Eosinophils Relative: 5 %
HCT: 41 % (ref 36.0–46.0)
Hemoglobin: 13.5 g/dL (ref 12.0–15.0)
Immature Granulocytes: 0 %
Lymphocytes Relative: 25 %
Lymphs Abs: 1.4 10*3/uL (ref 0.7–4.0)
MCH: 32.2 pg (ref 26.0–34.0)
MCHC: 32.9 g/dL (ref 30.0–36.0)
MCV: 97.9 fL (ref 80.0–100.0)
Monocytes Absolute: 0.5 10*3/uL (ref 0.1–1.0)
Monocytes Relative: 9 %
Neutro Abs: 3.4 10*3/uL (ref 1.7–7.7)
Neutrophils Relative %: 60 %
Platelets: 152 10*3/uL (ref 150–400)
RBC: 4.19 MIL/uL (ref 3.87–5.11)
RDW: 12.9 % (ref 11.5–15.5)
WBC: 5.7 10*3/uL (ref 4.0–10.5)
nRBC: 0 % (ref 0.0–0.2)

## 2023-12-16 LAB — COMPREHENSIVE METABOLIC PANEL WITH GFR
ALT: 16 U/L (ref 0–44)
AST: 24 U/L (ref 15–41)
Albumin: 3.6 g/dL (ref 3.5–5.0)
Alkaline Phosphatase: 69 U/L (ref 38–126)
Anion gap: 6 (ref 5–15)
BUN: 16 mg/dL (ref 8–23)
CO2: 28 mmol/L (ref 22–32)
Calcium: 9 mg/dL (ref 8.9–10.3)
Chloride: 103 mmol/L (ref 98–111)
Creatinine, Ser: 0.86 mg/dL (ref 0.44–1.00)
GFR, Estimated: >60 mL/min (ref >60)
Glucose, Bld: 102 mg/dL — ABNORMAL HIGH (ref 70–99)
Potassium: 4.8 mmol/L (ref 3.5–5.1)
Sodium: 137 mmol/L (ref 135–145)
Total Bilirubin: 0.8 mg/dL (ref 0.0–1.2)
Total Protein: 6.5 g/dL (ref 6.5–8.1)

## 2023-12-16 LAB — VITAMIN D 25 HYDROXY (VIT D DEFICIENCY, FRACTURES): Vit D, 25-Hydroxy: 58.14 ng/mL (ref 30–100)

## 2024-02-18 DIAGNOSIS — M170 Bilateral primary osteoarthritis of knee: Secondary | ICD-10-CM | POA: Diagnosis not present

## 2024-03-03 ENCOUNTER — Encounter: Payer: Self-pay | Admitting: Family Medicine

## 2024-03-03 ENCOUNTER — Ambulatory Visit: Payer: Medicare PPO | Admitting: Family Medicine

## 2024-03-03 VITALS — BP 128/74 | HR 60 | Temp 97.7°F | Ht 64.0 in | Wt 236.0 lb

## 2024-03-03 DIAGNOSIS — Z6841 Body Mass Index (BMI) 40.0 and over, adult: Secondary | ICD-10-CM

## 2024-03-03 DIAGNOSIS — I10 Essential (primary) hypertension: Secondary | ICD-10-CM

## 2024-03-03 DIAGNOSIS — Z170 Estrogen receptor positive status [ER+]: Secondary | ICD-10-CM

## 2024-03-03 DIAGNOSIS — F064 Anxiety disorder due to known physiological condition: Secondary | ICD-10-CM | POA: Diagnosis not present

## 2024-03-03 DIAGNOSIS — F321 Major depressive disorder, single episode, moderate: Secondary | ICD-10-CM | POA: Diagnosis not present

## 2024-03-03 DIAGNOSIS — C50512 Malignant neoplasm of lower-outer quadrant of left female breast: Secondary | ICD-10-CM | POA: Diagnosis not present

## 2024-03-03 DIAGNOSIS — G479 Sleep disorder, unspecified: Secondary | ICD-10-CM | POA: Diagnosis not present

## 2024-03-03 MED ORDER — ESCITALOPRAM OXALATE 20 MG PO TABS: 20.0000 mg | ORAL_TABLET | Freq: Every day | ORAL | 1 refills | Status: DC

## 2024-03-03 MED ORDER — LOSARTAN POTASSIUM 50 MG PO TABS: 50.0000 mg | ORAL_TABLET | Freq: Every day | ORAL | 1 refills | Status: DC

## 2024-03-03 MED ORDER — TRAZODONE HCL 50 MG PO TABS: 25.0000 mg | ORAL_TABLET | Freq: Every evening | ORAL | 3 refills | Status: DC | PRN

## 2024-03-03 NOTE — Progress Notes (Signed)
 Subjective:  Patient ID: Anna Carney, female    DOB: [birth year], 72 y.o.   MRN: [id number]  Patient Care Team: Severa Rock HERO, FNP as PCP - General (Family Medicine)   Chief Complaint:  Medical Management of Chronic Issues (6 month chronic check up ) and trouble sleeping    HPI: Anna Carney is a 73 y.o. female presenting on 03/03/2024 for Medical Management of Chronic Issues (6 month chronic check up ) and trouble sleeping    Anna Carney is a 73 year old female who presents with insomnia and headaches following the recent passing of her husband.She has been experiencing difficulty sleeping since her husband's passing last month. Her sleep pattern is inconsistent, with some nights allowing her to fall asleep but waking frequently, while other nights she remains awake most of the night. She tried melatonin at a dose of 5 mg but discontinued it after a few days due to feeling 'drugged' upon waking.She has been experiencing headaches, with a particularly severe one occurring yesterday. She reports that she drinks fluids but does not eat much. She takes Tylenol  for her headaches.Her current medications include losartan  for blood pressure, which she takes daily, and Lexapro  for anxiety and depression, which she feels is effective. Recent lab work showed normal results, with a slightly elevated blood glucose level of 102, but no significant concerns. She is scheduled for a mammogram and blood work next week and has an upcoming appointment with her oncologist on the 30th.No chest pain or leg swelling. Blood pressure has been well controlled at home. No issues with medications.        03/03/2024    8:33 AM 11/20/2023    2:25 PM 09/04/2023    8:29 AM 05/07/2023   10:47 AM 03/11/2023    2:07 PM  Depression screen PHQ 2/9  Decreased Interest 0 0 1 1 2   Down, Depressed, Hopeless 0 0 1 0 0  PHQ - 2 Score 0 0 2 1 2   Altered sleeping 3 3 1 2  0  Tired, decreased energy 1 1 1 2 2   Change in  appetite 2 1 1 2 2   Feeling bad or failure about yourself  0 0 0 0 0  Trouble concentrating 0 0 0 0 0  Moving slowly or fidgety/restless 0 0 0 0 0  Suicidal thoughts 0 0 0 0 0  PHQ-9 Score 6 5 5 7 6   Difficult doing work/chores Not difficult at all Not difficult at all Somewhat difficult  Not difficult at all      03/03/2024    8:33 AM 11/20/2023    2:25 PM 09/04/2023    8:29 AM 05/07/2023   10:47 AM  GAD 7 : Generalized Anxiety Score  Nervous, Anxious, on Edge 1 0 0 0  Control/stop worrying 0 0 0 0  Worry too much - different things 0 0 0 0  Trouble relaxing 1 0 0 0  Restless 0 0 0 0  Easily annoyed or irritable 0 0 0 0  Afraid - awful might happen 0 0 0 0  Total GAD 7 Score 2 0 0 0  Anxiety Difficulty Not difficult at all Not difficult at all Not difficult at all          Relevant past medical, surgical, family, and social history reviewed and updated as indicated.  Allergies and medications reviewed and updated. Data reviewed: Chart in Epic.   Past Medical History:  Diagnosis Date   Anxiety  Arthritis    Cancer (HCC) 10/2018   left breast IDC   Hypertension    Shingles     Past Surgical History:  Procedure Laterality Date   CHOLECYSTECTOMY     MASTECTOMY W/ SENTINEL NODE BIOPSY Left 11/12/2018   Procedure: LEFT MASTECTOMY WITH LEFT AXILLARY SENTINEL LYMPH NODE BIOPSY;  Surgeon: Ebbie Cough, MD;  Location: [facility];  Service: General;  Laterality: Left;   WRIST SURGERY Left     Social History   Socioeconomic History   Marital status: Single    Spouse name: Not on file   Number of children: Not on file   Years of education: Not on file   Highest education level: 12th grade  Occupational History   Occupation: [employer]    Comment: in [employer]   Occupation: [employer]    Comment: cafeteria and bus driver  Tobacco Use   Smoking status: Former    Current packs/day: 0.00    Types: Cigarettes    Quit  date: 1994    Years since quitting: 31.5    Passive exposure: Never   Smokeless tobacco: Never  Vaping Use   Vaping status: Never Used  Substance and Sexual Activity   Alcohol use: Yes    Comment: rarely   Drug use: No   Sexual activity: Not Currently    Birth control/protection: Post-menopausal  Other Topics Concern   Not on file  Social History Narrative   Not on file   Social Drivers of Health   Financial Resource Strain: Low Risk  (09/03/2023)   Overall Financial Resource Strain (CARDIA)    Difficulty of Paying Living Expenses: Not hard at all  Food Insecurity: No Food Insecurity (09/03/2023)   Hunger Vital Sign    Worried About Running Out of Food in the Last Year: Never true    Ran Out of Food in the Last Year: Never true  Transportation Needs: No Transportation Needs (09/03/2023)   PRAPARE - Administrator, Civil Service (Medical): No    Lack of Transportation (Non-Medical): No  Physical Activity: Unknown (09/03/2023)   Exercise Vital Sign    Days of Exercise per Week: 0 days    Minutes of Exercise per Session: Not on file  Stress: No Stress Concern Present (09/03/2023)   [employer] of Occupational Health - Occupational Stress Questionnaire    Feeling of Stress : Only a little  Social Connections: Moderately Isolated (09/03/2023)   Social Connection and Isolation Panel    Frequency of Communication with Friends and Family: More than three times a week    Frequency of Social Gatherings with Friends and Family: Twice a week    Attends Religious Services: Never    Database administrator or Organizations: No    Attends Engineer, structural: Not on file    Marital Status: Living with partner  Intimate Partner Violence: Not At Risk (06/20/2020)   Humiliation, Afraid, Rape, and Kick questionnaire    Fear of Current or Ex-Partner: No    Emotionally Abused: No    Physically Abused: No    Sexually Abused: No    Outpatient Encounter Medications as  of 03/03/2024  Medication Sig   acetaminophen  (TYLENOL ) 325 MG tablet Take 650 mg by mouth every 6 (six) hours as needed.   Ascorbic Acid (VITAMIN C) 1000 MG tablet Take 2,000 mg by mouth daily.   calcium carbonate (OS-CAL) 1250 (500 Ca) MG chewable tablet Chew 1 tablet  by mouth 2 (two) times daily.    cholecalciferol (VITAMIN D3) 25 MCG (1000 UT) tablet Take 2,000 Units by mouth daily.   Cyanocobalamin ([employer] B12 GUMMIES) 500 MCG CHEW Chew by mouth.   Docusate Calcium (STOOL SOFTENER PO) Take by mouth.   Multiple Vitamin (MULTIVITAMIN) tablet Take 1 tablet by mouth daily.   traZODone  (DESYREL ) 50 MG tablet Take 0.5-1 tablets (25-50 mg total) by mouth at bedtime as needed for sleep.   [DISCONTINUED] escitalopram  (LEXAPRO ) 20 MG tablet Take 1 tablet (20 mg total) by mouth daily.   [DISCONTINUED] losartan  (COZAAR ) 50 MG tablet Take 1 tablet (50 mg total) by mouth daily.   escitalopram  (LEXAPRO ) 20 MG tablet Take 1 tablet (20 mg total) by mouth daily.   losartan  (COZAAR ) 50 MG tablet Take 1 tablet (50 mg total) by mouth daily.   No facility-administered encounter medications on file as of 03/03/2024.    Allergies  Allergen Reactions   Sulfa  Antibiotics Nausea Only and Other (See Comments)    Pertinent ROS per HPI, otherwise unremarkable      Objective:  BP 128/74 (BP Location: Left Arm, Cuff Size: Normal)   Pulse 60   Temp 97.7 F (36.5 C)   Ht 5' 4 (1.626 m)   Wt 236 lb (107 kg)   SpO2 95%   BMI 40.51 kg/m    Wt Readings from Last 3 Encounters:  03/03/24 236 lb (107 kg)  11/20/23 242 lb 3.2 oz (109.9 kg)  09/18/23 235 lb (106.6 kg)    Physical Exam Vitals and nursing note reviewed.  Constitutional:      General: She is not in acute distress.    Appearance: Normal appearance. She is morbidly obese. She is not ill-appearing, toxic-appearing or diaphoretic.  HENT:     Head: Normocephalic and atraumatic.     Nose: Nose normal.     Mouth/Throat:     Mouth: Mucous membranes  are moist.  Eyes:     Conjunctiva/sclera: Conjunctivae normal.     Pupils: Pupils are equal, round, and reactive to light.  Cardiovascular:     Rate and Rhythm: Normal rate and regular rhythm.     Heart sounds: Normal heart sounds.  Pulmonary:     Effort: Pulmonary effort is normal.     Breath sounds: Normal breath sounds.  Musculoskeletal:     Right lower leg: No edema.     Left lower leg: No edema.  Skin:    General: Skin is warm and dry.     Capillary Refill: Capillary refill takes less than 2 seconds.  Neurological:     General: No focal deficit present.     Mental Status: She is alert and oriented to person, place, and time.  Psychiatric:        Mood and Affect: Mood normal.        Behavior: Behavior normal. Behavior is cooperative.        Thought Content: Thought content normal.        Judgment: Judgment normal.    Physical Exam            Results for orders placed or performed in visit on 12/16/23  CBC with Differential   Collection Time: 12/16/23  7:46 AM  Result Value Ref Range   WBC 5.7 4.0 - 10.5 K/uL   RBC 4.19 3.87 - 5.11 MIL/uL   Hemoglobin 13.5 12.0 - 15.0 g/dL   HCT 58.9 63.9 - 53.9 %   MCV 97.9 80.0 - 100.0  fL   MCH 32.2 26.0 - 34.0 pg   MCHC 32.9 30.0 - 36.0 g/dL   RDW 87.0 88.4 - 84.4 %   Platelets 152 150 - 400 K/uL   nRBC 0.0 0.0 - 0.2 %   Neutrophils Relative % 60 %   Neutro Abs 3.4 1.7 - 7.7 K/uL   Lymphocytes Relative 25 %   Lymphs Abs 1.4 0.7 - 4.0 K/uL   Monocytes Relative 9 %   Monocytes Absolute 0.5 0.1 - 1.0 K/uL   Eosinophils Relative 5 %   Eosinophils Absolute 0.3 0.0 - 0.5 K/uL   Basophils Relative 1 %   Basophils Absolute 0.1 0.0 - 0.1 K/uL   Immature Granulocytes 0 %   Abs Immature Granulocytes 0.01 0.00 - 0.07 K/uL  Comprehensive metabolic panel   Collection Time: 12/16/23  7:46 AM  Result Value Ref Range   Sodium 137 135 - 145 mmol/L   Potassium 4.8 3.5 - 5.1 mmol/L   Chloride 103 98 - 111 mmol/L   CO2 28 22 - 32  mmol/L   Glucose, Bld 102 (H) 70 - 99 mg/dL   BUN 16 8 - 23 mg/dL   Creatinine, Ser 9.13 0.44 - 1.00 mg/dL   Calcium 9.0 8.9 - 89.6 mg/dL   Total Protein 6.5 6.5 - 8.1 g/dL   Albumin 3.6 3.5 - 5.0 g/dL   AST 24 15 - 41 U/L   ALT 16 0 - 44 U/L   Alkaline Phosphatase 69 38 - 126 U/L   Total Bilirubin 0.8 0.0 - 1.2 mg/dL   GFR, Estimated >39 >39 mL/min   Anion gap 6 5 - 15  Vitamin D  25 hydroxy   Collection Time: 12/16/23  7:46 AM  Result Value Ref Range   Vit D, 25-Hydroxy 58.14 30 - 100 ng/mL       Pertinent labs & imaging results that were available during my care of the patient were reviewed by me and considered in my medical decision making.  Assessment & Plan:  Kiira was seen today for medical management of chronic issues and trouble sleeping .  Diagnoses and all orders for this visit:  Depression, major, single episode, moderate (HCC) -     escitalopram  (LEXAPRO ) 20 MG tablet; Take 1 tablet (20 mg total) by mouth daily.  Malignant neoplasm of lower-outer quadrant of left breast of female, estrogen receptor positive (HCC) Followed by oncology.   Morbid obesity (HCC) Diet and exercise encouraged.   Anxiety disorder due to medical condition -     escitalopram  (LEXAPRO ) 20 MG tablet; Take 1 tablet (20 mg total) by mouth daily.  Essential hypertension -     losartan  (COZAAR ) 50 MG tablet; Take 1 tablet (50 mg total) by mouth daily.  Sleep trouble -     traZODone  (DESYREL ) 50 MG tablet; Take 0.5-1 tablets (25-50 mg total) by mouth at bedtime as needed for sleep.     InsomniaExperiencing difficulty with sleep initiation and maintenance, exacerbated by bereavement. Melatonin 5 mg caused morning grogginess, leading to discontinuation. Insomnia likely contributing to headaches. Trazodone  50 mg prescribed, starting with 25 mg to minimize side effects. Advised to ensure 6-8 hours of sleep to prevent hangover effects. Cautioned about nighttime drowsiness and fall risk.-  Prescribe trazodone  50 mg tablets, starting with 25 mg for sleep. Increase to 50 mg if needed.- Advise 6-8 hours of sleep to prevent hangover effects.- Caution about fall risk due to nighttime drowsiness.HeadachesExperiencing headaches, with a severe episode yesterday. Contributing factors  include stress, insomnia, and possible dehydration. Blood pressure is well-controlled, reducing its likelihood as a factor. Recommended Tylenol  and Excedrin Tension, avoiding Excedrin Migraine due to aspirin.- Recommend Tylenol  for headache relief.- Suggest Excedrin Tension, but avoid Excedrin Migraine.General Health MaintenanceRecent oncology labs showed normal thyroid  function and cholesterol. Blood glucose slightly elevated at 102 mg/dL. Scheduled for a mammogram and blood work next week.- Proceed with scheduled mammogram and blood work next week.Follow-upScheduled to see oncologist, Dr. Geofm, on July 30. Follow-up with primary care depends on trazodone 's effectiveness. If effective, continue and request refills; if ineffective, return sooner than six months.- Follow up with primary care sooner than six months if trazodone  is ineffective.- If trazodone  is effective, request refills and continue use.          Continue all other maintenance medications.  Follow up plan: Return in about 6 months (around 09/03/2024), or if symptoms worsen or fail to improve, for Annual Physical.   Continue healthy lifestyle choices, including diet (rich in fruits, vegetables, and lean proteins, and low in salt and simple carbohydrates) and exercise (at least 30 minutes of moderate physical activity daily).  Educational handout given for DASH diet, insomnia   The above assessment and management plan was discussed with the patient. The patient verbalized understanding of and has agreed to the management plan. Patient is aware to call the clinic if they develop any new symptoms or if symptoms persist or worsen. Patient is aware when to  return to the clinic for a follow-up visit. Patient educated on when it is appropriate to go to the emergency department.   Rosaline Bruns, FNP-C [facility] [phone]

## 2024-03-03 NOTE — Patient Instructions (Signed)
 Goal BP:  For patients younger than 60: Goal BP < 140/90. For patients 60 and older: Goal BP < 150/90. For patients with diabetes: Goal BP < 140/90.  Take your medications faithfully as prescribed. Maintain a healthy weight. Get at least 150 minutes of aerobic exercise per week. Minimize salt intake, less than 2000 mg per day. Minimize alcohol intake.  DASH Eating Plan DASH stands for "Dietary Approaches to Stop Hypertension." The DASH eating plan is a healthy eating plan that has been shown to reduce high blood pressure (hypertension). Additional health benefits may include reducing the risk of type 2 diabetes mellitus, heart disease, and stroke. The DASH eating plan may also help with weight loss.  WHAT DO I NEED TO KNOW ABOUT THE DASH EATING PLAN? For the DASH eating plan, you will follow these general guidelines: Choose foods with a percent daily value for sodium of less than 5% (as listed on the food label). Use salt-free seasonings or herbs instead of table salt or sea salt. Check with your health care provider or pharmacist before using salt substitutes. Eat lower-sodium products, often labeled as "lower sodium" or "no salt added." Eat fresh foods. Eat more vegetables, fruits, and low-fat dairy products. Choose whole grains. Look for the word "whole" as the first word in the ingredient list. Choose fish and skinless chicken or [place] more often than red meat. Limit fish, poultry, and meat to 6 oz (170 g) each day. Limit sweets, desserts, sugars, and sugary drinks. Choose heart-healthy fats. Limit cheese to 1 oz (28 g) per day. Eat more home-cooked food and less restaurant, buffet, and fast food. Limit fried foods. Cook foods using methods other than frying. Limit canned vegetables. If you do use them, rinse them well to decrease the sodium. When eating at a restaurant, ask that your food be prepared with less salt, or no salt if possible.  WHAT FOODS CAN I EAT? Seek help from  a dietitian for individual calorie needs.  Grains Whole grain or whole wheat bread. Brown rice. Whole grain or whole wheat pasta. Quinoa, bulgur, and whole grain cereals. Low-sodium cereals. Corn or whole wheat flour tortillas. Whole grain cornbread. Whole grain crackers. Low-sodium crackers.  Vegetables Fresh or frozen vegetables (raw, steamed, roasted, or grilled). Low-sodium or reduced-sodium tomato and vegetable juices. Low-sodium or reduced-sodium tomato sauce and paste. Low-sodium or reduced-sodium canned vegetables.   Fruits All fresh, canned (in natural juice), or frozen fruits.  Meat and Other Protein Products Ground beef (85% or leaner), grass-fed beef, or beef trimmed of fat. Skinless chicken or [place]. Ground chicken or [place]. Pork trimmed of fat. All fish and seafood. Eggs. Dried beans, peas, or lentils. Unsalted nuts and seeds. Unsalted canned beans.  Dairy Low-fat dairy products, such as skim or 1% milk, 2% or reduced-fat cheeses, low-fat ricotta or cottage cheese, or plain low-fat yogurt. Low-sodium or reduced-sodium cheeses.  Fats and Oils Tub margarines without trans fats. Light or reduced-fat mayonnaise and salad dressings (reduced sodium). Avocado. Safflower, olive, or canola oils. Natural peanut or almond butter.  Other Unsalted popcorn and pretzels. The items listed above may not be a complete list of recommended foods or beverages. Contact your dietitian for more options.  WHAT FOODS ARE NOT RECOMMENDED?  Grains White bread. White pasta. White rice. Refined cornbread. Bagels and croissants. Crackers that contain trans fat.  Vegetables Creamed or fried vegetables. Vegetables in a cheese sauce. Regular canned vegetables. Regular canned tomato sauce and paste. Regular tomato and vegetable juices.  Fruits Dried fruits. Canned fruit in light or heavy syrup. Fruit juice.  Meat and Other Protein Products Fatty cuts of meat. Ribs, chicken wings, bacon, sausage,  bologna, salami, chitterlings, fatback, hot dogs, bratwurst, and packaged luncheon meats. Salted nuts and seeds. Canned beans with salt.  Dairy Whole or 2% milk, cream, half-and-half, and cream cheese. Whole-fat or sweetened yogurt. Full-fat cheeses or blue cheese. Nondairy creamers and whipped toppings. Processed cheese, cheese spreads, or cheese curds.  Condiments Onion and garlic salt, seasoned salt, table salt, and sea salt. Canned and packaged gravies. Worcestershire sauce. Tartar sauce. Barbecue sauce. Teriyaki sauce. Soy sauce, including reduced sodium. Steak sauce. Fish sauce. Oyster sauce. Cocktail sauce. Horseradish. Ketchup and mustard. Meat flavorings and tenderizers. Bouillon cubes. Hot sauce. Tabasco sauce. Marinades. Taco seasonings. Relishes.  Fats and Oils Butter, stick margarine, lard, shortening, ghee, and bacon fat. Coconut, palm kernel, or palm oils. Regular salad dressings.  Other Pickles and olives. Salted popcorn and pretzels.  The items listed above may not be a complete list of foods and beverages to avoid. Contact your dietitian for more information.  WHERE CAN I FIND MORE INFORMATION? National Heart, Lung, and Blood Institute: [employer] Document Released: 07/25/2011 Document Revised: 12/20/2013 Document Reviewed: 06/09/2013 [facility] Patient Information 2015 [place], [place]. This information is not intended to replace advice given to you by your health care provider. Make sure you discuss any questions you have with your health care provider.   I think that you would greatly benefit from seeing a nutritionist.  If you are interested, please call Dr. Gerilyn Pilgrim at [phone] to schedule an appointment.

## 2024-03-11 ENCOUNTER — Inpatient Hospital Stay: Payer: Medicare PPO | Attending: Hematology

## 2024-03-11 ENCOUNTER — Ambulatory Visit (HOSPITAL_COMMUNITY)
Admission: RE | Admit: 2024-03-11 | Discharge: 2024-03-11 | Disposition: A | Discharge status: Home / Self Care | Payer: Medicare PPO | Source: Ambulatory Visit | Attending: Oncology | Admitting: Oncology

## 2024-03-11 DIAGNOSIS — Z1231 Encounter for screening mammogram for malignant neoplasm of breast: Secondary | ICD-10-CM | POA: Diagnosis not present

## 2024-03-11 DIAGNOSIS — Z853 Personal history of malignant neoplasm of breast: Secondary | ICD-10-CM | POA: Diagnosis not present

## 2024-03-11 DIAGNOSIS — Z08 Encounter for follow-up examination after completed treatment for malignant neoplasm: Secondary | ICD-10-CM | POA: Diagnosis not present

## 2024-03-11 DIAGNOSIS — Z170 Estrogen receptor positive status [ER+]: Secondary | ICD-10-CM | POA: Diagnosis not present

## 2024-03-11 DIAGNOSIS — M810 Age-related osteoporosis without current pathological fracture: Secondary | ICD-10-CM | POA: Diagnosis not present

## 2024-03-11 DIAGNOSIS — C50512 Malignant neoplasm of lower-outer quadrant of left female breast: Secondary | ICD-10-CM | POA: Insufficient documentation

## 2024-03-11 LAB — CBC WITH DIFFERENTIAL/PLATELET
Abs Immature Granulocytes: 0.01 K/uL (ref 0.00–0.07)
Basophils Absolute: 0 K/uL (ref 0.0–0.1)
Basophils Relative: 1 %
Eosinophils Absolute: 0.3 K/uL (ref 0.0–0.5)
Eosinophils Relative: 4 %
HCT: 41.5 % (ref 36.0–46.0)
Hemoglobin: 13.6 g/dL (ref 12.0–15.0)
Immature Granulocytes: 0 %
Lymphocytes Relative: 25 %
Lymphs Abs: 1.5 K/uL (ref 0.7–4.0)
MCH: 32.5 pg (ref 26.0–34.0)
MCHC: 32.8 g/dL (ref 30.0–36.0)
MCV: 99 fL (ref 80.0–100.0)
Monocytes Absolute: 0.5 K/uL (ref 0.1–1.0)
Monocytes Relative: 8 %
Neutro Abs: 3.7 K/uL (ref 1.7–7.7)
Neutrophils Relative %: 62 %
Platelets: 154 K/uL (ref 150–400)
RBC: 4.19 MIL/uL (ref 3.87–5.11)
RDW: 12.6 % (ref 11.5–15.5)
WBC: 6 K/uL (ref 4.0–10.5)
nRBC: 0 % (ref 0.0–0.2)

## 2024-03-11 LAB — COMPREHENSIVE METABOLIC PANEL WITH GFR
ALT: 18 U/L (ref 0–44)
AST: 24 U/L (ref 15–41)
Albumin: 3.7 g/dL (ref 3.5–5.0)
Alkaline Phosphatase: 73 U/L (ref 38–126)
Anion gap: 7 (ref 5–15)
BUN: 19 mg/dL (ref 8–23)
CO2: 27 mmol/L (ref 22–32)
Calcium: 8.6 mg/dL — ABNORMAL LOW (ref 8.9–10.3)
Chloride: 102 mmol/L (ref 98–111)
Creatinine, Ser: 0.79 mg/dL (ref 0.44–1.00)
GFR, Estimated: >60 mL/min (ref >60)
Glucose, Bld: 101 mg/dL — ABNORMAL HIGH (ref 70–99)
Potassium: 3.7 mmol/L (ref 3.5–5.1)
Sodium: 136 mmol/L (ref 135–145)
Total Bilirubin: 1.1 mg/dL (ref 0.0–1.2)
Total Protein: 7 g/dL (ref 6.5–8.1)

## 2024-03-18 ENCOUNTER — Inpatient Hospital Stay: Payer: Medicare PPO | Admitting: Oncology

## 2024-03-18 VITALS — BP 135/83 | HR 69 | Temp 98.8°F | Resp 18 | Wt 239.2 lb

## 2024-03-18 DIAGNOSIS — C50512 Malignant neoplasm of lower-outer quadrant of left female breast: Secondary | ICD-10-CM

## 2024-03-18 DIAGNOSIS — M810 Age-related osteoporosis without current pathological fracture: Secondary | ICD-10-CM

## 2024-03-18 DIAGNOSIS — Z170 Estrogen receptor positive status [ER+]: Secondary | ICD-10-CM

## 2024-03-18 DIAGNOSIS — Z853 Personal history of malignant neoplasm of breast: Secondary | ICD-10-CM | POA: Diagnosis not present

## 2024-03-18 DIAGNOSIS — Z08 Encounter for follow-up examination after completed treatment for malignant neoplasm: Secondary | ICD-10-CM | POA: Diagnosis not present

## 2024-03-18 NOTE — Progress Notes (Unsigned)
 [facility] [address] [place] [address]    Clinic Day:  03/19/24   Referring physician: Severa Rock HERO, FNP  Patient Care Team: Severa Rock HERO, FNP as PCP - General (Family Medicine)   ASSESSMENT & PLAN:   Assessment:  1.  Stage IIIb (T4N0) left breast IDC, ER/PR positive, HER-2 negative: -Biopsy of the left breast on 10/20/2018 showed IDC.  Left axillary core biopsy was negative. -Left mastectomy on 11/12/2018, 11 cm IDC, grade 2, margins negative, 0/14 lymph node positive, ER/PR positive, HER-2 2+ by IHC and negative by FISH, Ki-67 10%. -Oncotype DX recurrence score of 2. -Anastrozole  started on 12/10/2018. -She was switched to tamoxifen  in July 2024 due to osteoporosis -XRT from 02/01/2019-03/18/2019. -Right breast mammogram on 12/16/2019 shows BI-RADS 1.   2.  Osteopenia: -DEXA scan on 02/09/2019 shows T score of -2. 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    Plan:  1.  Stage IIIb (T4N0) left breast IDC, ER/PR positive, HER-2 negative: - Labs: Normal LFTs and creatinine.  CBC grossly normal. - Right breast mammogram (03/10/2023): BI-RADS Category 1.  Repeat mammogram in July 2025. - She was switched from anastrozole  to tamoxifen  due to worsening bone health back in July 2024.  Tamoxifen  was stopped back in January 2025 due to frequent falls.  We discussed BCI testing but given her tumor type, it was rejected.  She was scheduled to stop tamoxifen  in March 2025. - She is currently not on any type of antihormone medication. -Recommend continued close monitoring with annual mammograms and every 22-month breast exam.  Given, she is no longer on an antihormone medication, she can be released back to her PCP after her next visit.  2.  Osteoporosis: - Secondary to aromatase inhibitor.  She was switched to tamoxifen  July 2024 due to worsening bone health with a T-score of -2.8. -She was unable to tolerate tamoxifen  due to frequent falling.  She stopped this  in January 2025. -Recommend calcium and vitamin D  and weightbearing exercises. -Recheck her bone density in 2 years which would be July 2026.   3.  Wrist fractures: -Fractured her left wrist in October from a fall and right wrist in January 2025 from a fall.  Reports she feels off balance since she started taking tamoxifen . - No additional falls since she has stopped tamoxifen .  Breast Cancer therapy associated bone loss: I have recommended calcium, Vitamin D  and weight bearing exercises.  Orders Placed This Encounter  Procedures   Comprehensive metabolic panel    Standing Status:   Future    Expected Date:   09/18/2024    Expiration Date:   03/18/2025    Remote health to draw?:   Yes   CBC with Differential    Standing Status:   Future    Expected Date:   09/18/2024    Expiration Date:   03/18/2025   I spent 25 minutes dedicated to the care of this patient (face-to-face and non-face-to-face) on the date of the encounter to include what is described in the assessment and plan.    Delon FORBES Hope, NP   8/1/20257:42 AM  CHIEF COMPLAINT:   Diagnosis: Malignant neoplasm of lower-outer quadrant of left breast of female, estrogen receptor positive    Cancer Staging  Malignant neoplasm of lower-outer quadrant of left breast of female, estrogen receptor positive (HCC) Staging form: Breast, AJCC 8th Edition - Clinical stage from 12/10/2018: Stage IIIB (cT4b, cN0, cM0, G2, ER+, PR+, HER2-,  Oncotype DX score: 2) - Signed by Rogers Hai, MD on 12/10/2018 - Pathologic: Stage IIIA (pT4b, pN0, cM0, G2, ER+, PR+, HER2-, Oncotype DX score: 2) - Unsigned    Prior Therapy: Left mastectomy on 11/12/2018  Current Therapy:  Anastrozole  1mg  daily/tamoxifen  20 mg daily-completed in January 2025.    HISTORY OF PRESENT ILLNESS:   Oncology History  Malignant neoplasm of lower-outer quadrant of left breast of female, estrogen receptor positive (HCC)  10/27/2018 Initial Diagnosis   Malignant  neoplasm of lower-outer quadrant of left breast of female, estrogen receptor positive (HCC)   12/10/2018 Cancer Staging   Staging form: Breast, AJCC 8th Edition - Clinical stage from 12/10/2018: Stage IIIB (cT4b, cN0, cM0, G2, ER+, PR+, HER2-, Oncotype DX score: 2) - Signed by Rogers Hai, MD on 12/10/2018      INTERVAL HISTORY:   Anna Carney is a 73 y.o. female presenting to clinic today for follow up of left breast cancer.   She is currently not taking any antihormone medication.  She originally started with anastrozole  and took this for 4 years but was switched to tamoxifen  in July 2024 due to worsening bone health.  Bone density scan from July 2024 showed T-score of -2.8.  After switching to tamoxifen , she began following and feeling off balance.  She unfortunately, broke both her wrists during this time.  Ultimately, she stopped tamoxifen  in January 2025.  We discussed BCI testing to see if she would benefit from extended antihormone medication but given tumor type, it was rejected.  She reports no additional falls since coming off of tamoxifen  in January.  Reports she is doing pretty good.  She has pain in her right knee has an occasional headache and has trouble falling and staying asleep.  Appetite and energy levels are 50% which is normal for her.  She recently had a mammogram on 03/11/2024 which was read as BI-RADS Category 1 negative.  Repeat in 1 year.  She denies any new lumps or bumps.  PAST MEDICAL HISTORY:   Past Medical History: 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   Hypertension    Shingles     Surgical History: Past Surgical History:  Procedure Laterality Date   CHOLECYSTECTOMY     MASTECTOMY W/ SENTINEL NODE BIOPSY Left 11/12/2018   Procedure: LEFT MASTECTOMY WITH LEFT AXILLARY SENTINEL LYMPH NODE BIOPSY;  Surgeon: Ebbie Cough, MD;  Location: [facility];  Service:  General;  Laterality: Left;   WRIST SURGERY Left     Social History: Social History   Socioeconomic History   Marital status: Single    Spouse name: Not on file   Number of children: Not on file   Years of education: Not on file   Highest education level: 12th grade  Occupational History   Occupation: [employer]    Comment: in garden center   Occupation: [employer]    Comment: cafeteria and bus driver  Tobacco Use   Smoking status: Former    Current packs/day: 0.00    Types: Cigarettes    Quit date: 1994    Years since quitting: 31.6    Passive exposure: Never   Smokeless tobacco: Never  Vaping Use   Vaping status: Never Used  Substance and Sexual Activity   Alcohol use: Yes    Comment: rarely   Drug use: No   Sexual activity: Not Currently  Birth control/protection: Post-menopausal  Other Topics Concern   Not on file  Social History Narrative   Not on file   Social Drivers of Health   Financial Resource Strain: Low Risk  (09/03/2023)   Overall Financial Resource Strain (CARDIA)    Difficulty of Paying Living Expenses: Not hard at all  Food Insecurity: No Food Insecurity (09/03/2023)   Hunger Vital Sign    Worried About Running Out of Food in the Last Year: Never true    Ran Out of Food in the Last Year: Never true  Transportation Needs: No Transportation Needs (09/03/2023)   PRAPARE - Administrator, Civil Service (Medical): No    Lack of Transportation (Non-Medical): No  Physical Activity: Unknown (09/03/2023)   Exercise Vital Sign    Days of Exercise per Week: 0 days    Minutes of Exercise per Session: Not on file  Stress: No Stress Concern Present (09/03/2023)   [employer] of Occupational Health - Occupational Stress Questionnaire    Feeling of Stress : Only a little  Social Connections: Moderately Isolated (09/03/2023)   Social Connection and Isolation Panel    Frequency of Communication with Friends and Family:  More than three times a week    Frequency of Social Gatherings with Friends and Family: Twice a week    Attends Religious Services: Never    Database administrator or Organizations: No    Attends Engineer, structural: Not on file    Marital Status: Living with partner  Intimate Partner Violence: Not At Risk (06/20/2020)   Humiliation, Afraid, Rape, and Kick questionnaire    Fear of Current or Ex-Partner: No    Emotionally Abused: No    Physically Abused: No    Sexually Abused: No    Family History: Family History  Problem Relation Age of Onset   Arthritis Mother    Diabetes Mother    Stroke Mother    Asthma Father    Colon cancer Brother    Arthritis Daughter    Thyroid  disease Daughter    Breast cancer Maternal Aunt     Current Medications:  Current Outpatient Medications:    acetaminophen  (TYLENOL ) 325 MG tablet, Take 650 mg by mouth every 6 (six) hours as needed., Disp: , Rfl:    Ascorbic Acid (VITAMIN C) 1000 MG tablet, Take 2,000 mg by mouth daily., Disp: , Rfl:    calcium carbonate (OS-CAL) 1250 (500 Ca) MG chewable tablet, Chew 1 tablet by mouth 2 (two) times daily. , Disp: , Rfl:    cholecalciferol (VITAMIN D3) 25 MCG (1000 UT) tablet, Take 2,000 Units by mouth daily., Disp: , Rfl:    Cyanocobalamin ([employer] B12 GUMMIES) 500 MCG CHEW, Chew by mouth., Disp: , Rfl:    Docusate Calcium (STOOL SOFTENER PO), Take by mouth., Disp: , Rfl:    escitalopram  (LEXAPRO ) 20 MG tablet, Take 1 tablet (20 mg total) by mouth daily., Disp: 90 tablet, Rfl: 1   losartan  (COZAAR ) 50 MG tablet, Take 1 tablet (50 mg total) by mouth daily., Disp: 90 tablet, Rfl: 1   Multiple Vitamin (MULTIVITAMIN) tablet, Take 1 tablet by mouth daily., Disp: , Rfl:    traZODone  (DESYREL ) 50 MG tablet, Take 0.5-1 tablets (25-50 mg total) by mouth at bedtime as needed for sleep., Disp: 30 tablet, Rfl: 3   Allergies: Allergies  Allergen Reactions   Sulfa  Antibiotics Nausea Only and Other (See Comments)     REVIEW OF SYSTEMS:   Review of  Systems  Constitutional:  Positive for fatigue.  Neurological:  Positive for dizziness and headaches.  Psychiatric/Behavioral:  Positive for sleep disturbance.      VITALS:   Blood pressure 135/83, pulse 69, temperature 98.8 F (37.1 C), temperature source Oral, resp. rate 18, weight 239 lb 3.2 oz (108.5 kg), SpO2 95%.  Wt Readings from Last 3 Encounters:  03/18/24 239 lb 3.2 oz (108.5 kg)  03/03/24 236 lb (107 kg)  11/20/23 242 lb 3.2 oz (109.9 kg)    Body mass index is 41.06 kg/m. Breast Exam Chaperone: Isaiah Piety, RN  Performance status (ECOG): 1 - Symptomatic but completely ambulatory  PHYSICAL EXAM:   Physical Exam Constitutional:      Appearance: Normal appearance.  Cardiovascular:     Rate and Rhythm: Normal rate and regular rhythm.  Pulmonary:     Effort: Pulmonary effort is normal.     Breath sounds: Normal breath sounds.  Abdominal:     General: Bowel sounds are normal.     Palpations: Abdomen is soft.  Musculoskeletal:        General: No swelling. Normal range of motion.  Neurological:     Mental Status: She is alert and oriented to person, place, and time. Mental status is at baseline.     LABS:      Latest Ref Rng & Units 03/11/2024   10:13 AM 12/16/2023    7:46 AM 09/11/2023    1:03 PM  CBC  WBC 4.0 - 10.5 K/uL 6.0  5.7  6.4   Hemoglobin 12.0 - 15.0 g/dL 86.3  86.4  86.2   Hematocrit 36.0 - 46.0 % 41.5  41.0  42.4   Platelets 150 - 400 K/uL 154  152  142       Latest Ref Rng & Units 03/11/2024   10:13 AM 12/16/2023    7:46 AM 09/11/2023    1:03 PM  CMP  Glucose 70 - 99 mg/dL 898  897  899   BUN 8 - 23 mg/dL 19  16  15    Creatinine 0.44 - 1.00 mg/dL 9.20  9.13  9.26   Sodium 135 - 145 mmol/L 136  137  139   Potassium 3.5 - 5.1 mmol/L 3.7  4.8  3.7   Chloride 98 - 111 mmol/L 102  103  104   CO2 22 - 32 mmol/L 27  28  24    Calcium 8.9 - 10.3 mg/dL 8.6  9.0  8.8   Total Protein 6.5 - 8.1 g/dL 7.0  6.5   6.9   Total Bilirubin 0.0 - 1.2 mg/dL 1.1  0.8  0.8   Alkaline Phos 38 - 126 U/L 73  69  63   AST 15 - 41 U/L 24  24  26    ALT 0 - 44 U/L 18  16  17       No results found for: CEA1, CEA / No results found for: CEA1, CEA No results found for: PSA1 No results found for: CAN199 No results found for: CAN125  No results found for: STEPHANY RINGS, A1GS, A2GS, BETS, BETA2SER, GAMS, MSPIKE, SPEI Lab Results  Component Value Date   TIBC 272 09/04/2023   TIBC 334 03/10/2023   FERRITIN 360 (H) 09/04/2023   FERRITIN 162 03/10/2023   IRONPCTSAT 36 09/04/2023   IRONPCTSAT 30 03/10/2023   No results found for: LDH   STUDIES:   MM 3D SCREENING MAMMOGRAM UNILATERAL RIGHT BREAST Result Date: 03/15/2024 CLINICAL DATA:  Screening. EXAM: DIGITAL SCREENING  UNILATERAL RIGHT MAMMOGRAM WITH CAD AND TOMOSYNTHESIS TECHNIQUE: Right screening digital craniocaudal and mediolateral oblique mammograms were obtained. Right screening digital breast tomosynthesis was performed. The images were evaluated with computer-aided detection. COMPARISON:  Previous exam(s). ACR Breast Density Category c: The breasts are heterogeneously dense, which may obscure small masses. FINDINGS: There are no findings suspicious for malignancy. IMPRESSION: No mammographic evidence of malignancy. A result letter of this screening mammogram will be mailed directly to the patient. RECOMMENDATION: Screening mammogram in one year. (Code:SM-B-01Y) BI-RADS CATEGORY  1: Negative. Electronically Signed   By: Reyes Phi M.D.   On: 03/15/2024 15:45

## 2024-06-10 DIAGNOSIS — M170 Bilateral primary osteoarthritis of knee: Secondary | ICD-10-CM | POA: Diagnosis not present

## 2024-08-30 ENCOUNTER — Other Ambulatory Visit: Payer: Self-pay | Admitting: Family Medicine

## 2024-08-30 DIAGNOSIS — I10 Essential (primary) hypertension: Secondary | ICD-10-CM

## 2024-08-30 DIAGNOSIS — F064 Anxiety disorder due to known physiological condition: Secondary | ICD-10-CM

## 2024-08-30 DIAGNOSIS — F321 Major depressive disorder, single episode, moderate: Secondary | ICD-10-CM

## 2024-09-03 ENCOUNTER — Encounter: Payer: Self-pay | Admitting: Family Medicine

## 2024-09-03 ENCOUNTER — Ambulatory Visit: Payer: Self-pay | Admitting: Family Medicine

## 2024-09-03 VITALS — BP 148/88 | HR 62 | Temp 97.0°F | Ht 64.0 in | Wt 248.4 lb

## 2024-09-03 DIAGNOSIS — M810 Age-related osteoporosis without current pathological fracture: Secondary | ICD-10-CM

## 2024-09-03 DIAGNOSIS — Z170 Estrogen receptor positive status [ER+]: Secondary | ICD-10-CM

## 2024-09-03 DIAGNOSIS — E559 Vitamin D deficiency, unspecified: Secondary | ICD-10-CM

## 2024-09-03 DIAGNOSIS — Z0001 Encounter for general adult medical examination with abnormal findings: Secondary | ICD-10-CM

## 2024-09-03 DIAGNOSIS — F321 Major depressive disorder, single episode, moderate: Secondary | ICD-10-CM | POA: Diagnosis not present

## 2024-09-03 DIAGNOSIS — F419 Anxiety disorder, unspecified: Secondary | ICD-10-CM | POA: Diagnosis not present

## 2024-09-03 DIAGNOSIS — G479 Sleep disorder, unspecified: Secondary | ICD-10-CM

## 2024-09-03 DIAGNOSIS — I10 Essential (primary) hypertension: Secondary | ICD-10-CM | POA: Diagnosis not present

## 2024-09-03 DIAGNOSIS — Z1211 Encounter for screening for malignant neoplasm of colon: Secondary | ICD-10-CM

## 2024-09-03 DIAGNOSIS — Z1159 Encounter for screening for other viral diseases: Secondary | ICD-10-CM

## 2024-09-03 DIAGNOSIS — C50512 Malignant neoplasm of lower-outer quadrant of left female breast: Secondary | ICD-10-CM

## 2024-09-03 DIAGNOSIS — E538 Deficiency of other specified B group vitamins: Secondary | ICD-10-CM | POA: Diagnosis not present

## 2024-09-03 DIAGNOSIS — Z0000 Encounter for general adult medical examination without abnormal findings: Secondary | ICD-10-CM

## 2024-09-03 LAB — LIPID PANEL

## 2024-09-03 LAB — BAYER DCA HB A1C WAIVED: HB A1C (BAYER DCA - WAIVED): 5 % (ref 4.8–5.6)

## 2024-09-03 MED ORDER — LOSARTAN POTASSIUM 50 MG PO TABS: 50.0000 mg | ORAL_TABLET | Freq: Every day | ORAL | 2 refills | Status: AC

## 2024-09-03 MED ORDER — TRAZODONE HCL 50 MG PO TABS: 25.0000 mg | ORAL_TABLET | Freq: Every evening | ORAL | 1 refills | Status: AC | PRN

## 2024-09-03 NOTE — Patient Instructions (Addendum)
 Your provider wants you to schedule an appointment with a Psychologist/Psychiatrist. The following list of offices requires the patient to call and make their own appointment, as there is information they need that only you can provide. Please feel free to choose form the following providers:  [facility]   [phone] Crisis Recovery in [place] [phone]  [facility]  [phone] [place], [place]  (Scheduled through [facility]) Must call and do an interview for appointment. Sees Children / Accepts Medicaid  [facility]    [phone]  [address]    [place], [place]       [facility]  [phone] [address] [place], [place]  Evaluates for Autism but does not treat it Sees Children / Accepts Medicaid  [facility]    [phone] [address], [address]   [place], [place] Medication management, substance abuse, bipolar, grief, family, marriage, OCD, anxiety, PTSD Sees children / Accepts Medicaid  [facility]    [phone] [address] [place], [place] Sees children / Accepts [facility]  [facility]  [phone] [address] [place], [place]   Dr Kirkwood     [phone] [address] [place], [place]  Sees ADD & ADHD for treatment Accepts Medicaid  [facility]  [phone] [address] [place], [place] Evaluates for Autism Accepts [facility]  [facility]  [phone] [address] [place], [place]  Does Adult ADD evaluations Does not accept Medicaid  [facility]   [phone] [address] [place], [place] Uses animal therapy  Sees children as young as 41 years old Accepts [facility]     [phone]    [address]  [place], [place] [address] Sees children Accepts Medicaid   Goal BP:  Less than 130/80  Take your  medications faithfully as prescribed. Maintain a healthy weight. Get at least 150 minutes of aerobic exercise per week. Minimize salt intake, less than 2000 mg per day. Minimize alcohol intake.  DASH Eating Plan DASH stands for Dietary Approaches to Stop Hypertension. The DASH eating plan is a healthy eating plan that has been shown to reduce high blood pressure (hypertension). Additional health benefits may include reducing the risk of type 2 diabetes mellitus, heart disease, and stroke. The DASH eating plan may also help with weight loss.  WHAT DO I NEED TO KNOW ABOUT THE DASH EATING PLAN? For the DASH eating plan, you will follow these general guidelines: Choose foods with a percent daily value for sodium of less than 5% (as listed on the food label). Use salt-free seasonings or herbs instead of table salt or sea salt. Check with your health care provider or pharmacist before using salt substitutes. Eat lower-sodium products, often labeled as lower sodium or no salt added. Eat fresh foods. Eat more vegetables, fruits, and low-fat dairy products. Choose whole grains. Look for the word whole as the first word in the ingredient list. Choose fish and skinless chicken or turkey more often than red meat. Limit fish, poultry, and meat to 6 oz (170 g) each day. Limit sweets, desserts, sugars, and sugary drinks. Choose heart-healthy fats. Limit cheese to 1 oz (28 g) per day. Eat more home-cooked food and less restaurant, buffet, and fast food. Limit fried foods. Cook foods using methods other than frying. Limit canned vegetables. If you do use them, rinse them  well to decrease the sodium. When eating at a restaurant, ask that your food be prepared with less salt, or no salt if possible.  WHAT FOODS CAN I EAT? Seek help from a dietitian for individual calorie needs.  Grains Whole grain or whole wheat bread. Brown rice. Whole grain or whole wheat pasta. Quinoa, bulgur, and whole grain  cereals. Low-sodium cereals. Corn or whole wheat flour tortillas. Whole grain cornbread. Whole grain crackers. Low-sodium crackers.  Vegetables Fresh or frozen vegetables (raw, steamed, roasted, or grilled). Low-sodium or reduced-sodium tomato and vegetable juices. Low-sodium or reduced-sodium tomato sauce and paste. Low-sodium or reduced-sodium canned vegetables.   Fruits All fresh, canned (in natural juice), or frozen fruits.  Meat and Other Protein Products Ground beef (85% or leaner), grass-fed beef, or beef trimmed of fat. Skinless chicken or turkey. Ground chicken or turkey. Pork trimmed of fat. All fish and seafood. Eggs. Dried beans, peas, or lentils. Unsalted nuts and seeds. Unsalted canned beans.  Dairy Low-fat dairy products, such as skim or 1% milk, 2% or reduced-fat cheeses, low-fat ricotta or cottage cheese, or plain low-fat yogurt. Low-sodium or reduced-sodium cheeses.  Fats and Oils Tub margarines without trans fats. Light or reduced-fat mayonnaise and salad dressings (reduced sodium). Avocado. Safflower, olive, or canola oils. Natural peanut or almond butter.  Other Unsalted popcorn and pretzels. The items listed above may not be a complete list of recommended foods or beverages. Contact your dietitian for more options.  WHAT FOODS ARE NOT RECOMMENDED?  Grains White bread. White pasta. White rice. Refined cornbread. Bagels and croissants. Crackers that contain trans fat.  Vegetables Creamed or fried vegetables. Vegetables in a cheese sauce. Regular canned vegetables. Regular canned tomato sauce and paste. Regular tomato and vegetable juices.  Fruits Dried fruits. Canned fruit in light or heavy syrup. Fruit juice.  Meat and Other Protein Products Fatty cuts of meat. Ribs, chicken wings, bacon, sausage, bologna, salami, chitterlings, fatback, hot dogs, bratwurst, and packaged luncheon meats. Salted nuts and seeds. Canned beans with salt.  Dairy Whole or 2% milk,  cream, half-and-half, and cream cheese. Whole-fat or sweetened yogurt. Full-fat cheeses or blue cheese. Nondairy creamers and whipped toppings. Processed cheese, cheese spreads, or cheese curds.  Condiments Onion and garlic salt, seasoned salt, table salt, and sea salt. Canned and packaged gravies. Worcestershire sauce. Tartar sauce. Barbecue sauce. Teriyaki sauce. Soy sauce, including reduced sodium. Steak sauce. Fish sauce. Oyster sauce. Cocktail sauce. Horseradish. Ketchup and mustard. Meat flavorings and tenderizers. Bouillon cubes. Hot sauce. Tabasco sauce. Marinades. Taco seasonings. Relishes.  Fats and Oils Butter, stick margarine, lard, shortening, ghee, and bacon fat. Coconut, palm kernel, or palm oils. Regular salad dressings.  Other Pickles and olives. Salted popcorn and pretzels.  The items listed above may not be a complete list of foods and beverages to avoid. Contact your dietitian for more information.  WHERE CAN I FIND MORE INFORMATION? National Heart, Lung, and Blood Institute: cablepromo.it Document Released: 07/25/2011 Document Revised: 12/20/2013 Document Reviewed: 06/09/2013 [facility] Patient Information 2015 [place], [place]. This information is not intended to replace advice given to you by your health care provider. Make sure you discuss any questions you have with your health care provider.   I think that you would greatly benefit from seeing a nutritionist.  If you are interested, please call Dr. Wonda at [phone] to schedule an appointment.

## 2024-09-03 NOTE — Progress Notes (Signed)
 "  Complete physical exam  Patient: Anna Carney   DOB: [birth year]   74 y.o. Female  MRN: [id number]  Subjective:    Chief Complaint  Patient presents with   Annual Exam    Anna Carney is a 74 y.o. female who presents today for a complete physical exam. She reports consuming a general diet. The patient does not participate in regular exercise at present. She generally feels fairly well. She reports sleeping fairly well. She does have additional problems to discuss today.   She has hypertension and typically records blood pressure readings around 140 mmHg at home. She has not taken her blood pressure medication, losartan  50 mg, today. Usually, she checks her blood pressure a couple of hours after taking her medication, and it remains in the 140s. No headaches, chest pain, or leg swelling.  She has a history of breast cancer and has completed her oral medication regimen. She experiences persistent tinnitus. She is scheduled for blood work next Friday and sees her oncologist every six months.  She takes calcium, vitamin D , and vitamin B12 supplements. She has arthritis in her knees. A bone density test is scheduled.  She has been on Lexapro  since 2020 for depression and anxiety, which she feels may not be as effective recently. Her symptoms have worsened following the death of a family member, Anna Carney. She has not engaged in counseling despite her daughters' suggestions.  She has not had a recent eye exam due to personal circumstances but plans to schedule one this year. No changes in vision, hearing, or bladder habits. She has experienced shingles in the past but does not recall having chickenpox as a child.  She reports regular bowel movements and no dental issues. She is due for a Cologuard test later this year and has a mammogram scheduled for July.       Most recent fall risk assessment:    09/03/2024    8:14 AM  Fall Risk   Falls in the past year? 0  Risk for fall due to : No  Fall Risks  Follow up Falls evaluation completed     Most recent depression screenings:    09/03/2024    8:14 AM 03/18/2024    2:33 PM 03/03/2024    8:33 AM 11/20/2023    2:25 PM 09/04/2023    8:29 AM  Depression screen PHQ 2/9  Decreased Interest 0 0 0 0 1  Down, Depressed, Hopeless 2 0 0 0 1  PHQ - 2 Score 2 0 0 0 2  Altered sleeping 1 0 3 3 1   Tired, decreased energy 2 0 1 1 1   Change in appetite 2 0 2 1 1   Feeling bad or failure about yourself  0 0 0 0 0  Trouble concentrating 0 0 0 0 0  Moving slowly or fidgety/restless 0 0 0 0 0  Suicidal thoughts 0 0 0 0 0  PHQ-9 Score 7 0  6  5  5    Difficult doing work/chores Not difficult at all  Not difficult at all Not difficult at all Somewhat difficult     Data saved with a previous flowsheet row definition      03/03/2024    8:33 AM 11/20/2023    2:25 PM 09/04/2023    8:29 AM 05/07/2023   10:47 AM  GAD 7 : Generalized Anxiety Score  Nervous, Anxious, on Edge 1 0 0 0  Control/stop worrying 0 0 0 0  Worry too much - different things  0 0 0 0  Trouble relaxing 1 0 0 0  Restless 0 0 0 0  Easily annoyed or irritable 0 0 0 0  Afraid - awful might happen 0 0 0 0  Total GAD 7 Score 2 0 0 0  Anxiety Difficulty Not difficult at all Not difficult at all Not difficult at all        Vision:Not within last year  and Dental: No current dental problems  Patient Active Problem List   Diagnosis Date Noted   Vitamin B12 deficiency 09/03/2024   Sleep trouble 09/03/2024   Osteoporosis 03/17/2023   Vitamin D  deficiency 03/11/2023   Morbid obesity (HCC) 09/10/2022   Anxiety 10/20/2020   Depression, major, single episode, moderate (HCC) 10/20/2020   Essential hypertension 12/27/2019   Prophylactic use of agents affecting estrogen receptors or levels 05/17/2019   Malignant neoplasm of lower-outer quadrant of left breast of female, estrogen receptor positive (HCC) 10/27/2018   Chronic fatigue 10/22/2018   Primary osteoarthritis of knees,  bilateral 07/03/2017   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   Hypertension    Shingles    Past Surgical History:  Procedure Laterality Date   CHOLECYSTECTOMY     MASTECTOMY W/ SENTINEL NODE BIOPSY Left 11/12/2018   Procedure: LEFT MASTECTOMY WITH LEFT AXILLARY SENTINEL LYMPH NODE BIOPSY;  Surgeon: Ebbie Cough, MD;  Location: [facility];  Service: General;  Laterality: Left;   WRIST SURGERY Left    Social History[1] Social History   Socioeconomic History   Marital status: Single    Spouse name: Not on file   Number of children: Not on file   Years of education: Not on file   Highest education level: 12th grade  Occupational History   Occupation: [employer]    Comment: in [employer]   Occupation: [employer]    Comment: cafeteria and bus driver  Tobacco Use   Smoking status: Former    Current packs/day: 0.00    Types: Cigarettes    Quit date: 1994    Years since quitting: 32.0    Passive exposure: Never   Smokeless tobacco: Never  Vaping Use   Vaping status: Never Used  Substance and Sexual Activity   Alcohol use: Yes    Comment: rarely   Drug use: No   Sexual activity: Not Currently    Birth control/protection: Post-menopausal  Other Topics Concern   Not on file  Social History Narrative   Not on file   Social Drivers of Health   Tobacco Use: Medium Risk (09/03/2024)   Patient History    Smoking Tobacco Use: Former    Smokeless Tobacco Use: Never    Passive Exposure: Never  Physicist, Medical Strain: Low Risk (09/03/2023)   Overall Financial Resource Strain (CARDIA)    Difficulty of Paying Living Expenses: Not hard at all  Food Insecurity: No Food Insecurity (09/03/2023)   Hunger Vital Sign    Worried About Running Out of Food in the Last Year: Never true    Ran Out of Food in the Last Year: Never true  Transportation Needs: No  Transportation Needs (09/03/2023)   PRAPARE - Administrator, Civil Service (Medical): No    Lack of Transportation (Non-Medical): No  Physical Activity: Unknown (09/03/2023)   Exercise Vital Sign    Days of Exercise per Week: 0 days  Minutes of Exercise per Session: Not on file  Stress: No Stress Concern Present (09/03/2023)   [employer] of Occupational Health - Occupational Stress Questionnaire    Feeling of Stress : Only a little  Social Connections: Moderately Isolated (09/03/2023)   Social Connection and Isolation Panel    Frequency of Communication with Friends and Family: More than three times a week    Frequency of Social Gatherings with Friends and Family: Twice a week    Attends Religious Services: Never    Database Administrator or Organizations: No    Attends Engineer, Structural: Not on file    Marital Status: Living with partner  Intimate Partner Violence: Not on file  Depression (PHQ2-9): Medium Risk (09/03/2024)   Depression (PHQ2-9)    PHQ-2 Score: 7  Alcohol Screen: Not on file  Housing: Low Risk (09/03/2023)   Housing Stability Vital Sign    Unable to Pay for Housing in the Last Year: No    Number of Times Moved in the Last Year: 0    Homeless in the Last Year: No  Utilities: Not on file  Health Literacy: Not on file   Family Status  Relation Name Status   Mother  Deceased   Father  Deceased   Sister  Alive   Brother half Deceased   Brother  Alive   Daughter  Alive   Daughter step Alive   Mat Aunt  Alive  No partnership data on file   Family History  Problem Relation Age of Onset   Arthritis Mother    Diabetes Mother    Stroke Mother    Asthma Father    Colon cancer Brother    Arthritis Daughter    Thyroid  disease Daughter    Breast cancer Maternal Aunt    Allergies[2]    Patient Care Team: Severa Rock HERO, FNP as PCP - General (Family Medicine)   Show/hide medication list[3]  ROS per HPI     Objective:      BP (!) 148/88   Pulse 62   Temp (!) 97 F (36.1 C)   Ht 5' 4 (1.626 m)   Wt 248 lb 6.4 oz (112.7 kg)   SpO2 99%   BMI 42.64 kg/m  BP Readings from Last 3 Encounters:  09/03/24 (!) 148/88  03/18/24 135/83  03/03/24 128/74   Wt Readings from Last 3 Encounters:  09/03/24 248 lb 6.4 oz (112.7 kg)  03/18/24 239 lb 3.2 oz (108.5 kg)  03/03/24 236 lb (107 kg)   SpO2 Readings from Last 3 Encounters:  09/03/24 99%  03/18/24 95%  03/03/24 95%      Physical Exam Vitals and nursing note reviewed.  Constitutional:      General: She is not in acute distress.    Appearance: Normal appearance. She is well-developed and well-groomed. She is morbidly obese. She is not ill-appearing, toxic-appearing or diaphoretic.  HENT:     Head: Normocephalic and atraumatic.     Jaw: There is normal jaw occlusion.     Right Ear: Hearing, tympanic membrane, ear canal and external ear normal.     Left Ear: Hearing, tympanic membrane, ear canal and external ear normal.     Nose: Nose normal.     Mouth/Throat:     Lips: Pink.     Mouth: Mucous membranes are moist.     Pharynx: Oropharynx is clear. Uvula midline.  Eyes:     General: Lids are normal.  Extraocular Movements: Extraocular movements intact.     Conjunctiva/sclera: Conjunctivae normal.     Pupils: Pupils are equal, round, and reactive to light.  Neck:     Thyroid : No thyroid  mass, thyromegaly or thyroid  tenderness.     Vascular: No carotid bruit or JVD.     Trachea: Trachea and phonation normal.  Cardiovascular:     Rate and Rhythm: Normal rate and regular rhythm.     Chest Wall: PMI is not displaced.     Pulses: Normal pulses.     Heart sounds: Normal heart sounds. No murmur heard.    No friction rub. No gallop.  Pulmonary:     Effort: Pulmonary effort is normal. No respiratory distress.     Breath sounds: Normal breath sounds. No wheezing.  Abdominal:     General: Bowel sounds are normal. There is no distension or abdominal  bruit.     Palpations: Abdomen is soft. There is no hepatomegaly or splenomegaly.     Tenderness: There is no abdominal tenderness. There is no right CVA tenderness or left CVA tenderness.     Hernia: No hernia is present.  Musculoskeletal:        General: Normal range of motion.     Cervical back: Normal range of motion and neck supple.     Right lower leg: No edema.     Left lower leg: No edema.  Lymphadenopathy:     Cervical: No cervical adenopathy.  Skin:    General: Skin is warm and dry.     Capillary Refill: Capillary refill takes less than 2 seconds.     Coloration: Skin is not cyanotic, jaundiced or pale.     Findings: No rash.  Neurological:     General: No focal deficit present.     Mental Status: She is alert and oriented to person, place, and time.     Sensory: Sensation is intact.     Motor: Motor function is intact.     Coordination: Coordination is intact.     Gait: Gait is intact.     Deep Tendon Reflexes: Reflexes are normal and symmetric.  Psychiatric:        Attention and Perception: Attention and perception normal.        Mood and Affect: Mood and affect normal.        Speech: Speech normal.        Behavior: Behavior normal. Behavior is cooperative.        Thought Content: Thought content normal.        Cognition and Memory: Cognition and memory normal.        Judgment: Judgment normal.       Last CBC Lab Results  Component Value Date   WBC 6.0 03/11/2024   HGB 13.6 03/11/2024   HCT 41.5 03/11/2024   MCV 99.0 03/11/2024   MCH 32.5 03/11/2024   RDW 12.6 03/11/2024   PLT 154 03/11/2024   Last metabolic panel Lab Results  Component Value Date   GLUCOSE 101 (H) 03/11/2024   NA 136 03/11/2024   K 3.7 03/11/2024   CL 102 03/11/2024   CO2 27 03/11/2024   BUN 19 03/11/2024   CREATININE 0.79 03/11/2024   GFRNONAA >60 03/11/2024   CALCIUM 8.6 (L) 03/11/2024   PROT 7.0 03/11/2024   ALBUMIN 3.7 03/11/2024   LABGLOB 2.4 09/04/2023   AGRATIO 1.8  10/20/2020   BILITOT 1.1 03/11/2024   ALKPHOS 73 03/11/2024   AST 24 03/11/2024  ALT 18 03/11/2024   ANIONGAP 7 03/11/2024   Last lipids Lab Results  Component Value Date   CHOL 152 09/04/2023   HDL 60 09/04/2023   LDLCALC 75 09/04/2023   TRIG 89 09/04/2023   CHOLHDL 2.5 09/04/2023   Last hemoglobin A1c Lab Results  Component Value Date   HGBA1C 5.1 10/21/2018   Last thyroid  functions Lab Results  Component Value Date   TSH 1.450 09/04/2023   T4TOTAL 8.7 09/04/2023   Last vitamin D  Lab Results  Component Value Date   VD25OH 58.14 12/16/2023   Last vitamin B12 and Folate Lab Results  Component Value Date   VITAMINB12 696 09/04/2023   FOLATE 15.0 09/04/2023        Assessment & Plan:    Routine Health Maintenance and Physical Exam  Immunization History  Administered Date(s) Administered   Fluad Quad(high Dose 65+) 07/26/2020, 09/05/2021   Influenza-Unspecified 06/04/2017   PFIZER(Purple Top)SARS-COV-2 Vaccination 05/12/2020, 06/02/2020   PNEUMOCOCCAL CONJUGATE-20 09/05/2021   Tdap 03/05/2021    Health Maintenance  Topic Date Due   Influenza Vaccine  11/16/2024 (Originally 03/19/2024)   Hepatitis C Screening  11/19/2024 (Originally 05/05/1969)   Zoster Vaccines- Shingrix (1 of 2) 12/02/2024 (Originally [birth year]04/1972)   Medicare Annual Wellness (AWV)  11/19/2024   Bone Density Scan  03/09/2025   Mammogram  03/11/2025   Fecal DNA (Cologuard)  04/02/2025   DTaP/Tdap/Td (2 - Td or Tdap) 03/06/2031   Pneumococcal Vaccine: 50+ Years  Completed   Meningococcal B Vaccine  Aged Out   COVID-19 Vaccine  Discontinued    Discussed health benefits of physical activity, and encouraged her to engage in regular exercise appropriate for her age and condition.  Problem List Items Addressed This Visit       Cardiovascular and Mediastinum   Essential hypertension   Relevant Medications   losartan  (COZAAR ) 50 MG tablet   Other Relevant Orders   CBC with  Differential/Platelet   CMP14+EGFR   Lipid panel   TSH   T4, free     Musculoskeletal and Integument   Osteoporosis     Other   Malignant neoplasm of lower-outer quadrant of left breast of female, estrogen receptor positive (HCC)   Anxiety   Relevant Medications   traZODone  (DESYREL ) 50 MG tablet   Other Relevant Orders   Ambulatory referral to Psychiatry   Depression, major, single episode, moderate (HCC)   Relevant Medications   traZODone  (DESYREL ) 50 MG tablet   Other Relevant Orders   Ambulatory referral to Psychiatry   Morbid obesity (HCC)   Relevant Orders   Bayer DCA Hb A1c Waived   Vitamin D  deficiency   Relevant Orders   CMP14+EGFR   Vitamin D , 25-hydroxy   Vitamin B12 deficiency   Relevant Orders   CBC with Differential/Platelet   Vitamin B12   Sleep trouble   Relevant Medications   traZODone  (DESYREL ) 50 MG tablet   Other Relevant Orders   CBC with Differential/Platelet   CMP14+EGFR   TSH   T4, free   Other Visit Diagnoses       Annual physical exam    -  Primary     Screening for colorectal cancer       Relevant Orders   Cologuard     Need for hepatitis C screening test       Relevant Orders   Hepatitis C antibody         Essential hypertension Blood pressure is elevated at 148/88 mmHg. Home readings  typically around 140 mmHg. No side effects from losartan  50 mg. No symptoms such as headaches, chest pain, or leg swelling. - Increase losartan  dosage if blood pressure remains consistently above 130/80 mmHg. - Provided blood pressure log for home monitoring. - Scheduled follow-up in 3 months for blood pressure evaluation.  Major depressive disorder and anxiety Depression and anxiety may not be well-controlled with current Lexapro  regimen. Symptoms worsened after the death of a family member. No recent counseling. - Referred to counseling services for evaluation and management. - Provided list of counseling resources.  Malignant neoplasm of  left breast, status post treatment Completed treatment for left breast cancer. Follow-up with oncologist scheduled. - Continue regular follow-up with oncologist every 6 months. - Proceed with scheduled blood work next Friday.  Age-related osteoporosis Ongoing management with calcium and vitamin D  supplementation. Bone density test scheduled. - Continue calcium and vitamin D  supplementation. - Proceed with scheduled bone density test.  Vitamin D  and B12 deficiency Ongoing supplementation with vitamin D  and B12. No new symptoms reported. - Continue vitamin D  and B12 supplementation.  Screening for colorectal cancer Cologuard screening due this year. - Ordered Cologuard test for colorectal cancer screening.       Return in about 3 months (around 12/02/2024) for HTN.     Anna Bruns, FNP      [1]  Social History Tobacco Use   Smoking status: Former    Current packs/day: 0.00    Types: Cigarettes    Quit date: 1994    Years since quitting: 32.0    Passive exposure: Never   Smokeless tobacco: Never  Vaping Use   Vaping status: Never Used  Substance Use Topics   Alcohol use: Yes    Comment: rarely   Drug use: No  [2]  Allergies Allergen Reactions   Sulfa  Antibiotics Nausea Only and Other (See Comments)  [3]  Outpatient Medications Prior to Visit  Medication Sig   acetaminophen  (TYLENOL ) 325 MG tablet Take 650 mg by mouth every 6 (six) hours as needed.   Ascorbic Acid (VITAMIN C) 1000 MG tablet Take 2,000 mg by mouth daily.   calcium carbonate (OS-CAL) 1250 (500 Ca) MG chewable tablet Chew 1 tablet by mouth 2 (two) times daily.    cholecalciferol (VITAMIN D3) 25 MCG (1000 UT) tablet Take 2,000 Units by mouth daily.   Cyanocobalamin ([employer] B12 GUMMIES) 500 MCG CHEW Chew by mouth.   Docusate Calcium (STOOL SOFTENER PO) Take by mouth.   escitalopram  (LEXAPRO ) 20 MG tablet Take 1 tablet by mouth once daily   Multiple Vitamin (MULTIVITAMIN) tablet Take 1 tablet by mouth  daily.   [DISCONTINUED] losartan  (COZAAR ) 50 MG tablet Take 1 tablet by mouth once daily   [DISCONTINUED] traZODone  (DESYREL ) 50 MG tablet Take 0.5-1 tablets (25-50 mg total) by mouth at bedtime as needed for sleep.   No facility-administered medications prior to visit.   "

## 2024-09-04 LAB — TSH: TSH: 0.928 u[IU]/mL (ref 0.450–4.500)

## 2024-09-04 LAB — CBC WITH DIFFERENTIAL/PLATELET
Basophils Absolute: 0.1 x10E3/uL (ref 0.0–0.2)
Basos: 1 %
EOS (ABSOLUTE): 0.2 x10E3/uL (ref 0.0–0.4)
Eos: 4 %
Hematocrit: 43.4 % (ref 34.0–46.6)
Hemoglobin: 14.1 g/dL (ref 11.1–15.9)
Immature Grans (Abs): 0 x10E3/uL (ref 0.0–0.1)
Immature Granulocytes: 0 %
Lymphocytes Absolute: 1.3 x10E3/uL (ref 0.7–3.1)
Lymphs: 24 %
MCH: 32.3 pg (ref 26.6–33.0)
MCHC: 32.5 g/dL (ref 31.5–35.7)
MCV: 99 fL — ABNORMAL HIGH (ref 79–97)
Monocytes Absolute: 0.5 x10E3/uL (ref 0.1–0.9)
Monocytes: 9 %
Neutrophils Absolute: 3.4 x10E3/uL (ref 1.4–7.0)
Neutrophils: 62 %
Platelets: 164 x10E3/uL (ref 150–450)
RBC: 4.37 x10E6/uL (ref 3.77–5.28)
RDW: 12.3 % (ref 11.7–15.4)
WBC: 5.4 x10E3/uL (ref 3.4–10.8)

## 2024-09-04 LAB — LIPID PANEL
Chol/HDL Ratio: 2.3 ratio (ref 0.0–4.4)
Cholesterol, Total: 152 mg/dL (ref 100–199)
HDL: 66 mg/dL
LDL Chol Calc (NIH): 73 mg/dL (ref 0–99)
Triglycerides: 67 mg/dL (ref 0–149)
VLDL Cholesterol Cal: 13 mg/dL (ref 5–40)

## 2024-09-04 LAB — CMP14+EGFR
ALT: 16 IU/L (ref 0–32)
AST: 25 IU/L (ref 0–40)
Albumin: 4.3 g/dL (ref 3.8–4.8)
Alkaline Phosphatase: 80 IU/L (ref 49–135)
BUN/Creatinine Ratio: 15 (ref 12–28)
BUN: 12 mg/dL (ref 8–27)
Bilirubin Total: 0.6 mg/dL (ref 0.0–1.2)
CO2: 24 mmol/L (ref 20–29)
Calcium: 9.3 mg/dL (ref 8.7–10.3)
Chloride: 104 mmol/L (ref 96–106)
Creatinine, Ser: 0.82 mg/dL (ref 0.57–1.00)
Globulin, Total: 2.3 g/dL (ref 1.5–4.5)
Glucose: 100 mg/dL — ABNORMAL HIGH (ref 70–99)
Potassium: 4.4 mmol/L (ref 3.5–5.2)
Sodium: 144 mmol/L (ref 134–144)
Total Protein: 6.6 g/dL (ref 6.0–8.5)
eGFR: 76 mL/min/1.73

## 2024-09-04 LAB — VITAMIN D 25 HYDROXY (VIT D DEFICIENCY, FRACTURES): Vit D, 25-Hydroxy: 39.6 ng/mL (ref 30.0–100.0)

## 2024-09-04 LAB — HEPATITIS C ANTIBODY: Hep C Virus Ab: NONREACTIVE

## 2024-09-04 LAB — VITAMIN B12: Vitamin B-12: 668 pg/mL (ref 232–1245)

## 2024-09-04 LAB — T4, FREE: Free T4: 1.37 ng/dL (ref 0.82–1.77)

## 2024-09-10 ENCOUNTER — Inpatient Hospital Stay: Attending: Oncology

## 2024-09-10 DIAGNOSIS — C50512 Malignant neoplasm of lower-outer quadrant of left female breast: Secondary | ICD-10-CM

## 2024-09-10 LAB — CBC WITH DIFFERENTIAL/PLATELET
Abs Immature Granulocytes: 0.02 K/uL (ref 0.00–0.07)
Basophils Absolute: 0.1 K/uL (ref 0.0–0.1)
Basophils Relative: 1 %
Eosinophils Absolute: 0.2 K/uL (ref 0.0–0.5)
Eosinophils Relative: 4 %
HCT: 42.1 % (ref 36.0–46.0)
Hemoglobin: 13.7 g/dL (ref 12.0–15.0)
Immature Granulocytes: 0 %
Lymphocytes Relative: 25 %
Lymphs Abs: 1.5 K/uL (ref 0.7–4.0)
MCH: 31.6 pg (ref 26.0–34.0)
MCHC: 32.5 g/dL (ref 30.0–36.0)
MCV: 97.2 fL (ref 80.0–100.0)
Monocytes Absolute: 0.5 K/uL (ref 0.1–1.0)
Monocytes Relative: 8 %
Neutro Abs: 3.6 K/uL (ref 1.7–7.7)
Neutrophils Relative %: 62 %
Platelets: 170 K/uL (ref 150–400)
RBC: 4.33 MIL/uL (ref 3.87–5.11)
RDW: 12.9 % (ref 11.5–15.5)
WBC: 5.8 K/uL (ref 4.0–10.5)
nRBC: 0 % (ref 0.0–0.2)

## 2024-09-10 LAB — COMPREHENSIVE METABOLIC PANEL WITH GFR
ALT: 14 U/L (ref 0–44)
AST: 23 U/L (ref 15–41)
Albumin: 4.2 g/dL (ref 3.5–5.0)
Alkaline Phosphatase: 77 U/L (ref 38–126)
Anion gap: 10 (ref 5–15)
BUN: 21 mg/dL (ref 8–23)
CO2: 28 mmol/L (ref 22–32)
Calcium: 9.3 mg/dL (ref 8.9–10.3)
Chloride: 102 mmol/L (ref 98–111)
Creatinine, Ser: 0.79 mg/dL (ref 0.44–1.00)
GFR, Estimated: >60 mL/min
Glucose, Bld: 93 mg/dL (ref 70–99)
Potassium: 3.9 mmol/L (ref 3.5–5.1)
Sodium: 140 mmol/L (ref 135–145)
Total Bilirubin: 0.6 mg/dL (ref 0.0–1.2)
Total Protein: 7 g/dL (ref 6.5–8.1)

## 2024-09-17 ENCOUNTER — Ambulatory Visit: Admitting: Oncology

## 2024-09-21 ENCOUNTER — Inpatient Hospital Stay: Admitting: Oncology

## 2024-09-21 NOTE — Assessment & Plan Note (Addendum)
-   Secondary to aromatase inhibitor.  She was switched to tamoxifen  July 2024 due to worsening bone health with a T-score of -2.8. -She was unable to tolerate tamoxifen  due to frequent falling.  She stopped this in January 2025. -Recommend calcium and vitamin D  and weightbearing exercises. -Recheck her bone density in 2 years which would be July 2026.

## 2024-09-21 NOTE — Assessment & Plan Note (Addendum)
-   Labs: Normal LFTs and creatinine.  CBC grossly normal. - Right breast mammogram (03/11/24): BI-RADS Category 1.  Repeat mammogram in July 2026. - She was switched from anastrozole  to tamoxifen  due to worsening bone health back in July 2024.  Tamoxifen  was stopped back in January 2025 due to frequent falls.  We discussed BCI testing but given her tumor type, it was rejected.  She was scheduled to stop tamoxifen  in March 2025. - She is currently not on any type of antihormone medication. -Recommend continued close monitoring with annual mammograms and every 56-month breast exam.  Given, she is no longer on an antihormone medication, she can be released back to her PCP after her next visit.

## 2024-12-02 ENCOUNTER — Ambulatory Visit: Admitting: Family Medicine

## 2025-09-06 ENCOUNTER — Encounter: Admitting: Family Medicine
